# Patient Record
Sex: Female | Born: 1945 | Race: Black or African American | Hispanic: No | Marital: Single | Smoking: Current every day smoker
Health system: Southern US, Community
[De-identification: ages and names within clinical notes are randomized; demographics above are authoritative.]

## PROBLEM LIST (undated history)

## (undated) DIAGNOSIS — N189 Chronic kidney disease, unspecified: Secondary | ICD-10-CM

## (undated) DIAGNOSIS — C801 Malignant (primary) neoplasm, unspecified: Secondary | ICD-10-CM

## (undated) DIAGNOSIS — R1310 Dysphagia, unspecified: Secondary | ICD-10-CM

## (undated) DIAGNOSIS — Z5189 Encounter for other specified aftercare: Secondary | ICD-10-CM

## (undated) DIAGNOSIS — K219 Gastro-esophageal reflux disease without esophagitis: Secondary | ICD-10-CM

## (undated) DIAGNOSIS — J449 Chronic obstructive pulmonary disease, unspecified: Secondary | ICD-10-CM

## (undated) DIAGNOSIS — R0602 Shortness of breath: Secondary | ICD-10-CM

## (undated) DIAGNOSIS — IMO0001 Reserved for inherently not codable concepts without codable children: Secondary | ICD-10-CM

## (undated) DIAGNOSIS — I10 Essential (primary) hypertension: Secondary | ICD-10-CM

## (undated) DIAGNOSIS — E7800 Pure hypercholesterolemia, unspecified: Secondary | ICD-10-CM

## (undated) HISTORY — PX: MASTECTOMY: SHX3

## (undated) HISTORY — PX: BREAST SURGERY: SHX581

## (undated) HISTORY — PX: TONSILLECTOMY: SUR1361

---

## 2005-10-03 ENCOUNTER — Ambulatory Visit: Payer: Self-pay

## 2005-10-27 ENCOUNTER — Ambulatory Visit: Payer: Self-pay

## 2005-11-26 ENCOUNTER — Ambulatory Visit: Payer: Self-pay

## 2009-12-12 ENCOUNTER — Encounter: Payer: Self-pay | Admitting: Internal Medicine

## 2009-12-27 ENCOUNTER — Encounter: Payer: Self-pay | Admitting: Internal Medicine

## 2010-01-26 ENCOUNTER — Encounter: Payer: Self-pay | Admitting: Internal Medicine

## 2011-03-01 ENCOUNTER — Encounter: Payer: Self-pay | Admitting: *Deleted

## 2011-03-01 ENCOUNTER — Emergency Department (HOSPITAL_COMMUNITY): Payer: Medicaid Other

## 2011-03-01 ENCOUNTER — Inpatient Hospital Stay (HOSPITAL_COMMUNITY)
Admission: EM | Admit: 2011-03-01 | Discharge: 2011-03-03 | DRG: 390 | Disposition: A | Discharge status: Home / Self Care | Payer: Medicaid Other | Attending: Internal Medicine | Admitting: Internal Medicine

## 2011-03-01 DIAGNOSIS — Z720 Tobacco use: Secondary | ICD-10-CM | POA: Diagnosis present

## 2011-03-01 DIAGNOSIS — E785 Hyperlipidemia, unspecified: Secondary | ICD-10-CM | POA: Diagnosis present

## 2011-03-01 DIAGNOSIS — K56609 Unspecified intestinal obstruction, unspecified as to partial versus complete obstruction: Secondary | ICD-10-CM | POA: Diagnosis present

## 2011-03-01 DIAGNOSIS — F17200 Nicotine dependence, unspecified, uncomplicated: Secondary | ICD-10-CM | POA: Diagnosis present

## 2011-03-01 DIAGNOSIS — I10 Essential (primary) hypertension: Secondary | ICD-10-CM | POA: Diagnosis present

## 2011-03-01 DIAGNOSIS — E119 Type 2 diabetes mellitus without complications: Secondary | ICD-10-CM | POA: Diagnosis present

## 2011-03-01 DIAGNOSIS — K219 Gastro-esophageal reflux disease without esophagitis: Secondary | ICD-10-CM | POA: Diagnosis present

## 2011-03-01 HISTORY — DX: Shortness of breath: R06.02

## 2011-03-01 HISTORY — DX: Essential (primary) hypertension: I10

## 2011-03-01 HISTORY — DX: Gastro-esophageal reflux disease without esophagitis: K21.9

## 2011-03-01 HISTORY — DX: Pure hypercholesterolemia, unspecified: E78.00

## 2011-03-01 HISTORY — DX: Reserved for inherently not codable concepts without codable children: IMO0001

## 2011-03-01 HISTORY — DX: Encounter for other specified aftercare: Z51.89

## 2011-03-01 HISTORY — DX: Chronic kidney disease, unspecified: N18.9

## 2011-03-01 HISTORY — DX: Malignant (primary) neoplasm, unspecified: C80.1

## 2011-03-01 LAB — URINALYSIS, ROUTINE W REFLEX MICROSCOPIC
Bilirubin Urine: NEGATIVE
Glucose, UA: NEGATIVE mg/dL
Hgb urine dipstick: NEGATIVE
Ketones, ur: NEGATIVE mg/dL
Leukocytes, UA: NEGATIVE
Nitrite: NEGATIVE
Protein, ur: NEGATIVE mg/dL
Specific Gravity, Urine: 1.025 (ref 1.005–1.030)
Urobilinogen, UA: 0.2 mg/dL (ref 0.0–1.0)
pH: 6 (ref 5.0–8.0)

## 2011-03-01 LAB — COMPREHENSIVE METABOLIC PANEL
ALT: 9 U/L (ref 0–35)
AST: 17 U/L (ref 0–37)
Albumin: 4 g/dL (ref 3.5–5.2)
Alkaline Phosphatase: 96 U/L (ref 39–117)
BUN: 8 mg/dL (ref 6–23)
CO2: 20 mEq/L (ref 19–32)
Calcium: 10.1 mg/dL (ref 8.4–10.5)
Chloride: 100 mEq/L (ref 96–112)
Creatinine, Ser: 0.69 mg/dL (ref 0.50–1.10)
GFR calc Af Amer: >60 mL/min (ref >60)
GFR calc non Af Amer: >60 mL/min (ref >60)
Glucose, Bld: 135 mg/dL — ABNORMAL HIGH (ref 70–99)
Potassium: 3.8 mEq/L (ref 3.5–5.1)
Sodium: 137 mEq/L (ref 135–145)
Total Bilirubin: 0.3 mg/dL (ref 0.3–1.2)
Total Protein: 8.6 g/dL — ABNORMAL HIGH (ref 6.0–8.3)

## 2011-03-01 LAB — DIFFERENTIAL
Basophils Absolute: 0 10*3/uL (ref 0.0–0.1)
Basophils Relative: 0 % (ref 0–1)
Eosinophils Absolute: 0.1 10*3/uL (ref 0.0–0.7)
Eosinophils Relative: 1 % (ref 0–5)
Lymphocytes Relative: 34 % (ref 12–46)
Lymphs Abs: 2.8 10*3/uL (ref 0.7–4.0)
Monocytes Absolute: 0.5 10*3/uL (ref 0.1–1.0)
Monocytes Relative: 6 % (ref 3–12)
Neutro Abs: 4.8 10*3/uL (ref 1.7–7.7)
Neutrophils Relative %: 59 % (ref 43–77)

## 2011-03-01 LAB — CBC
HCT: 40.6 % (ref 36.0–46.0)
Hemoglobin: 13.8 g/dL (ref 12.0–15.0)
MCH: 29.1 pg (ref 26.0–34.0)
MCHC: 34 g/dL (ref 30.0–36.0)
MCV: 85.7 fL (ref 78.0–100.0)
Platelets: 299 10*3/uL (ref 150–400)
RBC: 4.74 MIL/uL (ref 3.87–5.11)
RDW: 13.6 % (ref 11.5–15.5)
WBC: 8.2 10*3/uL (ref 4.0–10.5)

## 2011-03-01 LAB — LIPASE, BLOOD: Lipase: 83 U/L — ABNORMAL HIGH (ref 11–59)

## 2011-03-01 LAB — LACTIC ACID, PLASMA: Lactic Acid, Venous: 0.7 mmol/L (ref 0.5–2.2)

## 2011-03-01 IMAGING — CT CT ABD-PELV W/O CM
2 of 3 series · 16 of 46 positions shown, 18 images · non-contrast
Comparison: None

CLINICAL DATA: Mid abdominal pain, history breast cancer

CT ABDOMEN AND PELVIS WITHOUT CONTRAST
TECHNIQUE: Multidetector CT imaging of the abdomen and pelvis was
performed following the standard protocol without intravenous
contrast. Sagittal and coronal MPR images reconstructed from axial
data set.

[Series 2: standard/full over (age)lbs 5.0 · axial · 0.66mm/px · z∈[-364,-24]mm · 13 of 80 slices shown, 15 images]
[im 6/80  soft-tissue]
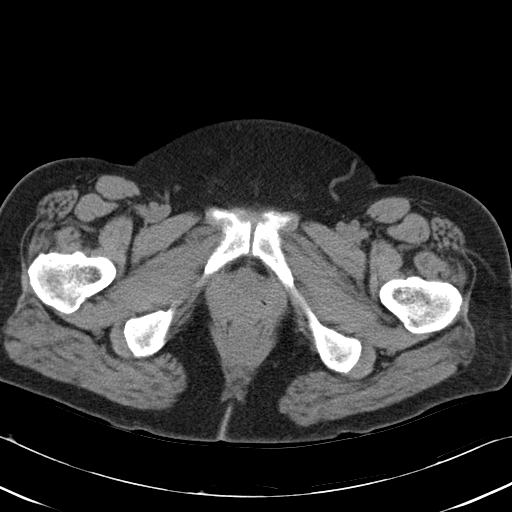
[im 6/80  bone]
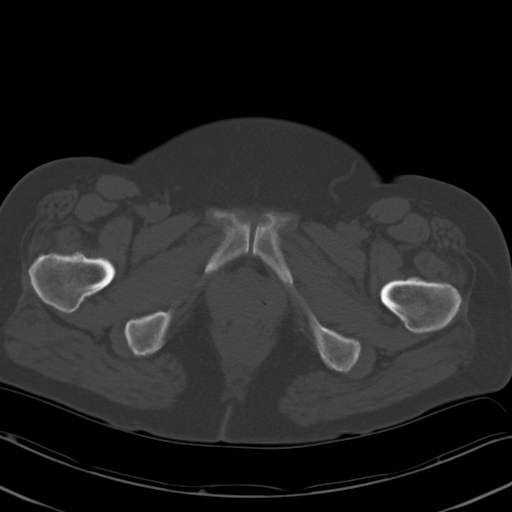
[im 11/80  soft-tissue]
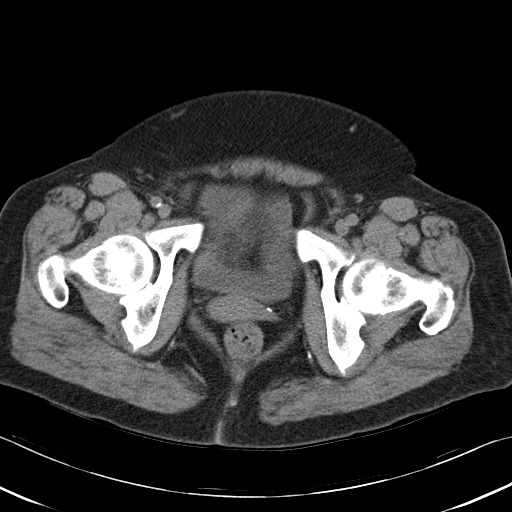
[im 16/80  soft-tissue]
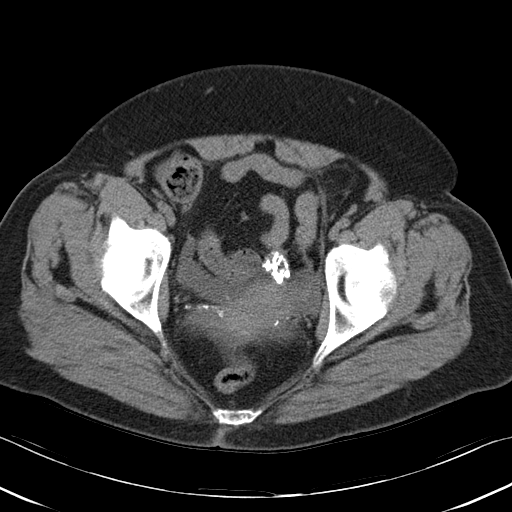
[im 23/80  soft-tissue]
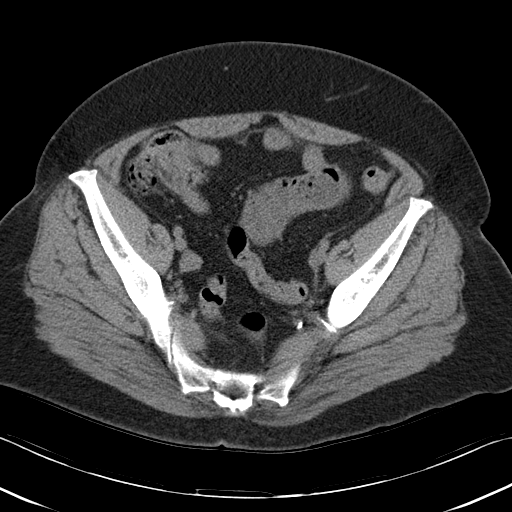
[im 29/80  soft-tissue]
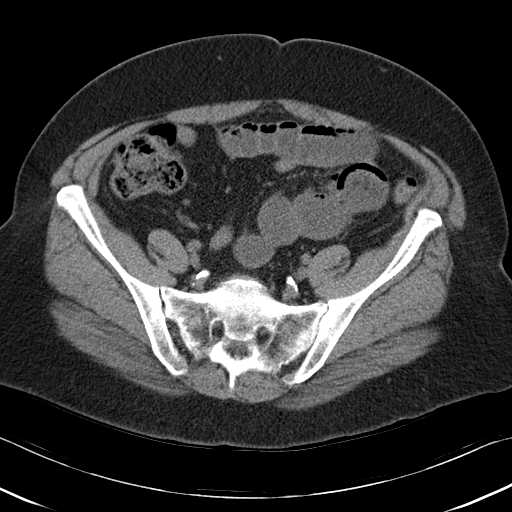
[im 34/80  soft-tissue]
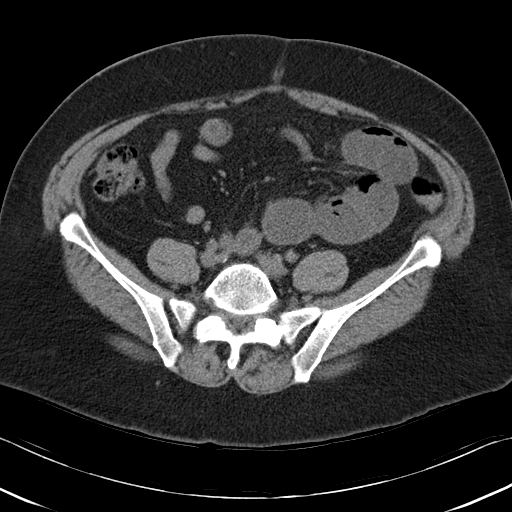
[im 41/80  soft-tissue]
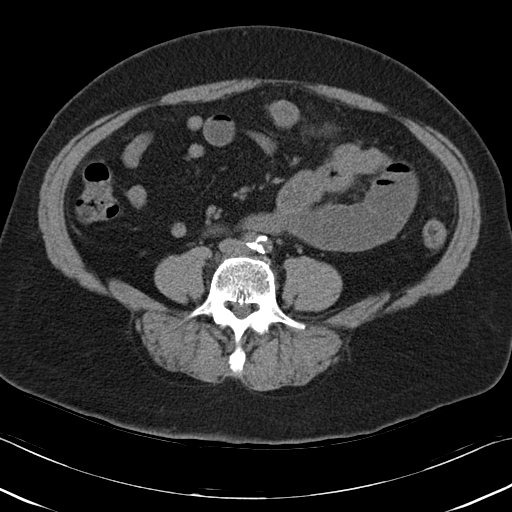
[im 46/80  soft-tissue]
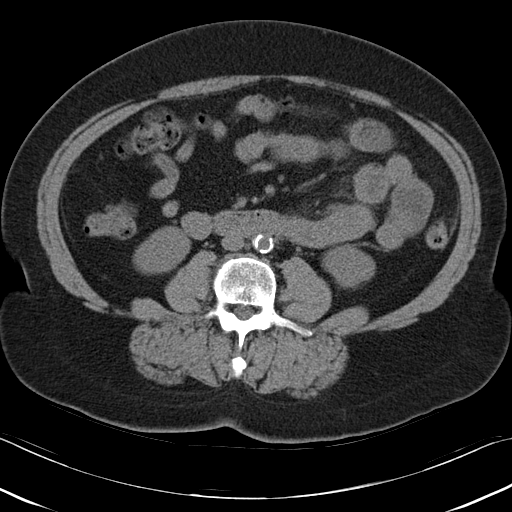
[im 51/80  soft-tissue]
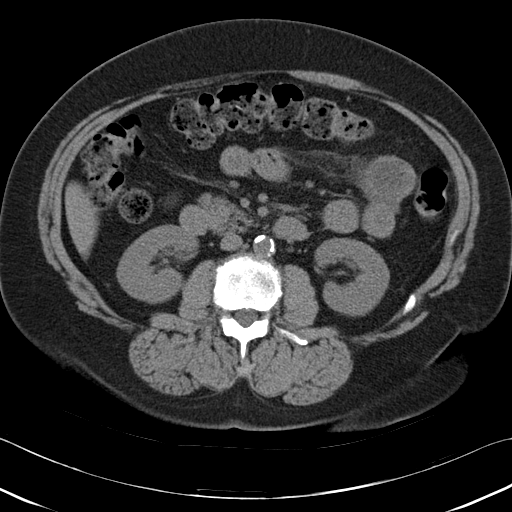
[im 51/80  bone]
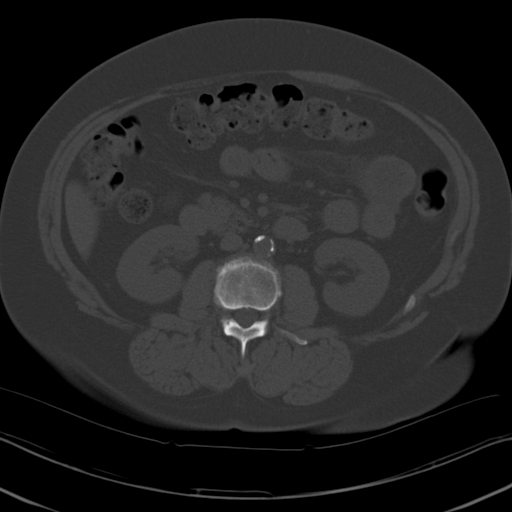
[im 57/80  soft-tissue]
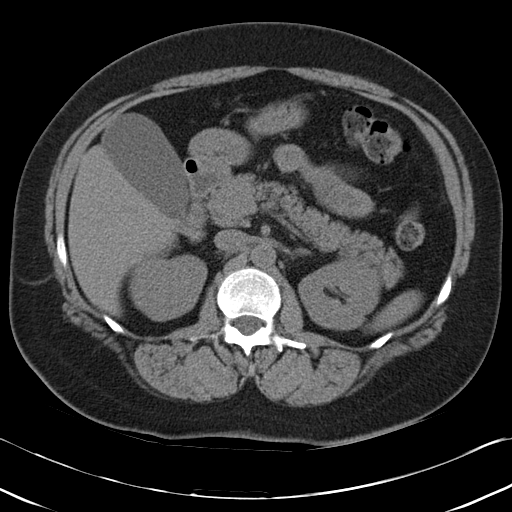
[im 64/80  soft-tissue]
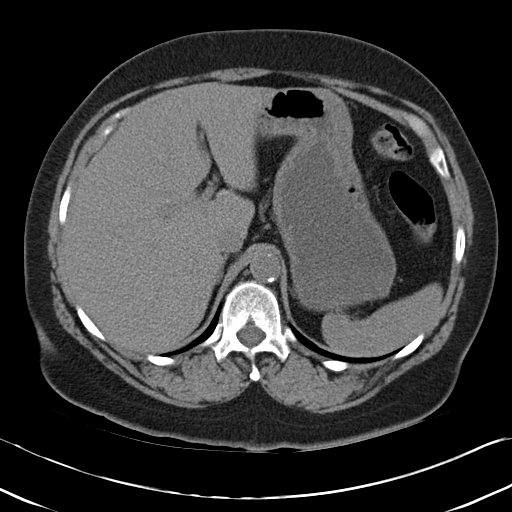
[im 69/80  soft-tissue]
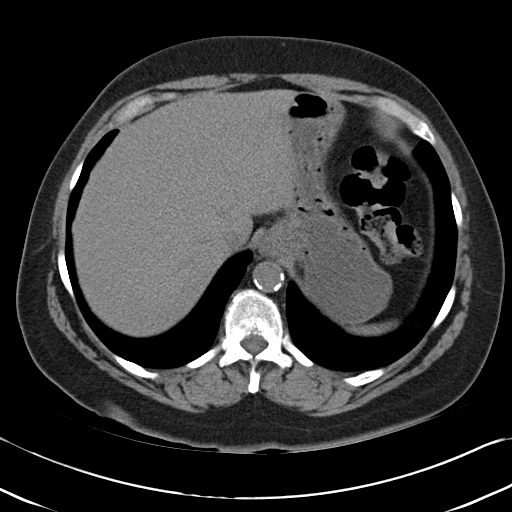
[im 74/80  soft-tissue]
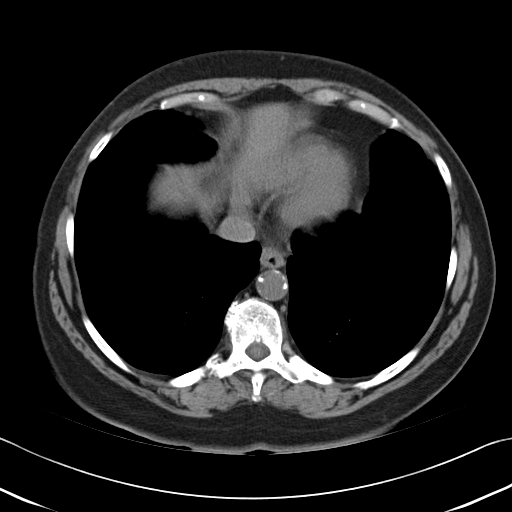

[Series 4: mpr coronal · coronal · 0.78mm/px · 3 of 86 slices shown]
[im 29/86  soft-tissue]
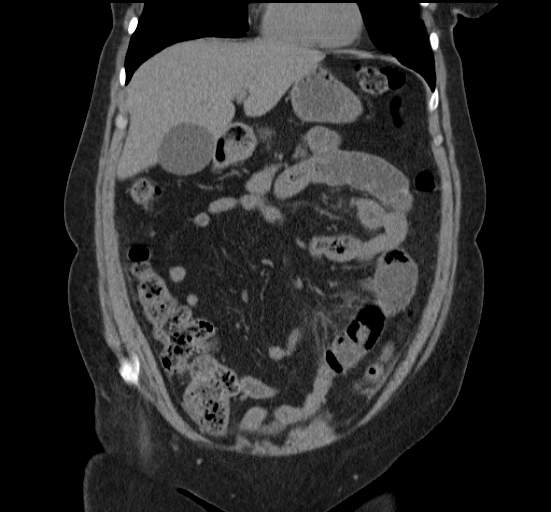
[im 38/86  soft-tissue]
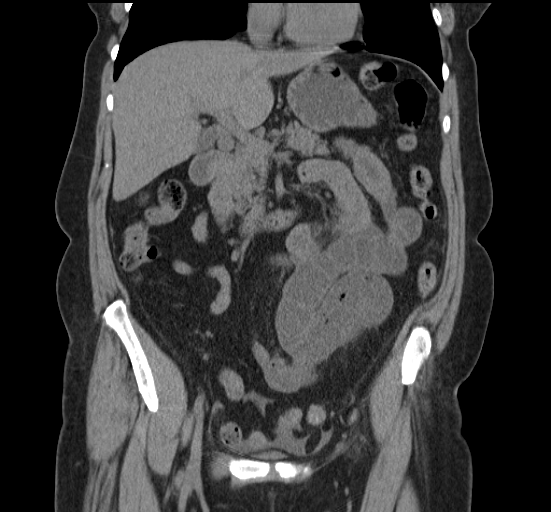
[im 48/86  soft-tissue]
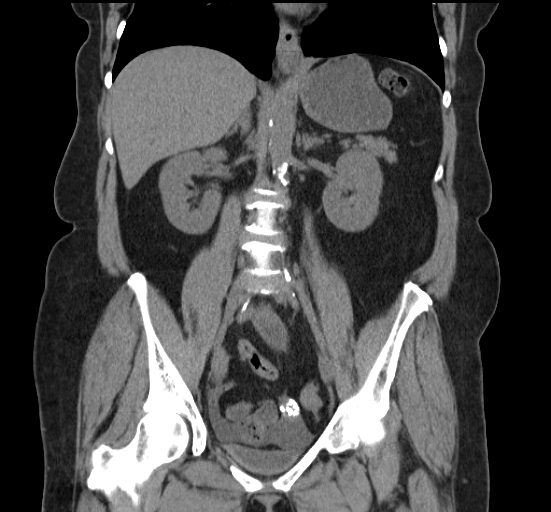

[16 of 46 positions shown; findings below may reference images not displayed]

FINDINGS: Post right mastectomy.
Linear scarring left lung base.
Scattered atherosclerotic calcifications aorta, iliac arteries,
left renal artery.
Small hiatal hernia.
Within limits of a nonenhanced exam, no focal abnormalities of the
liver, spleen, pancreas, kidneys, or adrenal glands.
Appendix, colon, and remainder stomach unremarkable.
Dilated small bowel loops in the left mid abdomen and upper left
pelvis, up to 3.3 cm diameter.
Several of the involved small bowel loops show peri-intestinal
mesenteric edema.
Findings are most compatible with small bowel obstruction, proximal
to mid small bowel, with distal small bowel loops normal
caliber/decompressed.
Exophytic calcified leiomyoma from left superior aspect of uterus,
1.9 cm diameter.
Unremarkable bladder.
Nonspecific free pelvic fluid.
No additional mass, adenopathy, or hernia.
No acute osseous findings.
IMPRESSION: Dilated small bowel loops in left mid abdomen and upper pelvis with
adjacent edema in the small bowel mesentery.
Findings are most consistent with small bowel obstruction, though I
do not see evidence of prior abdominal surgery to account for
adhesions as etiology.
Other etiologies could include internal hernia, closed loop
obstruction, hernia, or small bowel volvulus, though I do not see
specific evidence of these.
Small bowel ischemia could cause a similar pattern and should also
be considered; the patient does demonstrates extensive
atherosclerotic calcification in the abdominal aorta.
Nonspecific free pelvic fluid without evidence of free
air/perforation/abscess.
Small hiatal hernia.

Critical Value/emergent results were discussed in person at the
time of interpretation on [DATE]  at [HB] hours  with  Dr.
BLONDINACKA, who verbally acknowledged these results.

## 2011-03-01 MED ORDER — HYDROMORPHONE HCL 1 MG/ML IJ SOLN
INTRAMUSCULAR | Status: AC
  Filled 2011-03-01: qty 1

## 2011-03-01 MED ORDER — ENOXAPARIN SODIUM 80 MG/0.8ML ~~LOC~~ SOLN
40.0000 mg | SUBCUTANEOUS | Status: DC
  Administered 2011-03-02: 40 mg via SUBCUTANEOUS

## 2011-03-01 MED ORDER — HYDROMORPHONE HCL 1 MG/ML IJ SOLN
1.0000 mg | Freq: Once | INTRAMUSCULAR | Status: AC
  Administered 2011-03-01: 1 mg via INTRAVENOUS
  Filled 2011-03-01: qty 1

## 2011-03-01 MED ORDER — PANTOPRAZOLE SODIUM 40 MG IV SOLR
40.0000 mg | INTRAVENOUS | Status: DC
  Administered 2011-03-02: 40 mg via INTRAVENOUS

## 2011-03-01 MED ORDER — SODIUM CHLORIDE 0.9 % IV SOLN
999.0000 mL | INTRAVENOUS | Status: DC
  Administered 2011-03-01 (×2): via INTRAVENOUS

## 2011-03-01 MED ORDER — ONDANSETRON HCL 4 MG/2ML IJ SOLN
4.0000 mg | Freq: Three times a day (TID) | INTRAMUSCULAR | Status: DC | PRN
  Administered 2011-03-02 (×2): 4 mg via INTRAVENOUS
  Filled 2011-03-01 (×2): qty 2

## 2011-03-01 MED ORDER — ONDANSETRON HCL 4 MG/2ML IJ SOLN
4.0000 mg | Freq: Once | INTRAMUSCULAR | Status: AC
  Administered 2011-03-01: 4 mg via INTRAVENOUS
  Filled 2011-03-01: qty 2

## 2011-03-01 MED ORDER — HYDROMORPHONE HCL 1 MG/ML IJ SOLN
1.0000 mg | INTRAMUSCULAR | Status: DC | PRN
  Administered 2011-03-02 (×4): 1 mg via INTRAVENOUS
  Filled 2011-03-01 (×3): qty 1

## 2011-03-01 MED ORDER — SODIUM CHLORIDE 0.9 % IV BOLUS (SEPSIS): 1000.0000 mL | Freq: Once | INTRAVENOUS | Status: DC

## 2011-03-01 MED ORDER — MORPHINE SULFATE 2 MG/ML IJ SOLN
1.0000 mg | INTRAMUSCULAR | Status: DC | PRN
  Administered 2011-03-02 (×2): 1 mg via INTRAVENOUS
  Filled 2011-03-01 (×2): qty 1

## 2011-03-01 MED ORDER — SODIUM CHLORIDE 0.9 % IV SOLN
INTRAVENOUS | Status: DC
  Administered 2011-03-02: 18:00:00 via INTRAVENOUS

## 2011-03-01 MED ORDER — INSULIN ASPART 100 UNIT/ML ~~LOC~~ SOLN
0.0000 [IU] | SUBCUTANEOUS | Status: DC
  Filled 2011-03-01: qty 10

## 2011-03-01 MED ORDER — NICOTINE 21 MG/24HR TD PT24
21.0000 mg | MEDICATED_PATCH | Freq: Every day | TRANSDERMAL | Status: DC
  Administered 2011-03-02 – 2011-03-03 (×2): 21 mg via TRANSDERMAL
  Filled 2011-03-01 (×2): qty 1

## 2011-03-01 MED ORDER — SODIUM CHLORIDE 0.9 % IV SOLN
INTRAVENOUS | Status: DC
  Administered 2011-03-02: 08:00:00 via INTRAVENOUS

## 2011-03-01 MED ORDER — HYDROMORPHONE HCL 1 MG/ML IJ SOLN
1.0000 mg | Freq: Once | INTRAMUSCULAR | Status: AC
  Administered 2011-03-01: 1 mg via INTRAVENOUS

## 2011-03-01 NOTE — ED Notes (Signed)
Pt. Reports abd pain returning-rates 7/10; EDP notified and orders rec'd; medicated as ordered; Hospitalist at bedside to examine.

## 2011-03-01 NOTE — ED Notes (Signed)
C/o onset of diffuse, intermittent abd pain last night associated with n/v; reports pain, "just hurts", and rates as 9/10; a&ox4; answers questions appropriately; IV access obtained and labs drawn; NS infusing w/o difficulty at 523ml/hr; medicated as ordered for pain and nausea.

## 2011-03-01 NOTE — ED Provider Notes (Signed)
History     CSN: [id number] Arrival date & time: 03/01/2011  3:52 PM  Chief Complaint  Patient presents with  . Abdominal Pain  . Emesis   Patient is a 65 y.o. female presenting with abdominal pain and vomiting. The history is provided by the patient.  Abdominal Pain The primary symptoms of the illness include abdominal pain and vomiting. The current episode started yesterday. The onset of the illness was gradual. Progression since onset: Pain is waxing and waning. Pain is rated at 9/10 currently, but was 10/10 at it's worst.  The emesis contains stomach contents.  Change in bowel habit: She ahs been able to pass a small amount of flatus, but no bowel movement since onset of pain. Additional symptoms associated with the illness include frequency and back pain. Associated symptoms comments: She has no history of abdominal surgery..  Emesis  Associated symptoms include abdominal pain.    Past Medical History  Diagnosis Date  . Diabetes mellitus   . Cancer   . High cholesterol     Past Surgical History  Procedure Date  . Mastectomy right side  . Tonsillectomy     No family history on file.  History  Substance Use Topics  . Smoking status: Current Everyday Smoker  . Smokeless tobacco: Not on file  . Alcohol Use: No    OB History    Grav Para Term Preterm Abortions TAB SAB Ect Mult Living                  Review of Systems  Gastrointestinal: Positive for vomiting and abdominal pain.  Genitourinary: Positive for frequency.  Musculoskeletal: Positive for back pain.  All other systems reviewed and are negative.    Physical Exam  BP 148/73  Pulse 120  Temp(Src) 98.7 F (37.1 C) (Oral)  Resp 20  Ht 5' (1.524 m)  Wt 146 lb (66.225 kg)  BMI 28.51 kg/m2  SpO2 98%  Physical Exam  Constitutional: She is oriented to person, place, and time. She appears well-developed and well-nourished. No distress.  HENT:  Head: Normocephalic and atraumatic.  Right Ear: External  ear normal.  Left Ear: External ear normal.  Nose: Nose normal.  Mouth/Throat: Oropharynx is clear and moist.  Eyes: EOM are normal. Pupils are equal, round, and reactive to light. No scleral icterus.  Neck: Normal range of motion. Neck supple. No JVD present.  Cardiovascular: Normal rate, regular rhythm and normal heart sounds.   Pulmonary/Chest: Effort normal and breath sounds normal. She has no wheezes. She has no rales.       Right mastectomy.  Abdominal: Soft. There is tenderness. There is no rebound and no guarding.       Moderate suprapubic tenderness without rebound or guarding. Peristalsis is decreased.  Musculoskeletal: Normal range of motion. She exhibits no edema.  Lymphadenopathy:    She has no cervical adenopathy.  Neurological: She is alert and oriented to person, place, and time. No cranial nerve deficit. Coordination normal.  Skin: Skin is warm and dry. No rash noted.  Psychiatric: She has a normal mood and affect.    ED Course  Procedures  MDM Lower abdominal pain - possible UTI, possible diverticulitis, possible SBO.  CT shows SBO. Results discussed with radiologist, images viewed by me. Findings discussed with the patient who confirms no history of abdominal surgery including no laparoscopic procedures and no Caesarian Section.  Pain is completely relieved by Dilaudid 1 mg. Dr. Rito Ehrlich consulted for admission, Dr. Leticia Penna consulted  for surgical opinion.  Labs and x-rays reviewed. CT scan images viewed by me, discussed with radiologist. Results for orders placed during the hospital encounter of 03/01/11  CBC      Component Value Range   WBC 8.2  4.0 - 10.5 (K/uL)   RBC 4.74  3.87 - 5.11 (MIL/uL)   Hemoglobin 13.8  12.0 - 15.0 (g/dL)   HCT 16.1  09.6 - 04.5 (%)   MCV 85.7  78.0 - 100.0 (fL)   MCH 29.1  26.0 - 34.0 (pg)   MCHC 34.0  30.0 - 36.0 (g/dL)   RDW 40.9  81.1 - 91.4 (%)   Platelets 299  150 - 400 (K/uL)  DIFFERENTIAL      Component Value Range    Neutrophils Relative 59  43 - 77 (%)   Neutro Abs 4.8  1.7 - 7.7 (K/uL)   Lymphocytes Relative 34  12 - 46 (%)   Lymphs Abs 2.8  0.7 - 4.0 (K/uL)   Monocytes Relative 6  3 - 12 (%)   Monocytes Absolute 0.5  0.1 - 1.0 (K/uL)   Eosinophils Relative 1  0 - 5 (%)   Eosinophils Absolute 0.1  0.0 - 0.7 (K/uL)   Basophils Relative 0  0 - 1 (%)   Basophils Absolute 0.0  0.0 - 0.1 (K/uL)  COMPREHENSIVE METABOLIC PANEL      Component Value Range   Sodium 137  135 - 145 (mEq/L)   Potassium 3.8  3.5 - 5.1 (mEq/L)   Chloride 100  96 - 112 (mEq/L)   CO2 20  19 - 32 (mEq/L)   Glucose, Bld 135 (*) 70 - 99 (mg/dL)   BUN 8  6 - 23 (mg/dL)   Creatinine, Ser 7.82  0.50 - 1.10 (mg/dL)   Calcium 95.6  8.4 - 10.5 (mg/dL)   Total Protein 8.6 (*) 6.0 - 8.3 (g/dL)   Albumin 4.0  3.5 - 5.2 (g/dL)   AST 17  0 - 37 (U/L)   ALT 9  0 - 35 (U/L)   Alkaline Phosphatase 96  39 - 117 (U/L)   Total Bilirubin 0.3  0.3 - 1.2 (mg/dL)   GFR calc non Af Amer >60  >60 (mL/min)   GFR calc Af Amer >60  >60 (mL/min)  LIPASE, BLOOD      Component Value Range   Lipase 83 (*) 11 - 59 (U/L)  URINALYSIS, ROUTINE W REFLEX MICROSCOPIC      Component Value Range   Color, Urine YELLOW  YELLOW    Appearance CLEAR  CLEAR    Specific Gravity, Urine 1.025  1.005 - 1.030    pH 6.0  5.0 - 8.0    Glucose, UA NEGATIVE  NEGATIVE (mg/dL)   Hgb urine dipstick NEGATIVE  NEGATIVE    Bilirubin Urine NEGATIVE  NEGATIVE    Ketones, ur NEGATIVE  NEGATIVE (mg/dL)   Protein, ur NEGATIVE  NEGATIVE (mg/dL)   Urobilinogen, UA 0.2  0.0 - 1.0 (mg/dL)   Nitrite NEGATIVE  NEGATIVE    Leukocytes, UA NEGATIVE  NEGATIVE   LACTIC ACID, PLASMA      Component Value Range   Lactic Acid, Venous 0.7  0.5 - 2.2 (mmol/L)   Ct Abdomen Pelvis Wo Contrast  03/01/2011  *RADIOLOGY REPORT*  Clinical Data: Mid abdominal pain, history breast cancer  CT ABDOMEN AND PELVIS WITHOUT CONTRAST  Technique:  Multidetector CT imaging of the abdomen and pelvis was performed  following the standard protocol without intravenous contrast.  Sagittal and coronal MPR images reconstructed from axial data set.  Comparison: None  Findings: Post right mastectomy. Linear scarring left lung base. Scattered atherosclerotic calcifications aorta, iliac arteries, left renal artery. Small hiatal hernia. Within limits of a nonenhanced exam, no focal abnormalities of the liver, spleen, pancreas, kidneys, or adrenal glands. Appendix, colon, and remainder stomach unremarkable. Dilated small bowel loops in the left mid abdomen and upper left pelvis, up to 3.3 cm diameter. Several of the involved small bowel loops show peri-intestinal mesenteric edema. Findings are most compatible with small bowel obstruction, proximal to mid small bowel, with distal small bowel loops normal caliber/decompressed. 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 Unremarkable bladder. Nonspecific free pelvic fluid. No additional mass, adenopathy, or hernia. No acute osseous findings.  IMPRESSION: Dilated small bowel loops in left mid abdomen and upper pelvis with adjacent edema in the small bowel mesentery. Findings are most consistent with small bowel obstruction, though I do not see evidence of prior abdominal surgery to account for adhesions as etiology. Other etiologies could include internal hernia, closed loop obstruction, hernia, or small bowel volvulus, though I do not see specific evidence of these. Small bowel ischemia could cause a similar pattern and should also be considered; the patient does demonstrates extensive atherosclerotic calcification in the abdominal aorta. Nonspecific free pelvic fluid without evidence of free air/perforation/abscess. Small hiatal hernia.  Critical Value/emergent results were discussed in person at the time of interpretation on 03/01/2011  at 1715 hours  with  Dr. Preston Fleeting, who verbally acknowledged these results.  Original Report Authenticated By: Lollie Marrow, M.D.      Dione Booze, MD 03/01/11 1929

## 2011-03-01 NOTE — ED Notes (Signed)
Lower abd pain with vomiting that started last night with decreased appetite.  Pt states last normal BM x 2 days ago.  Denies fever.

## 2011-03-01 NOTE — ED Notes (Signed)
Alert, oriented x 4; in no distress; continues to deny abd pain

## 2011-03-01 NOTE — ED Notes (Signed)
In and out cath charted in error. Urine specimen was obtained, but pt was able to ambulate to the bathroom and obtain a clean catch urine specimen.

## 2011-03-01 NOTE — H&P (Signed)
PCP:   Patient goes to [facility] and sees a Dr.Clegg (sp?)  Chief Complaint:  Abdominal pain  HPI: Patient is a 65 year old Afro-American female past medical history of diabetes and hypertension who has never had any previous abdominal surgery but presents with a one-day history of worsening abdominal pain with associated nausea vomiting and constipation. It got to the point where the pain and she became so distended she came to the emergency room.  CT scan noted a small bowel obstruction.    Review of Systems:  The patient denies anorexia, fever, weight loss,, vision loss, decreased hearing, hoarseness, chest pain, syncope, dyspnea on exertion, peripheral edema, balance deficits, hemoptysis, melena, hematochezia, severe indigestion/heartburn, hematuria, incontinence, genital sores, muscle weakness, suspicious skin lesions, transient blindness, difficulty walking, depression, unusual weight change, abnormal bleeding, enlarged lymph nodes, angioedema, and breast masses.  Review of systems positive as per history of present illness in that she complains of abdominal pain although much improved after pain medication. She complains of some mild nausea.  Past Medical History: Past Medical History  Diagnosis Date  . Diabetes mellitus   . Cancer   . High cholesterol    GERD Tobacco abuse  Past Surgical History  Procedure Date  . Mastectomy right side  . Tonsillectomy     Medications: Prior to Admission medications   Medication Sig Start Date End Date Taking? Authorizing Provider  metFORMIN (GLUCOPHAGE) 500 MG tablet Take 1,000 mg by mouth 2 (two) times daily with a meal.     Yes Historical Provider, MD    Allergies:  No Known Allergies  Social History:  reports that she has been smoking Cigarettes.  She has been smoking about 1 pack per day. She does not have any smokeless tobacco history on file. She reports that she does not drink alcohol or use illicit  drugs.  Family History: Diabetes and hypertension  Physical Exam: Filed Vitals:   03/01/11 1545 03/01/11 1948  BP: 148/73 118/42  Pulse: 120 82  Temp: 98.7 F (37.1 C)   TempSrc: Oral   Resp: 20 18  Height: 5' (1.524 m)   Weight: 66.225 kg (146 lb)   SpO2: 98% 97%   General appearance: alert, cooperative, appears stated age and no distress Head: Normocephalic, without obvious abnormality, atraumatic Resp: clear to auscultation bilaterally Chest wall: no tenderness Cardio: regular rate and rhythm, S1, S2 normal, no murmur, click, rub or gallop GI: Soft, distended, few high-pitched bowel sounds, minimal generalized tenderness. Extremities: extremities normal, atraumatic, no cyanosis or edema Pulses: 2+ and symmetric Skin: Skin color, texture, turgor normal. No rashes or lesions   Labs on Admission:   [facility] 03/01/11 1614  NA 137  K 3.8  CL 100  CO2 20  GLUCOSE 135*  BUN 8  CREATININE 0.69  CALCIUM 10.1  MG --  PHOS --    Basename 03/01/11 1614  AST 17  ALT 9  ALKPHOS 96  BILITOT 0.3  PROT 8.6*  ALBUMIN 4.0    Basename 03/01/11 1614  LIPASE 83*  AMYLASE --    Basename 03/01/11 1614  WBC 8.2  NEUTROABS 4.8  HGB 13.8  HCT 40.6  MCV 85.7  PLT 299    Radiological Exams on Admission: Ct Abdomen Pelvis Wo Contrast  03/01/2011   IMPRESSION: Dilated small bowel loops in left mid abdomen and upper pelvis with adjacent edema in the small bowel mesentery. Findings are most consistent with small bowel obstruction, though I do not see evidence of prior  abdominal surgery to account for adhesions as etiology. Other etiologies could include internal hernia, closed loop obstruction, hernia, or small bowel volvulus, though I do not see specific evidence of these. Small bowel ischemia could cause a similar pattern and should also be considered; the patient does demonstrates extensive atherosclerotic calcification in the abdominal aorta. Nonspecific free pelvic fluid  without evidence of free air/perforation/abscess. Small hiatal hernia.  Critical Value/emergent results were discussed in person at the time of interpretation on 03/01/2011  at 1715 hours  with  Dr. Preston Fleeting, who verbally acknowledged these results.  Original Report Authenticated By: Lollie Marrow, M.D.    Assessment/Plan Present on Admission:  .SBO (small bowel obstruction): IV Protonix, n.p.o., pain and nausea control. Check followup abdominal x-ray in the morning. Interestingly, patient's lipase is elevated. We'll recheck this as well.   Marland KitchenHTN (hypertension)-stable  .DM2 (diabetes mellitus, type 2)-n.p.o. and sliding scale.  Marland KitchenHyperlipemia-stable.  .Tobacco abuse-nicotine patch  .GERD (gastroesophageal reflux disease)-IV Protonix.  Hollice Espy 03/01/2011, 8:24 PM

## 2011-03-02 ENCOUNTER — Inpatient Hospital Stay (HOSPITAL_COMMUNITY): Payer: Medicaid Other

## 2011-03-02 LAB — GLUCOSE, CAPILLARY
Glucose-Capillary: 100 mg/dL — ABNORMAL HIGH (ref 70–99)
Glucose-Capillary: 96 mg/dL (ref 70–99)
Glucose-Capillary: 87 mg/dL (ref 70–99)
Glucose-Capillary: 91 mg/dL (ref 70–99)

## 2011-03-02 LAB — BASIC METABOLIC PANEL
BUN: 8 mg/dL (ref 6–23)
CO2: 21 mEq/L (ref 19–32)
Calcium: 9 mg/dL (ref 8.4–10.5)
Chloride: 103 mEq/L (ref 96–112)
Creatinine, Ser: 0.57 mg/dL (ref 0.50–1.10)
GFR calc Af Amer: >60 mL/min (ref >60)
GFR calc non Af Amer: >60 mL/min (ref >60)
Glucose, Bld: 124 mg/dL — ABNORMAL HIGH (ref 70–99)
Potassium: 4 mEq/L (ref 3.5–5.1)
Sodium: 137 mEq/L (ref 135–145)

## 2011-03-02 LAB — LIPASE, BLOOD: Lipase: 146 U/L — ABNORMAL HIGH (ref 11–59)

## 2011-03-02 IMAGING — CR DG ABDOMEN ACUTE W/ 1V CHEST
3 series · 3 of 3 positions shown · non-contrast
Comparison: Abdominal CT [DATE].

CLINICAL DATA: Abdominal pain.  Follow-up small bowel obstruction.
History of breast cancer.

ACUTE ABDOMEN SERIES (ABDOMEN 2 VIEW & CHEST 1 VIEW)

[view not recorded (1 of 3)]
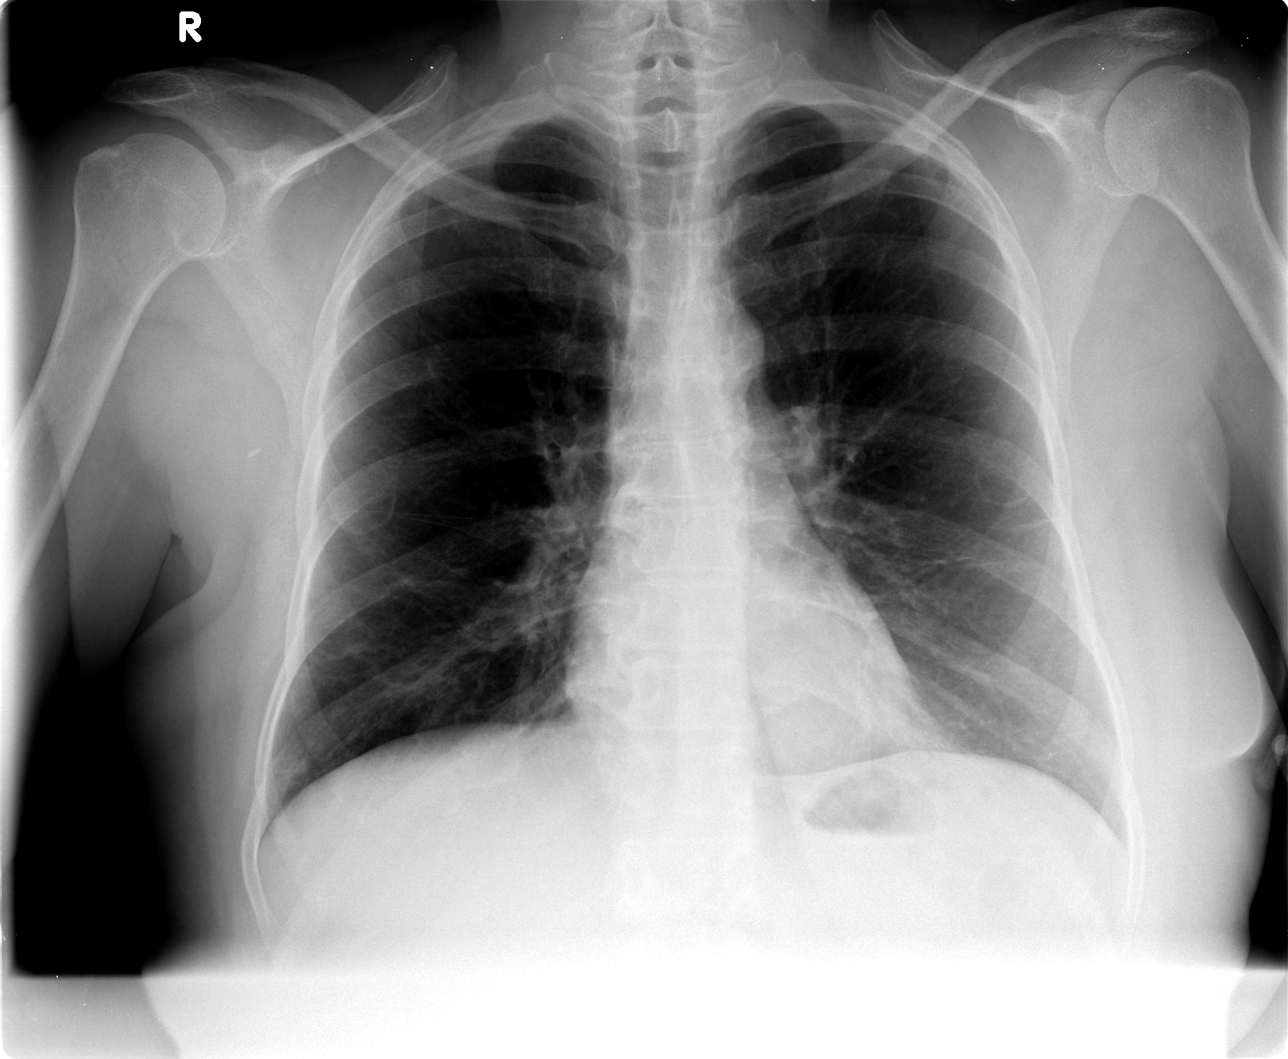

[view not recorded (2 of 3)]
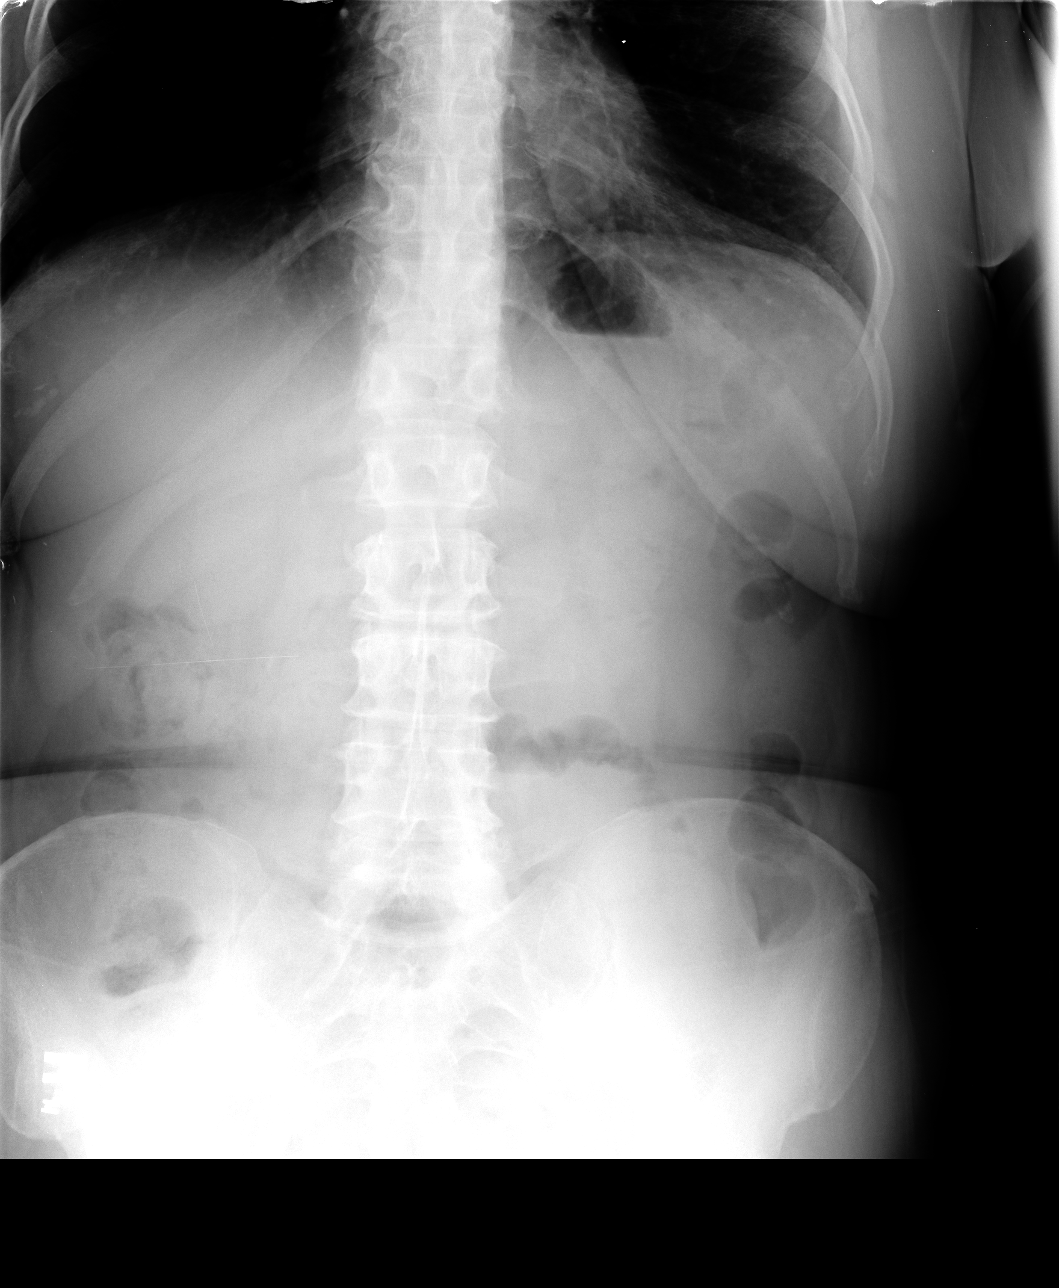

[view not recorded (3 of 3)]
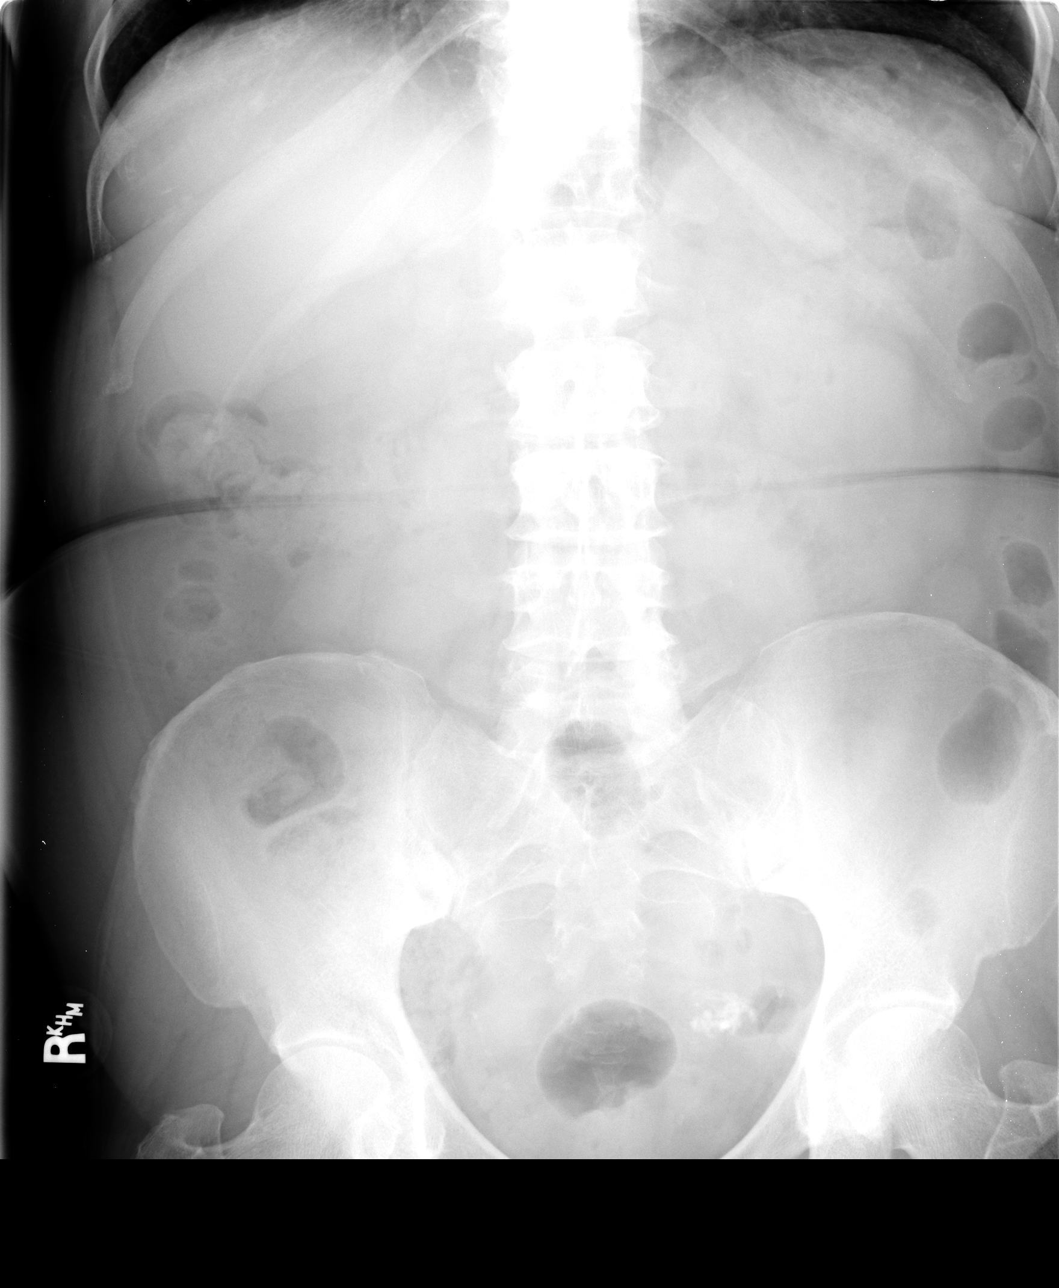

[3 of 3 positions shown; findings below may reference images not displayed]

FINDINGS: The heart size and mediastinal contours are normal.  The
lungs are clear.  There is no pleural effusion or pneumothorax.

Dilated small bowel in the left abdomen appears improved.  No
suspicious air fluid levels are demonstrated on the erect
examination.  There is no free intraperitoneal air.  Degenerated
uterine fibroid is noted.
IMPRESSION: Improved partial small bowel obstruction.  No acute cardiopulmonary
process.

## 2011-03-02 NOTE — Progress Notes (Signed)
Subjective: Patient feeling a little bit better. Still some abdominal distention and pain. She had episode of nausea and vomiting overnight and again in the morning. No other complaints.  Objective: Weight change:   Intake/Output Summary (Last 24 hours) at 03/02/11 1344 Last data filed at 03/02/11 0800  Gross per 24 hour  Intake      0 ml  Output      0 ml  Net      0 ml   BP 108/52  Pulse 67  Temp(Src) 98.3 F (36.8 C) (Oral)  Resp 16  Ht 5' (1.524 m)  Wt 66.225 kg (146 lb)  BMI 28.51 kg/m2  SpO2 96% General appearance: alert, appears stated age, no distress and mildly obese Lungs: clear to auscultation bilaterally Heart: regular rate and rhythm, S1, S2 normal, no murmur, click, rub or gallop Abdomen: Distended, few bowel sounds although improved from previous day. Minimal generalized tenderness. Extremities: extremities normal, atraumatic, no cyanosis or edema  Lab Results: Basic Metabolic Panel:  Basename 03/02/11 0541 03/01/11 1614  NA 137 137  K 4.0 3.8  CL 103 100  CO2 21 20  GLUCOSE 124* 135*  BUN 8 8  CREATININE 0.57 0.69  CALCIUM 9.0 10.1  MG -- --  PHOS -- --   Liver Function Tests:  [facility] 03/01/11 1614  AST 17  ALT 9  ALKPHOS 96  BILITOT 0.3  PROT 8.6*  ALBUMIN 4.0    Basename 03/02/11 0541 03/01/11 1614  LIPASE 146* 83*  AMYLASE -- --   CBC:  Basename 03/01/11 1614  WBC 8.2  NEUTROABS 4.8  HGB 13.8  HCT 40.6  MCV 85.7  PLT 299   Basename 03/02/11 1121 03/02/11 0720  GLUCAP 96 100*   Studies/Results:   Acute Abdominal Series  03/02/2011    IMPRESSION: Improved partial small bowel obstruction.  No acute cardiopulmonary process.  Original Report Authenticated By: Gerrianne Scale, M.D.   Medications: Scheduled Meds:   . enoxaparin  40 mg Subcutaneous Q24H  . HYDROmorphone      . HYDROmorphone  1 mg Intravenous Once  . HYDROmorphone  1 mg Intravenous Once  . insulin aspart  0-9 Units Subcutaneous Q4H  . nicotine  21 mg  Transdermal Daily  . ondansetron  4 mg Intravenous Once  . pantoprazole (PROTONIX) IV  40 mg Intravenous Q24H  . DISCONTD: sodium chloride  1,000 mL Intravenous Once   Continuous Infusions:   . sodium chloride 100 mL/hr at 03/02/11 0745  . sodium chloride 100 mL/hr at 03/01/11 2030  . DISCONTD: sodium chloride 100 mL/hr at 03/01/11 2017   PRN Meds:.HYDROmorphone (DILAUDID) injection, morphine, ondansetron (ZOFRAN) IV  Assessment/Plan: Patient Active Hospital Problem List: SBO (small bowel obstruction) (03/01/2011)  slow improvement, continue n.p.o. and encourage ambulation. Pain and nausea control. Followup film in the morning.  HTN (hypertension) (03/01/2011)  stable.  DM2 (diabetes mellitus, type 2) (03/01/2011)  CBG stable.  Hyperlipemia (03/01/2011)  stable.  Tobacco abuse (03/01/2011)  on nicotine patch.  GERD (gastroesophageal reflux disease) (03/01/2011)  IV PPI.    LOS: 1 day   Jonael Paradiso K 03/02/2011, 1:44 PM

## 2011-03-03 ENCOUNTER — Inpatient Hospital Stay (HOSPITAL_COMMUNITY): Payer: Medicaid Other

## 2011-03-03 LAB — GLUCOSE, CAPILLARY
Glucose-Capillary: 147 mg/dL — ABNORMAL HIGH (ref 70–99)
Glucose-Capillary: 58 mg/dL — ABNORMAL LOW (ref 70–99)
Glucose-Capillary: 72 mg/dL (ref 70–99)
Glucose-Capillary: 78 mg/dL (ref 70–99)
Glucose-Capillary: 85 mg/dL (ref 70–99)
Glucose-Capillary: 90 mg/dL (ref 70–99)

## 2011-03-03 MED ORDER — DEXTROSE 50 % IV SOLN
INTRAVENOUS | Status: AC
  Administered 2011-03-03: 25 mL
  Filled 2011-03-03: qty 50

## 2011-03-03 NOTE — Progress Notes (Signed)
IV removed, site WNL.  Pt given d/c instructions.  Discussed home care with patient and discussed home medications, patient verbalizes undstanding. F/U appointments to be made by pt when md office open, pt states they will keep appts. Pt taken to main entrance in wheelchair by staff member.

## 2011-03-03 NOTE — Discharge Summary (Signed)
Discharge Summary  Michelle Henry  MR#: [id number]  DOB:[birth year]  Date of Admission: 03/01/2011 Date of Discharge: 03/03/2011  Attending Physician:Kellie Murrill K  Patient's PCP: Dr.Elin Claggett of [facility] in [place]  Discharge Diagnoses: SBO (small bowel obstruction) .HTN (hypertension) .DM2 (diabetes mellitus, type 2) .Hyperlipemia .Tobacco abuse .GERD (gastroesophageal reflux disease)    Discharge Medications Current Discharge Medication List    CONTINUE these medications which have NOT CHANGED   Details  metFORMIN (GLUCOPHAGE) 500 MG tablet Take 1,000 mg by mouth 2 (two) times daily with a meal.          Hospital Course: SBO (small bowel obstruction): Patient is a 65 year old Afro-American female with past medical history of diabetes and hypertension who presented to the emergency room with one-day complaint of nausea vomiting and generalized abdominal pain. She has no previous history of abdominal surgeries. CT scan of abdomen noted signs consistent with a small bowel obstruction. Her lipase level was elevated but her pancreas was normal on CT. Lipase levels fell to be elevated secondary to the small bowel obstruction itself. Patient was made n.p.o. by mouth on IV fluids and given medicine for pain and nausea. Hospital day 2 she started some nausea and vomiting but her x-ray noted a decrease in her small bowel traction. By hospital day 3 followup x-ray noted for was [place] small bowel obstruction. Patient's diet was advanced and she was able to tolerate solid foods. She is being discharged home today.  Marland KitchenHTN (hypertension)-stable during this hospitalization.  Marland KitchenDM2 (diabetes mellitus, type 2)-patient was made n.p.o. and put on a sliding scale. She and one episode of hypoglycemia on the morning of hospital day 3. Since her diet was advanced her CBGs are stabilized.   .Hyperlipemia-stable.  .Tobacco abuse-counseled to quit, she was put on nicotine patch while  in hospital.  .GERD (gastroesophageal reflux disease) stable.  Day of Discharge BP 110/61  Pulse 73  Temp(Src) 98 F (36.7 C) (Oral)  Resp 16  Ht 5' (1.524 m)  Wt 72.3 kg (159 lb 6.3 oz)  BMI 31.13 kg/m2  SpO2 90%  Physical Exam: General appearance: alert, appears stated age, no distress and mildly obese  Lungs: clear to auscultation bilaterally  Heart: regular rate and rhythm, S1, S2 normal, no murmur, click, rub or gallop  Abdomen: Soft, minimal distention, nontender, increased bowel sounds.  Follow-up Appts: Discharge Orders    Future Orders Please Complete By Expires   Diet - low sodium heart healthy      Diet Carb Modified      Increase activity slowly        patient will follow up with her PCP at [facility] in 3 weeks.   SignedHollice Espy 03/03/2011, 1:55 PM

## 2011-03-03 NOTE — Progress Notes (Signed)
CBG checked at 0725, found to be 58. Pt has no signs of hypoglycemia besides feeling weak. Gave pt have an amp of D50 through IV at 0733. Will recheck BG.

## 2011-03-08 NOTE — Progress Notes (Signed)
Encounter addended by: Ree Shay, RN on: 03/08/2011  1:33 PM<BR>     Documentation filed: Charges VN

## 2011-03-28 NOTE — Progress Notes (Signed)
Encounter addended by: Clarene Critchley on: 03/28/2011  8:34 AM<BR>     Documentation filed: Flowsheet VN

## 2011-10-29 DIAGNOSIS — R1310 Dysphagia, unspecified: Secondary | ICD-10-CM | POA: Insufficient documentation

## 2012-07-07 IMAGING — CR DG ABDOMEN 2V
2 series · 2 of 2 positions shown · non-contrast
Comparison: Yesterday

CLINICAL DATA: Small bowel obstruction

ABDOMEN - 2 VIEW

[view not recorded (1 of 2)]
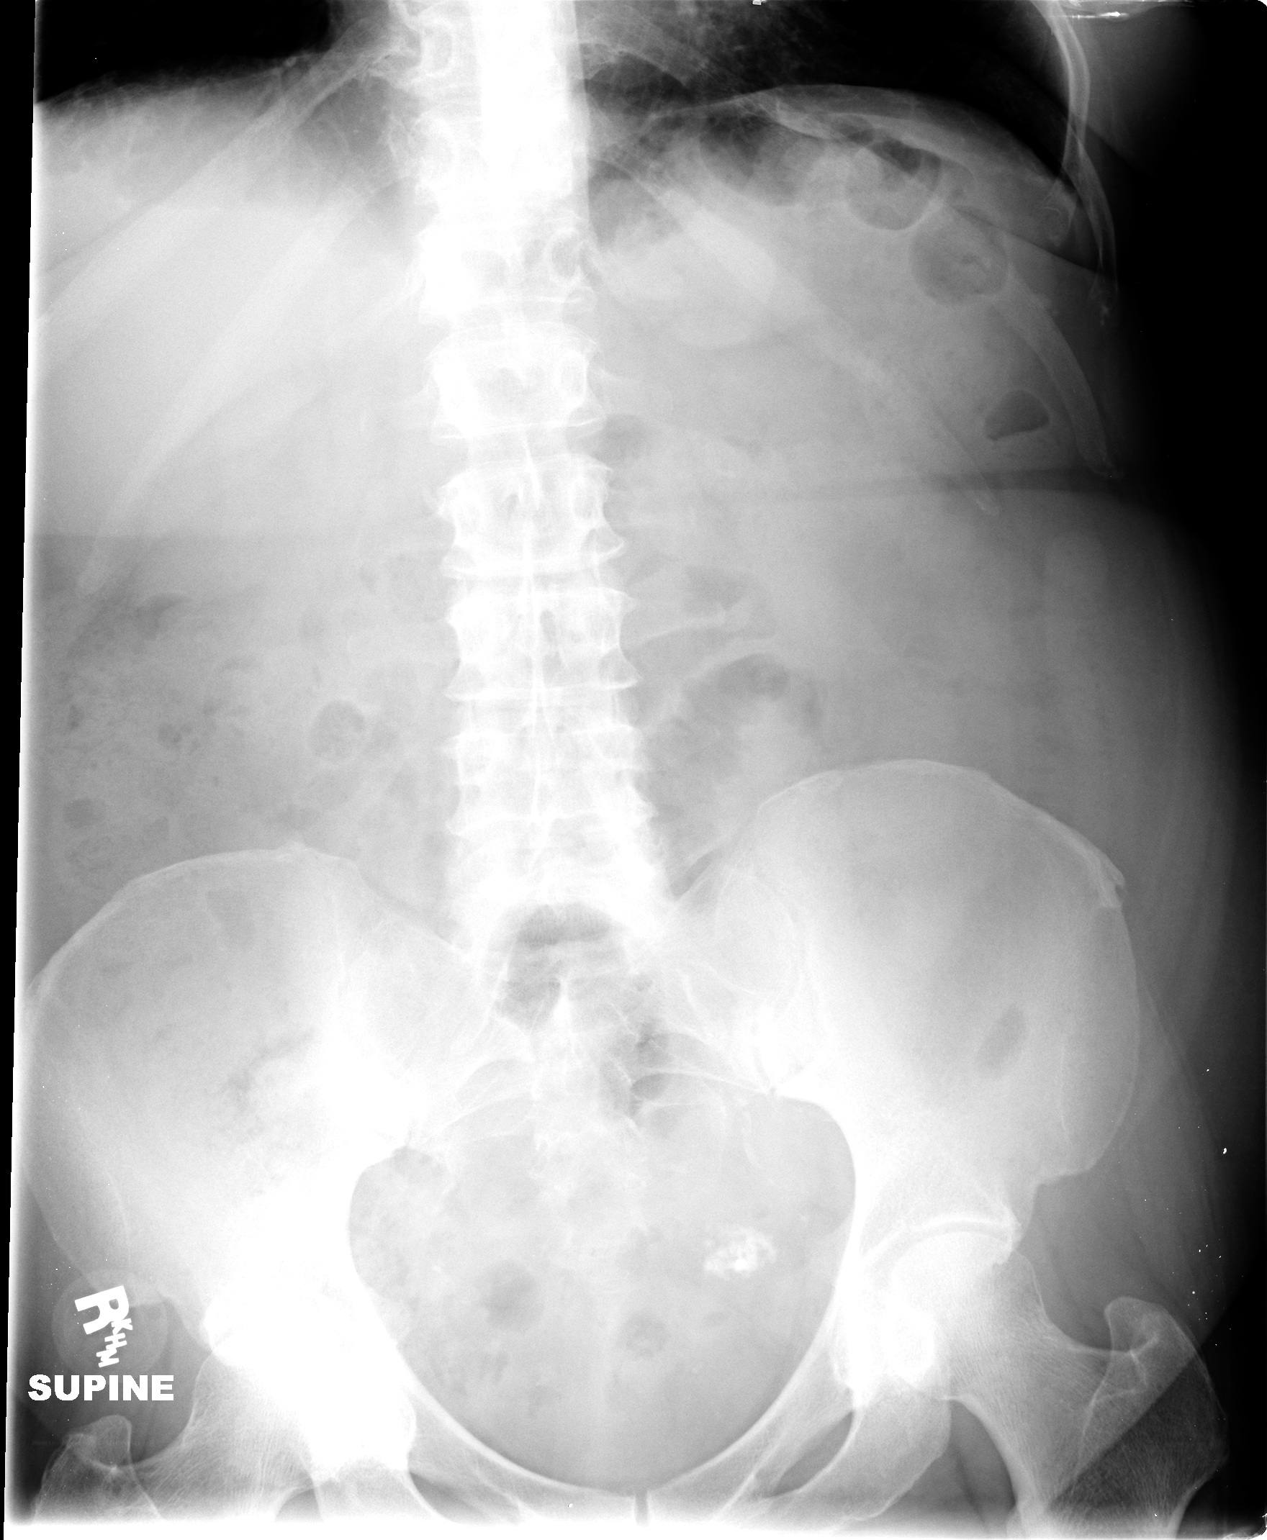

[view not recorded (2 of 2)]
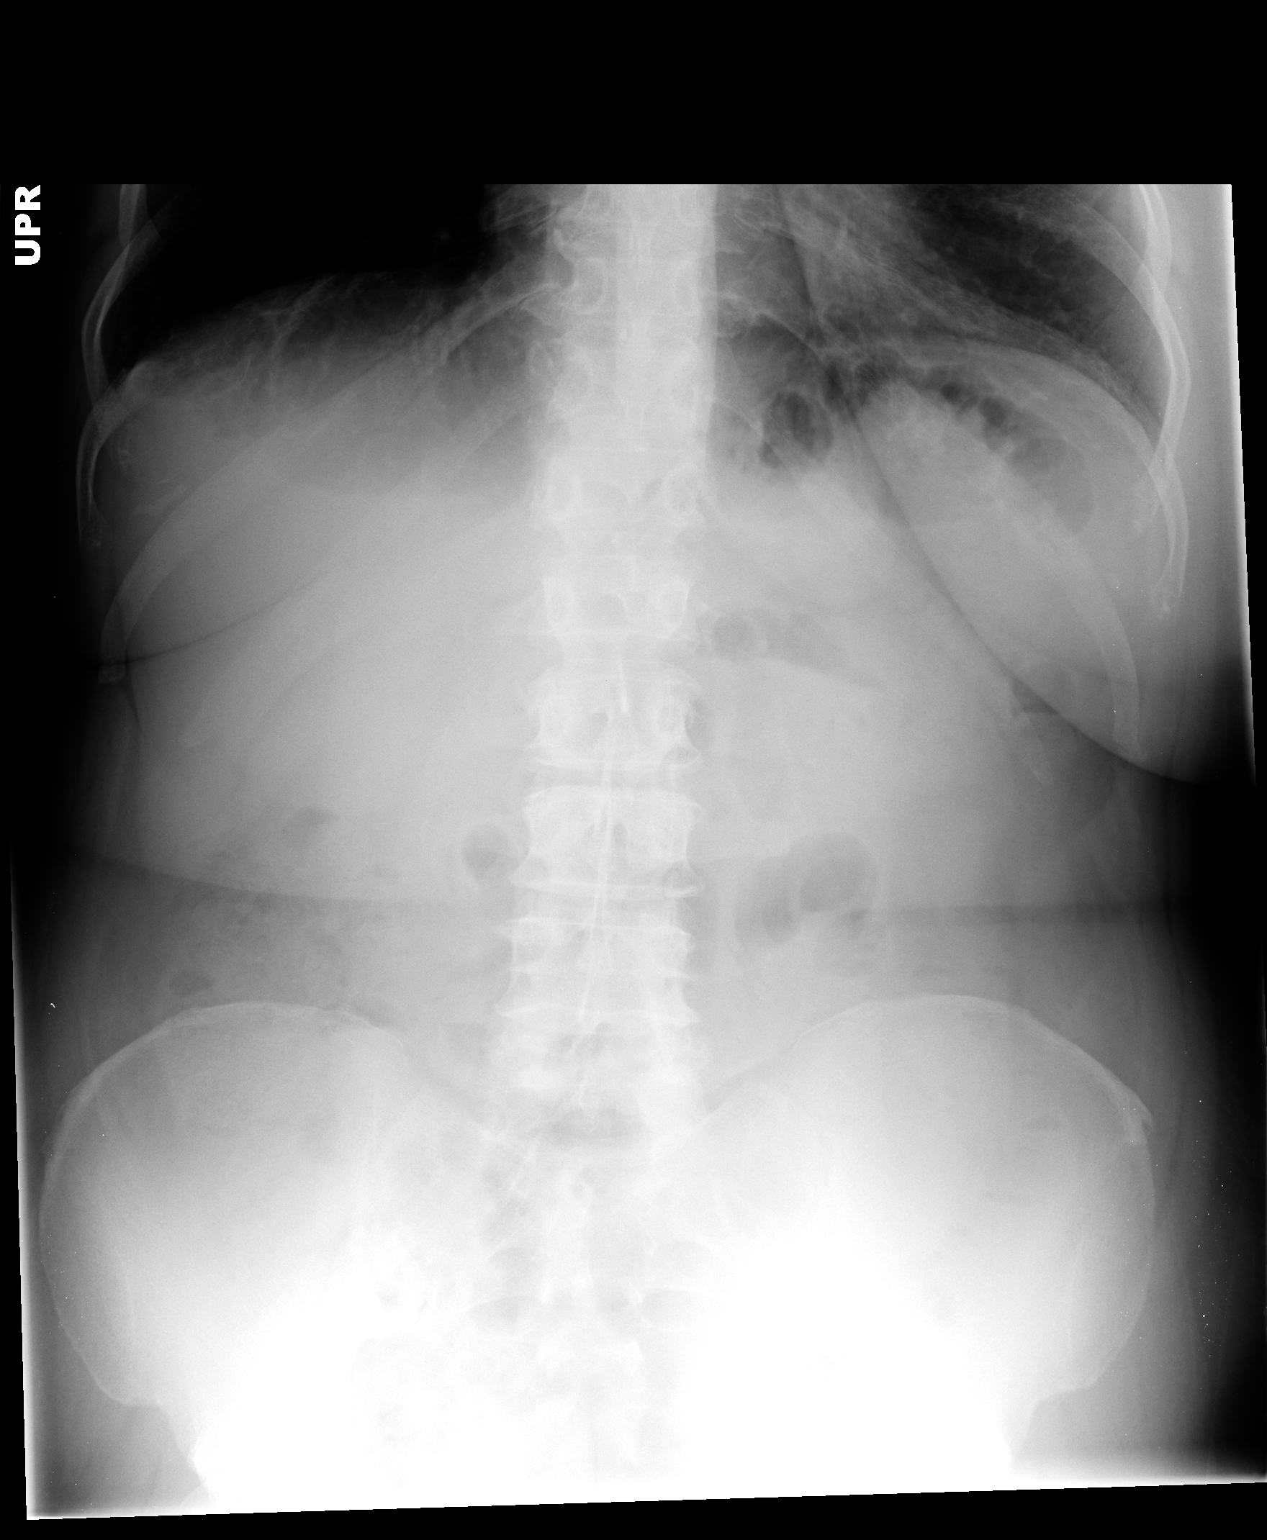

[2 of 2 positions shown; findings below may reference images not displayed]

FINDINGS: No disproportionate dilatation of bowel.  No free
intraperitoneal gas.  Unremarkable soft tissues.
IMPRESSION: Nonobstructive bowel gas pattern.

## 2012-11-26 HISTORY — PX: ESOPHAGOGASTRODUODENOSCOPY (EGD) WITH ESOPHAGEAL DILATION: SHX5812

## 2012-12-25 DIAGNOSIS — K222 Esophageal obstruction: Secondary | ICD-10-CM | POA: Insufficient documentation

## 2012-12-25 DIAGNOSIS — J449 Chronic obstructive pulmonary disease, unspecified: Secondary | ICD-10-CM | POA: Insufficient documentation

## 2014-10-28 ENCOUNTER — Encounter (HOSPITAL_COMMUNITY): Payer: Self-pay

## 2014-10-28 ENCOUNTER — Emergency Department (HOSPITAL_COMMUNITY): Payer: Medicare HMO

## 2014-10-28 ENCOUNTER — Emergency Department (HOSPITAL_COMMUNITY)
Admission: EM | Admit: 2014-10-28 | Discharge: 2014-10-28 | Disposition: A | Discharge status: Home / Self Care | Payer: Medicare HMO | Attending: Emergency Medicine | Admitting: Emergency Medicine

## 2014-10-28 DIAGNOSIS — I129 Hypertensive chronic kidney disease with stage 1 through stage 4 chronic kidney disease, or unspecified chronic kidney disease: Secondary | ICD-10-CM | POA: Insufficient documentation

## 2014-10-28 DIAGNOSIS — Z79899 Other long term (current) drug therapy: Secondary | ICD-10-CM | POA: Insufficient documentation

## 2014-10-28 DIAGNOSIS — J441 Chronic obstructive pulmonary disease with (acute) exacerbation: Secondary | ICD-10-CM | POA: Insufficient documentation

## 2014-10-28 DIAGNOSIS — Z859 Personal history of malignant neoplasm, unspecified: Secondary | ICD-10-CM | POA: Insufficient documentation

## 2014-10-28 DIAGNOSIS — Z720 Tobacco use: Secondary | ICD-10-CM | POA: Diagnosis not present

## 2014-10-28 DIAGNOSIS — E119 Type 2 diabetes mellitus without complications: Secondary | ICD-10-CM | POA: Diagnosis not present

## 2014-10-28 DIAGNOSIS — N189 Chronic kidney disease, unspecified: Secondary | ICD-10-CM | POA: Insufficient documentation

## 2014-10-28 DIAGNOSIS — Z8719 Personal history of other diseases of the digestive system: Secondary | ICD-10-CM | POA: Insufficient documentation

## 2014-10-28 DIAGNOSIS — R0602 Shortness of breath: Secondary | ICD-10-CM | POA: Diagnosis present

## 2014-10-28 HISTORY — DX: Chronic obstructive pulmonary disease, unspecified: J44.9

## 2014-10-28 HISTORY — DX: Dysphagia, unspecified: R13.10

## 2014-10-28 LAB — I-STAT CHEM 8, ED
BUN: 7 mg/dL (ref 6–23)
Calcium, Ion: 1.16 mmol/L (ref 1.13–1.30)
Chloride: 105 mmol/L (ref 96–112)
Creatinine, Ser: 0.9 mg/dL (ref 0.50–1.10)
Glucose, Bld: 118 mg/dL — ABNORMAL HIGH (ref 70–99)
HCT: 43 % (ref 36.0–46.0)
Hemoglobin: 14.6 g/dL (ref 12.0–15.0)
Potassium: 3.6 mmol/L (ref 3.5–5.1)
Sodium: 143 mmol/L (ref 135–145)
TCO2: 23 mmol/L (ref 0–100)

## 2014-10-28 LAB — I-STAT TROPONIN, ED: Troponin i, poc: 0 ng/mL (ref 0.00–0.08)

## 2014-10-28 IMAGING — CT CT ANGIO CHEST
2 of 6 series · 6 of 36 positions shown · IV contrast (Omnipaque 300)
Comparison: None.

CLINICAL DATA: Shortness of breath for 3 weeks with chest pain on
the left for several days

EXAM:
CT ANGIOGRAPHY CHEST WITH CONTRAST
TECHNIQUE: Multidetector CT imaging of the chest was performed using the
standard protocol during bolus administration of intravenous
contrast. Multiplanar CT image reconstructions and MIPs were
obtained to evaluate the vascular anatomy.
CONTRAST:  100mL OMNIPAQUE IOHEXOL 350 MG/ML SOLN

[Series 5: pe 3.0 b40f · axial · 0.64mm/px · z∈[+992,+1172]mm · 5 of 90 slices shown]
[im 15/90  lung]
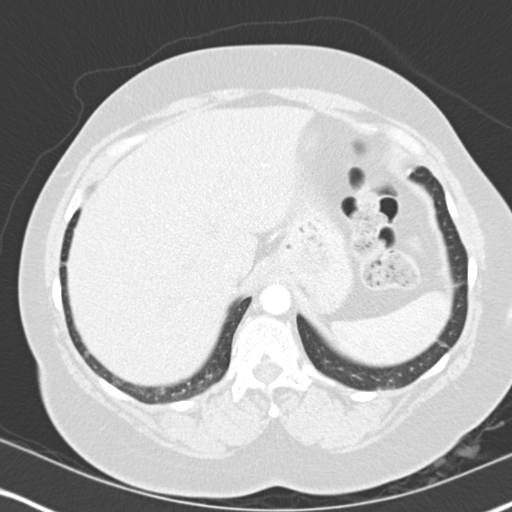
[im 30/90  mediastinal]
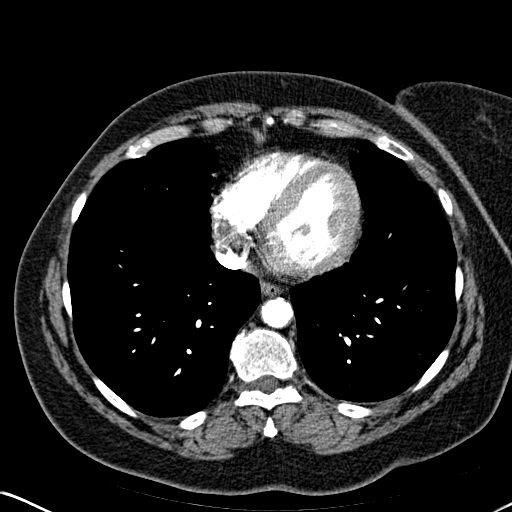
[im 45/90  lung]
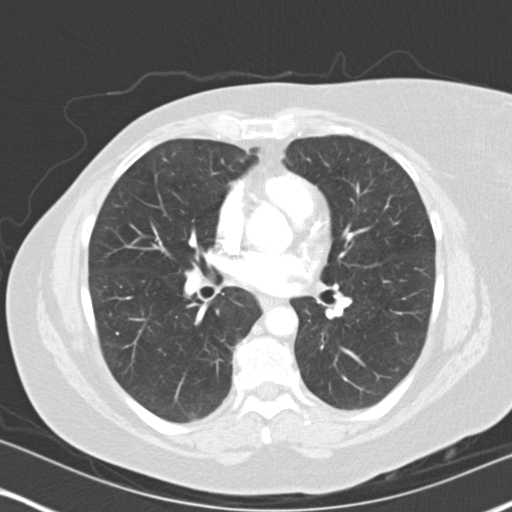
[im 60/90  mediastinal]
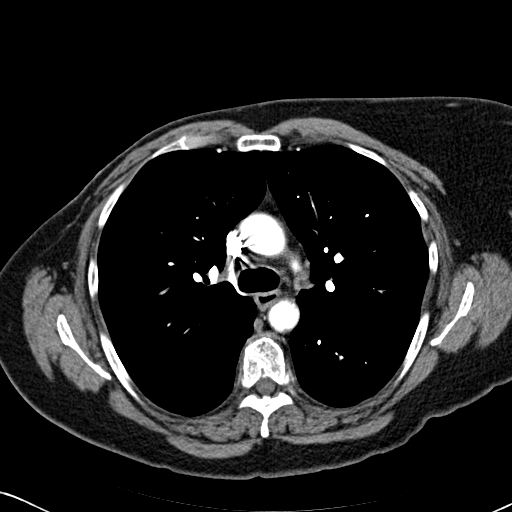
[im 75/90  lung]
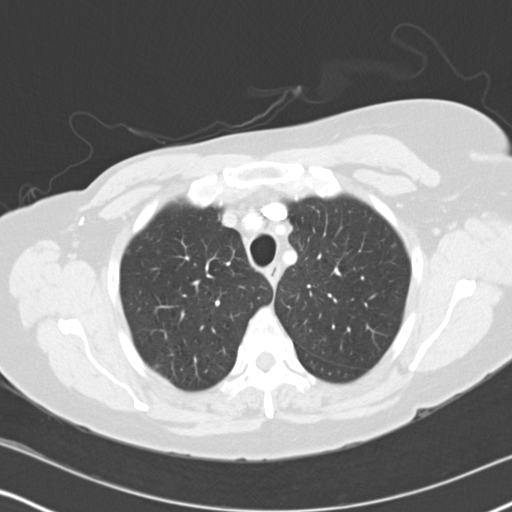

[Series 7: mpr coronal pe 3mm · coronal · 0.54mm/px · 1 of 86 slices shown]
[im 43/86  mediastinal]
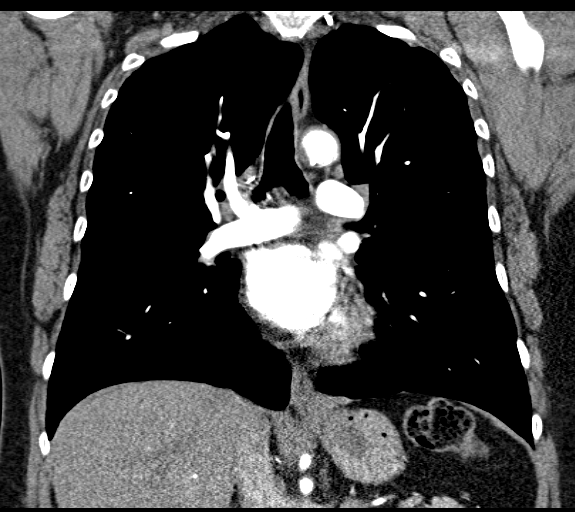

[6 of 36 positions shown; findings below may reference images not displayed]

FINDINGS: Lungs are well aerated bilaterally without focal infiltrate or
sizable effusion. No parenchymal nodules are seen.

The thoracic inlet is within normal limits. The thoracic aorta
demonstrates some mild calcific change although no aneurysmal
dilatation or dissection is seen. The left anterior descending
coronary artery demonstrates calcification. The pulmonary artery is
well visualized and demonstrates a normal branching pattern. No
filling defect to suggest pulmonary embolism is identified. No
significant hilar or mediastinal adenopathy is seen.

Scanning into the upper abdomen reveals no acute abnormality.
Visualized bony structures show degenerative changes of the thoracic
spine.

Review of the MIP images confirms the above findings.
IMPRESSION: No evidence of pulmonary embolism.

No acute abnormality is identified.

## 2014-10-28 IMAGING — DX DG CHEST 2V
2 series · 2 of 2 positions shown · non-contrast
Comparison: [DATE] .  [DATE].

CLINICAL DATA: Shortness of breath.

EXAM:
CHEST  2 VIEW

[chest pa]
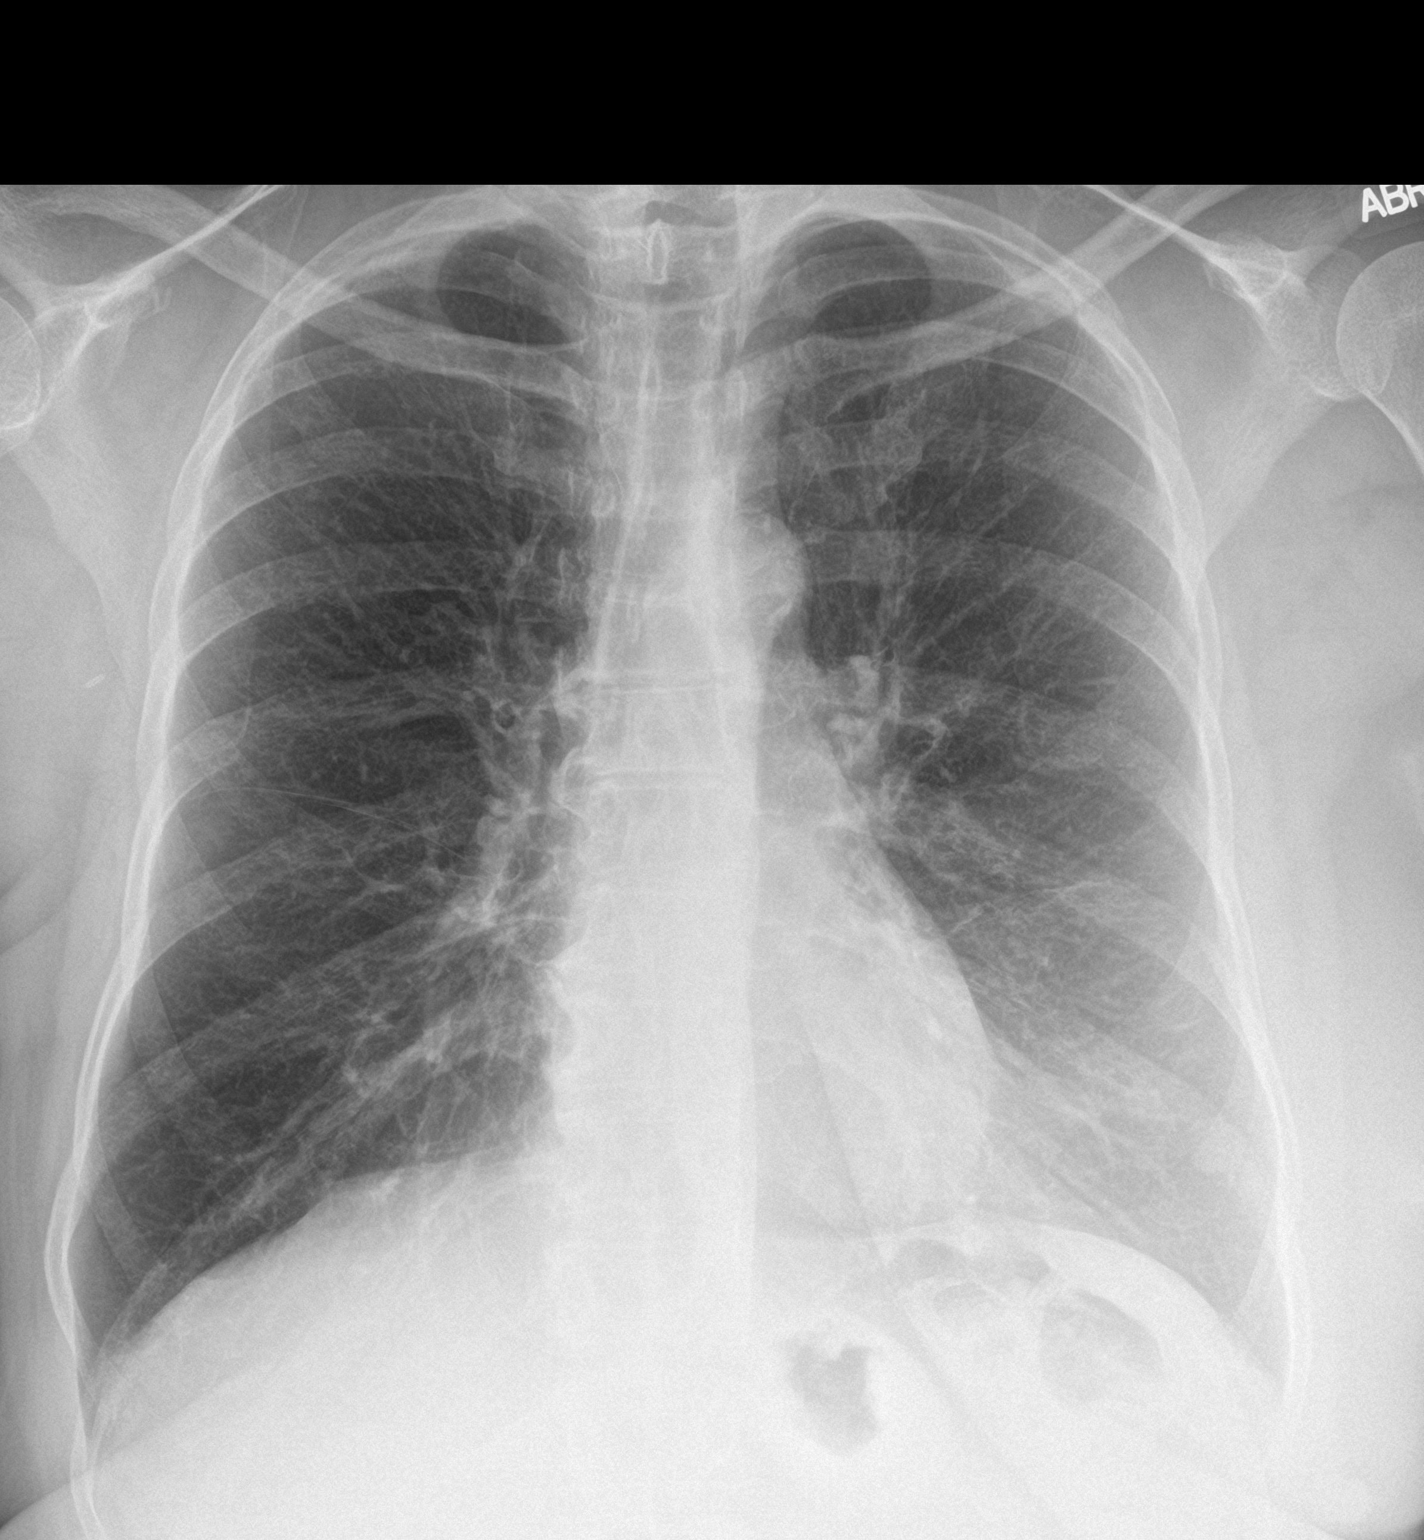

[chest lat]
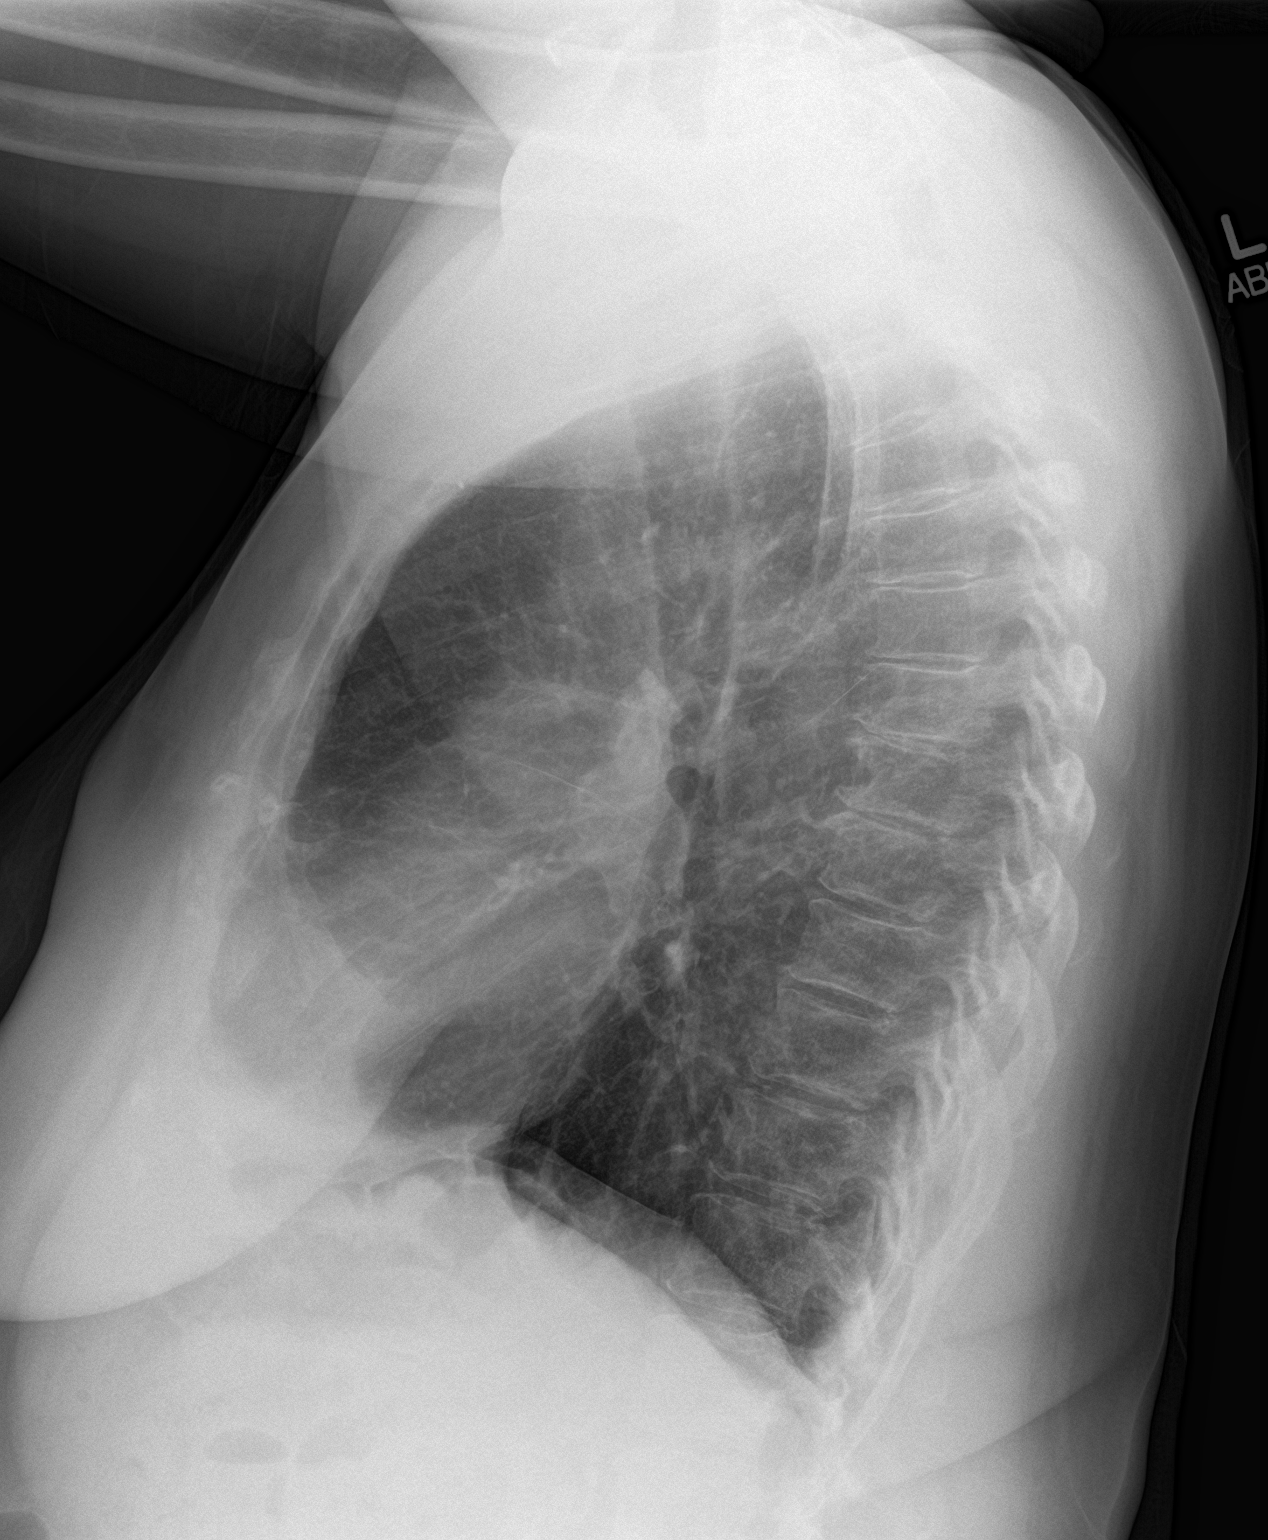

[2 of 2 positions shown; findings below may reference images not displayed]

FINDINGS: Mediastinum and hilar structures are normal. Heart size normal. No
acute infiltrate. No pleural effusion or pneumothorax. Tiny nodular
opacity noted over the left lung base, this may represent
overlapping shadows and or costochondral calcification. Repeat PA
and lateral chest x-ray suggested. If this nodular density persists
nonenhanced CT can be obtained. Prior right mastectomy. Degenerative
changes thoracic spine.
IMPRESSION: 1. Prior right mastectomy.
2. No acute cardiopulmonary disease. Faint nodular opacity noted
projected over the left lung base. This is most likely secondary to
overlapping shadows and or costochondral calcification . Repeat PA
and lateral chest x-ray suggested. If this nodular density persists
nonenhanced chest CT can be obtained to further evaluate.

## 2014-10-28 MED ORDER — PREDNISONE 20 MG PO TABS: 40.0000 mg | ORAL_TABLET | Freq: Every day | ORAL | Status: DC

## 2014-10-28 MED ORDER — IPRATROPIUM-ALBUTEROL 0.5-2.5 (3) MG/3ML IN SOLN
3.0000 mL | Freq: Once | RESPIRATORY_TRACT | Status: AC
  Administered 2014-10-28: 3 mL via RESPIRATORY_TRACT
  Filled 2014-10-28: qty 3

## 2014-10-28 MED ORDER — IOHEXOL 350 MG/ML SOLN
100.0000 mL | Freq: Once | INTRAVENOUS | Status: AC | PRN
  Administered 2014-10-28: 100 mL via INTRAVENOUS

## 2014-10-28 MED ORDER — ALBUTEROL SULFATE (2.5 MG/3ML) 0.083% IN NEBU
2.5000 mg | INHALATION_SOLUTION | Freq: Once | RESPIRATORY_TRACT | Status: AC
  Administered 2014-10-28: 2.5 mg via RESPIRATORY_TRACT
  Filled 2014-10-28: qty 3

## 2014-10-28 MED ORDER — DOXYCYCLINE HYCLATE 100 MG PO TABS: 100.0000 mg | ORAL_TABLET | Freq: Two times a day (BID) | ORAL | Status: DC

## 2014-10-28 MED ORDER — PREDNISONE 10 MG PO TABS
60.0000 mg | ORAL_TABLET | Freq: Once | ORAL | Status: AC
  Administered 2014-10-28: 60 mg via ORAL
  Filled 2014-10-28 (×2): qty 1

## 2014-10-28 NOTE — Discharge Instructions (Signed)
°Emergency Department Resource Guide °1) Find a Doctor and Pay Out of Pocket °Although you won't have to find out who is covered by your insurance plan, it is a good idea to ask around and get recommendations. You will then need to call the office and see if the doctor you have chosen will accept you as a new patient and what types of options they offer for patients who are self-pay. Some doctors offer discounts or will set up payment plans for their patients who do not have insurance, but you will need to ask so you aren't surprised when you get to your appointment. ° °2) Contact Your Local Health Department °Not all health departments have doctors that can see patients for sick visits, but many do, so it is worth a call to see if yours does. If you don't know where your local health department is, you can check in your phone book. The CDC also has a tool to help you locate your state's health department, and many state websites also have listings of all of their local health departments. ° °3) Find a Walk-in Clinic °If your illness is not likely to be very severe or complicated, you may want to try a walk in clinic. These are popping up all over the country in pharmacies, drugstores, and shopping centers. They're usually staffed by nurse practitioners or physician assistants that have been trained to treat common illnesses and complaints. They're usually fairly quick and inexpensive. However, if you have serious medical issues or chronic medical problems, these are probably not your best option. ° °No Primary Care Doctor: °- Call [facility] at  832-8000 - they can help you locate a primary care doctor that  accepts your insurance, provides certain services, etc. °- Physician Referral Service- 1-800-533-3463 ° °Chronic Pain Problems: °Organization         Address  Phone   Notes  °[facility]  ([phone]) [phone] Patients need to be referred by their primary care doctor.  ° °Medication  Assistance: °Organization         Address  Phone   Notes  °[place] [facility] [address] °[place], [place] [address] (336) 641-8030 --Must be a resident of [place] °-- Must have NO insurance coverage whatsoever (no Medicaid/ Medicare, etc.) °-- The pt. MUST have a primary care doctor that directs their care regularly and follows them in the community °  °[facility]  (866) 331-1348   °[employer]  (888) 892-1162   ° °Agencies that provide inexpensive medical care: °Organization         Address  Phone   Notes  °[facility]  (336) 832-8035   °[facility]    (336) 832-7272   °[facility] [address] °[place], [place] [address] (336) 832-4777   °[facility] [address], °[place] (336) 271-4999   °[facility]    (336) 373-0678   °[facility]    (336) 272-1050   °[facility] ° [address], [place] Phone:  (336) 832-4444, Fax:  (336) 832-4440 Hours of Operation:  9 am - 6 pm, M-F.  Also accepts Medicaid/Medicare and self-pay.  °[facility] ° [address], [place] Phone: (336) [phone], Fax: (336) [phone]. Hours of Operation:  8:30 am - 5:30 pm, M-F.  Also accepts Medicaid and self-pay.  °[facility] [place] [address], [place] Phone: (336) 878-6027   °[facility] [address], [place], [place] (336)723-1848, Ext. 123 Mondays & Thursdays: 7-9 AM.  First 15 patients are seen on a first come, first serve basis. °  ° °Medicaid-accepting [place] Providers: ° °Organization         Address  Phone   Notes  °[facility] [address], [place] (336) 641-2100 Also accepts self-pay patients.  °[facility] [address], [place] ° (336) 856-9996   °[facility] [address], [place]  (336) 288-8857   °[facility] [address] [place] [address], [place] (336) 299-7000   °Veita Bland [address], [place]  ° (336) 373-1557 Only accepts [place] Access Medicaid patients after they have their name applied to their card.  ° °Self-Pay (no insurance) in [place]: ° °Organization         Address  Phone   Notes  °Sickle Cell Patients, [facility] [address], [place] (336) 832-1970   °[facility] [address], [place] (336) 832-4400   °[facility] [place] ° [address], [place] (336) 992-4800   °[facility]/Dr. Osei-Bonsu ° [address] [place] [address], [place] or [address], [place] (336) [phone] Phone number for both [place] and [place] locations is the same.  °[facility] [address], [place] (336) 299-0000   °[facility] [address] [place] [address], [place] or [address] (336) 852-7530 °(336) 878-2260   °[facility] [address], [place] (336) 350-1642, phone; (336) 294-5005, fax Sees patients 1st and 3rd Saturday of every month.  Must not qualify for public or private insurance (i.e. Medicaid, Medicare, [place] Health Choice, Veterans' Benefits) • Household income should be no more than 200% of the poverty level •The clinic cannot treat you if you are pregnant or think you are pregnant • Sexually transmitted diseases are not treated at the clinic.  ° ° °Dental Care: °Organization         Address  Phone  Notes  °[place] [facility] [address], [place] (336) 641-6152 Accepts children up to age 21 who are enrolled in Medicaid or [place] Health Choice; pregnant women with a Medicaid card; and children who have applied for Medicaid or [place] Health Choice, but were declined, whose parents can pay a reduced fee at time of service.  °[place]  [facility] [place]  [address], [place] (336) 641-7733 Accepts children up to age 21 who are enrolled in Medicaid or [place] Health Choice; pregnant women with a Medicaid card; and children who have applied for Medicaid or [place] Health Choice, but were declined, whose parents can pay a reduced fee at time of service.  °[facility] ° [address],  (336) 641-4533 Patients are seen by appointment only. Walk-ins are not accepted. [facility] will see patients 18 years of age and older. °Monday - Tuesday (8am-5pm) °Most Wednesdays (8:30-5pm) °$30 per visit, cash only  °[facility] ° [address], [place] (336) [phone] Patients are seen by appointment only. Walk-ins are not accepted. [facility] will see patients 18 years of age and older. °One   Wednesday Evening (Monthly: Volunteer Based).  $30 per visit, cash only  °[facility]  (919) [phone] for adults; Children under age 4, call Graduate Pediatric Dentistry at ([phone]) [phone]. Children aged 4-14, please call ([phone]) [phone] to request a pediatric application. ° Dental services are provided in all areas of dental care including fillings, crowns and bridges, complete and partial dentures, implants, gum treatment, root canals, and extractions. Preventive care is also provided. Treatment is provided to both adults and children. °Patients are selected via a lottery and there is often a waiting list. °  °[facility] [address], °[place] ° (336) 763-8833 [email or web address] °  °[facility] [address], [place], [place] (336)723-1848, Ext. 123 Second and Fourth Thursday of each month, opens at 6:30 AM; Clinic ends at 9 AM.  Patients are seen on a first-come first-served basis, and a limited number are seen during each clinic.  ° °[facility] ° [address], [place], [place] (336) [phone]    Eligibility Requirements °You must have lived in [place], [place], or [place] counties for at least the last three months. °  You cannot be eligible for state or federal sponsored healthcare insurance, including Veterans Administration, Medicaid, or Medicare. °  You generally cannot be eligible for healthcare insurance through your employer.  °  How to apply: °Eligibility screenings are held every Tuesday and Wednesday afternoon from 1:00 pm until 4:00 pm. You do not need an appointment for the interview!  °[facility] [address], [place], [place] 336-631-2330   °[facility]  336-342-8273   °[facility]  336-703-3100   °[facility]  336-570-6415   ° °Behavioral Health Resources in the Community: °Intensive Outpatient Programs °Organization         Address  Phone  Notes  °[facility] [address], [place], [place] 336-878-6098   °[facility] [address], [place], [place] 336-832-9800   °[facility] [address], [place], [place] ° 336-882-2125   °[facility] [address],  °[place], [place] 1-800-853-5163 or 336-641-4981   °Substance Abuse Resources °Organization         Address  Phone  Notes  °[facility]  336-882-2125   °[facility]  336-784-9470   °[facility]  336-285-9073   °[facility]  336-845-3988   °Residential & Outpatient Substance Abuse Program  1-800-659-3381   °Psychological Services °Organization         Address  Phone  Notes  °[facility]  336- 832-9600   °[facility]  336- 378-7881   °[facility] [address], [place] 1-800-853-5163 or 336-641-4981   ° °Mobile Crisis Teams °Organization         Address  Phone  Notes  °[facility], Mobile Crisis Care Unit  1-877-626-1772   °Assertive °[facility] ° [address]  [place], [place] 336-834-9664   °Sharon DeEsch [address] °[place] 336-554-5454   ° °Self-Help/Support Groups °Organization         Address  Phone             Notes  °[facility] [place] - variety of support groups  336- 373-1402 Call for more information  °Narcotics Anonymous (NA), [facility] [address], °[place]  2 meetings at this location  ° °  Residential Treatment Programs Organization         Address  Phone  Notes  [facility] [address],    [place]  1-(302)103-0104   [facility]  [address] [place] [address], [place], [place]   [place], [place] 609-568-3853 Admissions: 8am-3pm M-F  [facility] [address],    [place], [place] 277-824-2353   [facility] [address] [place], [place], [place]   [facility] [address],  [place], [place]   [facility] Intensive Outpatient [address], [place], [place]   [facility] ([place]  [place], [place] 1-(919) 712-8548 or 603-213-3611   [facility] [address] [place], [place] Accepts Medicaid  [facility] [address]  [place] 1-(504)486-0361 Substance Abuse/Addiction Treatment   [facility]         Address  Phone  Notes  [facility]  828-137-9311   Domenic Schwab, PhD [address] [place], [place]   (239)266-9540 or (403) 658-0422   [facility] [place], [place] 347-416-9965   [facility] [address] [place], [place] (513)714-1008 Insurance/Medicaid/sponsorship through [facility] and Families [address] [place]                                    [place], [place] [phone] [place] [address], [place] [phone]    Dr. Adele Schilder  910-504-0581   [facility] Dept. 1) [address] [place] 2) [place] 3)  [address], [place] ([phone] [phone]  ([phone]   [place] (367)[phone] or [phone] (After Hours)      Take the prescriptions as directed.  Use your albuterol inhaler (2 to 4 puffs) every 4 hours for the next 7 days, then as needed for cough, wheezing, or shortness of breath.  Call your regular medical doctor tomorrow morning to schedule a follow up appointment within the next 2 to 3 days.  Return to the Emergency Department immediately sooner if worsening.

## 2014-10-28 NOTE — ED Provider Notes (Signed)
CSN: [id number]     Arrival date & time 10/28/14  1431 History   First MD Initiated Contact with Patient 10/28/14 [phone]     Chief Complaint  Patient presents with  . Shortness of Breath      HPI Pt was seen at 1605.  Per pt, c/o gradual onset and worsening of persistent cough, wheezing and SOB for the past 3 weeks.  Describes her symptoms as "my doctor says it might have to do with my smoking." Pt describes her cough as productive of "white and green" sputum. Pt has been evaluated by her PMD x2 over the past 2 weeks, including 3 days ago. Pt was rx levaquin, short course of prednisone, tessalon, and albuterol MDI. Pt states she feels "a little better" and "my mucus isn't just green anymore," but she "still hear myself wheezing." Pt continues to smoke cigarettes.  Denies palpitations, no back pain, no abd pain, no N/V/D, no fevers, no rash. Pt states she "had a gas pain" in her mid-chest last week, which improved after she "look a laxative." Denies any further episodes of chest discomfort.     Past Medical History  Diagnosis Date  . Diabetes mellitus   . Cancer   . High cholesterol   . Chronic kidney disease   . Shortness of breath   . GERD (gastroesophageal reflux disease)   . Blood transfusion   . Hypertension   . COPD (chronic obstructive pulmonary disease)   . Dysphagia    Past Surgical History  Procedure Laterality Date  . Mastectomy  right side  . Tonsillectomy    . Breast surgery    . Esophagogastroduodenoscopy (egd) with esophageal dilation  11/2012    [facility]    History  Substance Use Topics  . Smoking status: Current Every Day Smoker -- 1.00 packs/day    Types: Cigarettes  . Smokeless tobacco: Not on file  . Alcohol Use: No    Review of Systems ROS: Statement: All systems negative except as marked or noted in the HPI; Constitutional: Negative for fever and chills. ; ; Eyes: Negative for eye pain, redness and discharge. ; ; ENMT: Negative for ear pain, hoarseness, nasal  congestion, sinus pressure and sore throat. ; ; Cardiovascular: Negative for chest pain, palpitations, diaphoresis, and peripheral edema. ; ; Respiratory: +cough, wheezing, SOB. Negative for stridor. ; ; Gastrointestinal: Negative for nausea, vomiting, diarrhea, abdominal pain, blood in stool, hematemesis, jaundice and rectal bleeding. . ; ; Genitourinary: Negative for dysuria, flank pain and hematuria. ; ; Musculoskeletal: Negative for back pain and neck pain. Negative for swelling and trauma.; ; Skin: Negative for pruritus, rash, abrasions, blisters, bruising and skin lesion.; ; Neuro: Negative for headache, lightheadedness and neck stiffness. Negative for weakness, altered level of consciousness , altered mental status, extremity weakness, paresthesias, involuntary movement, seizure and syncope.      Allergies  Review of patient's allergies indicates no known allergies.  Home Medications   Prior to Admission medications   Medication Sig Start Date End Date Taking? Authorizing Provider  metFORMIN (GLUCOPHAGE) 500 MG tablet Take 1,000 mg by mouth 2 (two) times daily with a meal.      Historical Provider, MD   BP 168/76 mmHg  Pulse 92  Temp(Src) 98.4 F (36.9 C) (Oral)  Resp 20  Ht 5' (1.524 m)  Wt 151 lb (68.493 kg)  BMI 29.49 kg/m2  SpO2 98% Physical Exam 1610: Physical examination:  Nursing notes reviewed; Vital signs and O2 SAT reviewed;  Constitutional:  Well developed, Well nourished, Well hydrated, In no acute distress; Head:  Normocephalic, atraumatic; Eyes: EOMI, PERRL, No scleral icterus; ENMT: Mouth and pharynx normal, Mucous membranes moist; Neck: Supple, Full range of motion, No lymphadenopathy; Cardiovascular: Regular rate and rhythm, No murmur, rub, or gallop; Respiratory: Breath sounds diminished & equal bilaterally, faint wheezes. No audible wheezing. Speaking full sentences with ease, Normal respiratory effort/excursion; Chest: Nontender, Movement normal; Abdomen: Soft,  Nontender, Nondistended, Normal bowel sounds; Genitourinary: No CVA tenderness; Extremities: Pulses normal, No tenderness, No edema, No calf edema or asymmetry.; Neuro: AA&Ox3, Major CN grossly intact.  Speech clear. No gross focal motor or sensory deficits in extremities. Climbs on and off stretcher easily by herself. Gait steady.; Skin: Color normal, Warm, Dry.   ED Course  Procedures     EKG Interpretation   Date/Time:  Friday October 28 2014 14:43:29 EDT Ventricular Rate:  87 PR Interval:  118 QRS Duration: 68 QT Interval:  376 QTC Calculation: 452 R Axis:   59 Text Interpretation:  Normal sinus rhythm Normal ECG Confirmed by COOK   MD, BRIAN ([id number]) on 10/28/2014 3:59:50 PM      MDM  MDM Reviewed: previous chart, nursing note and vitals Reviewed previous: ECG and x-ray Interpretation: ECG and x-ray   Dg Chest 2 View 10/28/2014   CLINICAL DATA:  Shortness of breath.  EXAM: CHEST  2 VIEW  COMPARISON:  10/04/2014 .  03/02/2011.  FINDINGS: Mediastinum and hilar structures are normal. Heart size normal. No acute infiltrate. No pleural effusion or pneumothorax. Tiny nodular opacity noted over the left lung base, this may represent overlapping shadows and or costochondral calcification. Repeat PA and lateral chest x-ray suggested. If this nodular density persists nonenhanced CT can be obtained. Prior right mastectomy. Degenerative changes thoracic spine.  IMPRESSION: 1. Prior right mastectomy. 2. No acute cardiopulmonary disease. Faint nodular opacity noted projected over the left lung base. This is most likely secondary to overlapping shadows and or costochondral calcification . Repeat PA and lateral chest x-ray suggested. If this nodular density persists nonenhanced chest CT can be obtained to further evaluate.   Electronically Signed   By: Marcello Moores  Register   On: 10/28/2014 15:02     Results for orders placed or performed during the hospital encounter of 10/28/14  I-stat chem 8, ed   Result Value Ref Range   Sodium 143 135 - 145 mmol/L   Potassium 3.6 3.5 - 5.1 mmol/L   Chloride 105 96 - 112 mmol/L   BUN 7 6 - 23 mg/dL   Creatinine, Ser 0.90 0.50 - 1.10 mg/dL   Glucose, Bld 118 (H) 70 - 99 mg/dL   Calcium, Ion 1.16 1.13 - 1.30 mmol/L   TCO2 23 0 - 100 mmol/L   Hemoglobin 14.6 12.0 - 15.0 g/dL   HCT 43.0 36.0 - 46.0 %  I-stat troponin, ED  Result Value Ref Range   Troponin i, poc 0.00 0.00 - 0.08 ng/mL   Comment 3           Dg Chest 2 View 10/28/2014   CLINICAL DATA:  Shortness of breath.  EXAM: CHEST  2 VIEW  COMPARISON:  10/04/2014 .  03/02/2011.  FINDINGS: Mediastinum and hilar structures are normal. Heart size normal. No acute infiltrate. No pleural effusion or pneumothorax. Tiny nodular opacity noted over the left lung base, this may represent overlapping shadows and or costochondral calcification. Repeat PA and lateral chest x-ray suggested. If this nodular density persists nonenhanced CT can be obtained. Prior  right mastectomy. Degenerative changes thoracic spine.  IMPRESSION: 1. Prior right mastectomy. 2. No acute cardiopulmonary disease. Faint nodular opacity noted projected over the left lung base. This is most likely secondary to overlapping shadows and or costochondral calcification . Repeat PA and lateral chest x-ray suggested. If this nodular density persists nonenhanced chest CT can be obtained to further evaluate.   Electronically Signed   By: Marcello Moores  Register   On: 10/28/2014 15:02   Ct Angio Chest Pe W/cm &/or Wo Cm 10/28/2014   CLINICAL DATA:  Shortness of breath for 3 weeks with chest pain on the left for several days  EXAM: CT ANGIOGRAPHY CHEST WITH CONTRAST  TECHNIQUE: Multidetector CT imaging of the chest was performed using the standard protocol during bolus administration of intravenous contrast. Multiplanar CT image reconstructions and MIPs were obtained to evaluate the vascular anatomy.  CONTRAST:  169mL OMNIPAQUE IOHEXOL 350 MG/ML SOLN  COMPARISON:   None.  FINDINGS: Lungs are well aerated bilaterally without focal infiltrate or sizable effusion. No parenchymal nodules are seen.  The thoracic inlet is within normal limits. The thoracic aorta demonstrates some mild calcific change although no aneurysmal dilatation or dissection is seen. The left anterior descending coronary artery demonstrates calcification. The pulmonary artery is well visualized and demonstrates a normal branching pattern. No filling defect to suggest pulmonary embolism is identified. No significant hilar or mediastinal adenopathy is seen.  Scanning into the upper abdomen reveals no acute abnormality. Visualized bony structures show degenerative changes of the thoracic spine.  Review of the MIP images confirms the above findings.  IMPRESSION: No evidence of pulmonary embolism.  No acute abnormality is identified.   Electronically Signed   By: Inez Catalina M.D.   On: 10/28/2014 18:56    1900:  Pt states she "feels better" after neb and steroid.  NAD, lungs CTA bilat, no wheezing, resps easy, speaking full sentences, Sats 99% R/A.  Pt ambulated around the ED while holding a conversation with easy resps, NAD. Denies any further "CP" episode since last week. Pt states she wants to go home now. Doubt PE as cause for symptoms with normal CT-A chest.  Doubt ACS as cause for symptoms with normal troponin and EKG after 1 brief episode of "CP" 1 week ago. Will continue to tx symptomatically at this time. Dx and testing d/w pt.  Questions answered.  Verb understanding, agreeable to d/c home with outpt f/u.     Francine Graven, DO 10/31/14 2023

## 2014-10-28 NOTE — ED Notes (Signed)
Reports sob over last 3 weeks. Pt was dx with pneumonia and given antibiotic. Reports wheezes and sob has persisted along with left sided chest pain intermittently.

## 2015-08-10 ENCOUNTER — Encounter (INDEPENDENT_AMBULATORY_CARE_PROVIDER_SITE_OTHER): Payer: Self-pay | Admitting: *Deleted

## 2015-08-17 ENCOUNTER — Encounter (INDEPENDENT_AMBULATORY_CARE_PROVIDER_SITE_OTHER): Payer: Self-pay | Admitting: *Deleted

## 2015-08-17 ENCOUNTER — Other Ambulatory Visit (INDEPENDENT_AMBULATORY_CARE_PROVIDER_SITE_OTHER): Payer: Self-pay | Admitting: *Deleted

## 2015-08-17 DIAGNOSIS — Z86010 Personal history of colonic polyps: Secondary | ICD-10-CM

## 2015-09-07 ENCOUNTER — Other Ambulatory Visit (INDEPENDENT_AMBULATORY_CARE_PROVIDER_SITE_OTHER): Payer: Self-pay | Admitting: *Deleted

## 2015-09-07 ENCOUNTER — Encounter (INDEPENDENT_AMBULATORY_CARE_PROVIDER_SITE_OTHER): Payer: Self-pay | Admitting: *Deleted

## 2015-09-07 MED ORDER — PEG 3350-KCL-NA BICARB-NACL 420 G PO SOLR: 4000.0000 mL | Freq: Once | ORAL | Status: DC

## 2015-09-07 NOTE — Telephone Encounter (Signed)
Patient needs trilyte 

## 2015-09-26 ENCOUNTER — Telehealth (INDEPENDENT_AMBULATORY_CARE_PROVIDER_SITE_OTHER): Payer: Self-pay | Admitting: *Deleted

## 2015-09-26 NOTE — Telephone Encounter (Signed)
Referring MD/PCP: [facility]   Procedure: tcs  Reason/Indication:  Hx polyps  Has patient had this procedure before?  Yes, 5-6 yrs ago  If so, when, by whom and where?    Is there a family history of colon cancer?  no  Who?  What age when diagnosed?    Is patient diabetic?   yes      Does patient have prosthetic heart valve or mechanical valve?  no  Do you have a pacemaker?  no  Has patient ever had endocarditis? no  Has patient had joint replacement within last 12 months?  no  Does patient tend to be constipated or take laxatives? sometimes  Does patient have a history of alcohol/drug use?  no  Is patient on Coumadin, Plavix and/or Aspirin? yes  Medications: asa 81 mg daily, naprosyn 500 mg daily, metformin 500 mg daily, omeprazole 20 mg daily, lovastatin 20 mg daily, benazepril 20 mg daily, vit c 500 mg daily, nasal spray, ferrous sulfate  Allergies: nkda  Medication Adjustment: asa 2 days, iron 10 days, hold metformin morning of procedure  Procedure date & time: 10/18/15 at 830

## 2015-09-26 NOTE — Telephone Encounter (Signed)
agree

## 2015-10-18 ENCOUNTER — Encounter (INDEPENDENT_AMBULATORY_CARE_PROVIDER_SITE_OTHER): Payer: Self-pay | Admitting: *Deleted

## 2015-12-05 ENCOUNTER — Telehealth (INDEPENDENT_AMBULATORY_CARE_PROVIDER_SITE_OTHER): Payer: Self-pay | Admitting: *Deleted

## 2015-12-05 NOTE — Telephone Encounter (Signed)
Referring MD/PCP: [facility]   Procedure: tcs  Reason/Indication:  Hx polyps  Has patient had this procedure before?  Yes, 5-6 yrs ago  If so, when, by whom and where?    Is there a family history of colon cancer?  no  Who?  What age when diagnosed?    Is patient diabetic?   yes      Does patient have prosthetic heart valve or mechanical valve?  no  Do you have a pacemaker?  no  Has patient ever had endocarditis? no  Has patient had joint replacement within last 12 months?  no  Does patient tend to be constipated or take laxatives? no  Does patient have a history of alcohol/drug use?  no  Is patient on Coumadin, Plavix and/or Aspirin? yes  Medications: asa 81 mg daily, naprosyn 500 mg daily, metformin 500 mg daily, omeprazole 20 mg daily, lovastatin 20 mg daily, benazepril 20 mg daily, vit c 500 mg daily, nasal spray, ferrous sulfate  Allergies: nkda  Medication Adjustment: asa 2 days, iron 10 days, hold metformin morning of procedure  Procedure date & time: 01/04/16 at 930

## 2015-12-07 NOTE — Telephone Encounter (Signed)
agree

## 2016-01-04 ENCOUNTER — Ambulatory Visit (HOSPITAL_COMMUNITY): Admission: RE | Admit: 2016-01-04 | Payer: Medicare Other | Source: Ambulatory Visit | Admitting: Internal Medicine

## 2016-01-04 ENCOUNTER — Encounter (HOSPITAL_COMMUNITY): Admission: RE | Payer: Self-pay | Source: Ambulatory Visit

## 2016-01-04 SURGERY — COLONOSCOPY
Anesthesia: Moderate Sedation

## 2016-03-15 ENCOUNTER — Encounter (INDEPENDENT_AMBULATORY_CARE_PROVIDER_SITE_OTHER): Payer: Self-pay | Admitting: *Deleted

## 2016-03-20 ENCOUNTER — Other Ambulatory Visit (INDEPENDENT_AMBULATORY_CARE_PROVIDER_SITE_OTHER): Payer: Self-pay | Admitting: *Deleted

## 2016-03-20 DIAGNOSIS — Z86010 Personal history of colonic polyps: Secondary | ICD-10-CM

## 2016-04-29 ENCOUNTER — Telehealth (INDEPENDENT_AMBULATORY_CARE_PROVIDER_SITE_OTHER): Payer: Self-pay | Admitting: *Deleted

## 2016-04-29 ENCOUNTER — Encounter (INDEPENDENT_AMBULATORY_CARE_PROVIDER_SITE_OTHER): Payer: Self-pay | Admitting: *Deleted

## 2016-04-29 NOTE — Telephone Encounter (Signed)
Patient needs trilyte 

## 2016-04-29 NOTE — Telephone Encounter (Signed)
Referring MD/PCP: [facility]    Procedure: tcs  Reason/Indication:  Hx polyps  Has patient had this procedure before?  Yes, 5-6 yrs ago  If so, when, by whom and where?    Is there a family history of colon cancer?  no  Who?  What age when diagnosed?    Is patient diabetic?   yes      Does patient have prosthetic heart valve or mechanical valve?  no  Do you have a pacemaker?  no  Has patient ever had endocarditis? no  Has patient had joint replacement within last 12 months?  no  Does patient tend to be constipated or take laxatives? no  Does patient have a history of alcohol/drug use?  no  Is patient on Coumadin, Plavix and/or Aspirin? yes  Medications: asa 81 mg daily, naprosyn 500 mg daily, metformin 500 mg daily, omeprazole 20 mg daily, lovastatin 20 mg daily, benazepril 20 mg daily, vit c, nasal spray, ferrou sulfate  Allergies: nkda  Medication Adjustment: asa 2 days, iron 10 days, hold metformin morning of procedure  Procedure date & time: 05/30/16 at 1230

## 2016-04-30 MED ORDER — PEG 3350-KCL-NA BICARB-NACL 420 G PO SOLR: 4000.0000 mL | Freq: Once | ORAL | 0 refills | Status: AC

## 2016-04-30 NOTE — Telephone Encounter (Signed)
agree

## 2016-07-02 ENCOUNTER — Other Ambulatory Visit (HOSPITAL_COMMUNITY): Payer: Self-pay | Admitting: Physician Assistant

## 2016-07-02 ENCOUNTER — Other Ambulatory Visit (HOSPITAL_COMMUNITY): Payer: Self-pay | Admitting: Pulmonary Disease

## 2016-07-02 DIAGNOSIS — Z09 Encounter for follow-up examination after completed treatment for conditions other than malignant neoplasm: Secondary | ICD-10-CM

## 2016-07-02 DIAGNOSIS — N6489 Other specified disorders of breast: Secondary | ICD-10-CM

## 2016-07-02 DIAGNOSIS — R928 Other abnormal and inconclusive findings on diagnostic imaging of breast: Secondary | ICD-10-CM

## 2016-07-04 ENCOUNTER — Other Ambulatory Visit (HOSPITAL_COMMUNITY): Payer: Self-pay | Admitting: Physician Assistant

## 2016-07-04 DIAGNOSIS — N6489 Other specified disorders of breast: Secondary | ICD-10-CM

## 2016-07-30 ENCOUNTER — Other Ambulatory Visit (HOSPITAL_COMMUNITY): Payer: Self-pay | Admitting: Physician Assistant

## 2016-07-30 ENCOUNTER — Ambulatory Visit (HOSPITAL_COMMUNITY)
Admission: RE | Admit: 2016-07-30 | Discharge: 2016-07-30 | Disposition: A | Discharge status: Home / Self Care | Payer: Medicare Other | Source: Ambulatory Visit | Attending: Physician Assistant | Admitting: Physician Assistant

## 2016-07-30 DIAGNOSIS — Z9011 Acquired absence of right breast and nipple: Secondary | ICD-10-CM | POA: Insufficient documentation

## 2016-07-30 DIAGNOSIS — N6320 Unspecified lump in the left breast, unspecified quadrant: Secondary | ICD-10-CM | POA: Diagnosis not present

## 2016-07-30 DIAGNOSIS — N6489 Other specified disorders of breast: Secondary | ICD-10-CM

## 2016-07-31 ENCOUNTER — Telehealth (INDEPENDENT_AMBULATORY_CARE_PROVIDER_SITE_OTHER): Payer: Self-pay | Admitting: *Deleted

## 2016-07-31 ENCOUNTER — Encounter (INDEPENDENT_AMBULATORY_CARE_PROVIDER_SITE_OTHER): Payer: Self-pay | Admitting: *Deleted

## 2016-07-31 NOTE — Telephone Encounter (Signed)
Patient needs trilyte 

## 2016-08-01 MED ORDER — PEG 3350-KCL-NA BICARB-NACL 420 G PO SOLR: 4000.0000 mL | Freq: Once | ORAL | 0 refills | Status: AC

## 2016-08-06 ENCOUNTER — Telehealth (INDEPENDENT_AMBULATORY_CARE_PROVIDER_SITE_OTHER): Payer: Self-pay | Admitting: *Deleted

## 2016-08-06 NOTE — Telephone Encounter (Signed)
Referring MD/PCP: [facility]   Procedure: tcs  Reason/Indication:  Hx polyps  Has patient had this procedure before?  Yes 5-6 yrs ago  If so, when, by whom and where?    Is there a family history of colon cancer?  no  Who?  What age when diagnosed?    Is patient diabetic?   yes      Does patient have prosthetic heart valve or mechanical valve?  no  Do you have a pacemaker?  no  Has patient ever had endocarditis? no  Has patient had joint replacement within last 12 months?  no  Does patient tend to be constipated or take laxatives? no  Does patient have a history of alcohol/drug use?  no  Is patient on Coumadin, Plavix and/or Aspirin? yes  Medications: asa 81 mg daily, naprosyn 500 mg daily, metformin 500 mg daily, omeprazole 20 mg daily, lovastatin 20 mg daily, benazepril 20 mg daily, vit c daily, nasal spray, ferrous sulfate  Allergies: nkda  Medication Adjustment: asa 2 days, iron 10 days, hold metformin morning of procedure  Procedure date & time: 09/04/16 at 930

## 2016-08-08 NOTE — Telephone Encounter (Signed)
it's noted in triage sheet and patient is aware to stop the iron 10 days prior to procedure

## 2016-08-08 NOTE — Telephone Encounter (Signed)
agree

## 2016-08-08 NOTE — Telephone Encounter (Signed)
Michelle Henry, patient is on iron.

## 2016-08-13 ENCOUNTER — Ambulatory Visit (HOSPITAL_COMMUNITY)
Admission: RE | Admit: 2016-08-13 | Discharge: 2016-08-13 | Disposition: A | Discharge status: Home / Self Care | Payer: Medicare Other | Source: Ambulatory Visit | Attending: Physician Assistant | Admitting: Physician Assistant

## 2016-08-13 ENCOUNTER — Other Ambulatory Visit (HOSPITAL_COMMUNITY): Payer: Self-pay | Admitting: Physician Assistant

## 2016-08-13 ENCOUNTER — Other Ambulatory Visit (HOSPITAL_COMMUNITY): Payer: Self-pay | Admitting: Radiology

## 2016-08-13 DIAGNOSIS — N6320 Unspecified lump in the left breast, unspecified quadrant: Secondary | ICD-10-CM

## 2016-08-13 DIAGNOSIS — Z853 Personal history of malignant neoplasm of breast: Secondary | ICD-10-CM | POA: Diagnosis not present

## 2016-08-13 DIAGNOSIS — N6489 Other specified disorders of breast: Secondary | ICD-10-CM

## 2016-08-13 DIAGNOSIS — N6323 Unspecified lump in the left breast, lower outer quadrant: Secondary | ICD-10-CM | POA: Diagnosis present

## 2016-08-13 DIAGNOSIS — C50512 Malignant neoplasm of lower-outer quadrant of left female breast: Secondary | ICD-10-CM | POA: Insufficient documentation

## 2016-08-13 MED ORDER — LIDOCAINE-EPINEPHRINE (PF) 1 %-1:200000 IJ SOLN
INTRAMUSCULAR | Status: AC
  Filled 2016-08-13: qty 30

## 2016-08-28 ENCOUNTER — Other Ambulatory Visit (HOSPITAL_COMMUNITY): Payer: Self-pay | Admitting: Physician Assistant

## 2016-08-28 DIAGNOSIS — R928 Other abnormal and inconclusive findings on diagnostic imaging of breast: Secondary | ICD-10-CM

## 2016-08-28 DIAGNOSIS — N6489 Other specified disorders of breast: Secondary | ICD-10-CM

## 2016-09-03 ENCOUNTER — Ambulatory Visit (HOSPITAL_COMMUNITY)
Admission: RE | Admit: 2016-09-03 | Discharge: 2016-09-03 | Disposition: A | Discharge status: Home / Self Care | Payer: Medicare Other | Source: Ambulatory Visit | Attending: Physician Assistant | Admitting: Physician Assistant

## 2016-09-03 DIAGNOSIS — R928 Other abnormal and inconclusive findings on diagnostic imaging of breast: Secondary | ICD-10-CM | POA: Insufficient documentation

## 2016-09-03 DIAGNOSIS — N6323 Unspecified lump in the left breast, lower outer quadrant: Secondary | ICD-10-CM | POA: Diagnosis not present

## 2016-09-03 DIAGNOSIS — N6489 Other specified disorders of breast: Secondary | ICD-10-CM | POA: Diagnosis present

## 2016-09-03 LAB — POCT I-STAT CREATININE: Creatinine, Ser: 0.7 mg/dL (ref 0.44–1.00)

## 2016-09-03 MED ORDER — GADOBENATE DIMEGLUMINE 529 MG/ML IV SOLN
15.0000 mL | Freq: Once | INTRAVENOUS | Status: AC | PRN
  Administered 2016-09-03: 14 mL via INTRAVENOUS

## 2016-09-12 MED ORDER — GADOBENATE DIMEGLUMINE 529 MG/ML IV SOLN: 14.0000 mL | Freq: Once | INTRAVENOUS | Status: DC | PRN

## 2016-10-10 ENCOUNTER — Other Ambulatory Visit: Payer: Self-pay | Admitting: Physician Assistant

## 2016-10-10 DIAGNOSIS — R928 Other abnormal and inconclusive findings on diagnostic imaging of breast: Secondary | ICD-10-CM

## 2016-10-24 ENCOUNTER — Ambulatory Visit
Admission: RE | Admit: 2016-10-24 | Discharge: 2016-10-24 | Disposition: A | Discharge status: Home / Self Care | Payer: Medicare Other | Source: Ambulatory Visit | Attending: Physician Assistant | Admitting: Physician Assistant

## 2016-10-24 DIAGNOSIS — R928 Other abnormal and inconclusive findings on diagnostic imaging of breast: Secondary | ICD-10-CM

## 2016-10-24 IMAGING — MG MM CLIP PLACEMENT
2 series · 2 of 2 positions shown · non-contrast
Comparison: Previous exam(s).

CLINICAL DATA: Evaluate biopsy marker placement

EXAM:
DIAGNOSTIC LEFT MAMMOGRAM POST MRI BIOPSY

[L ML]
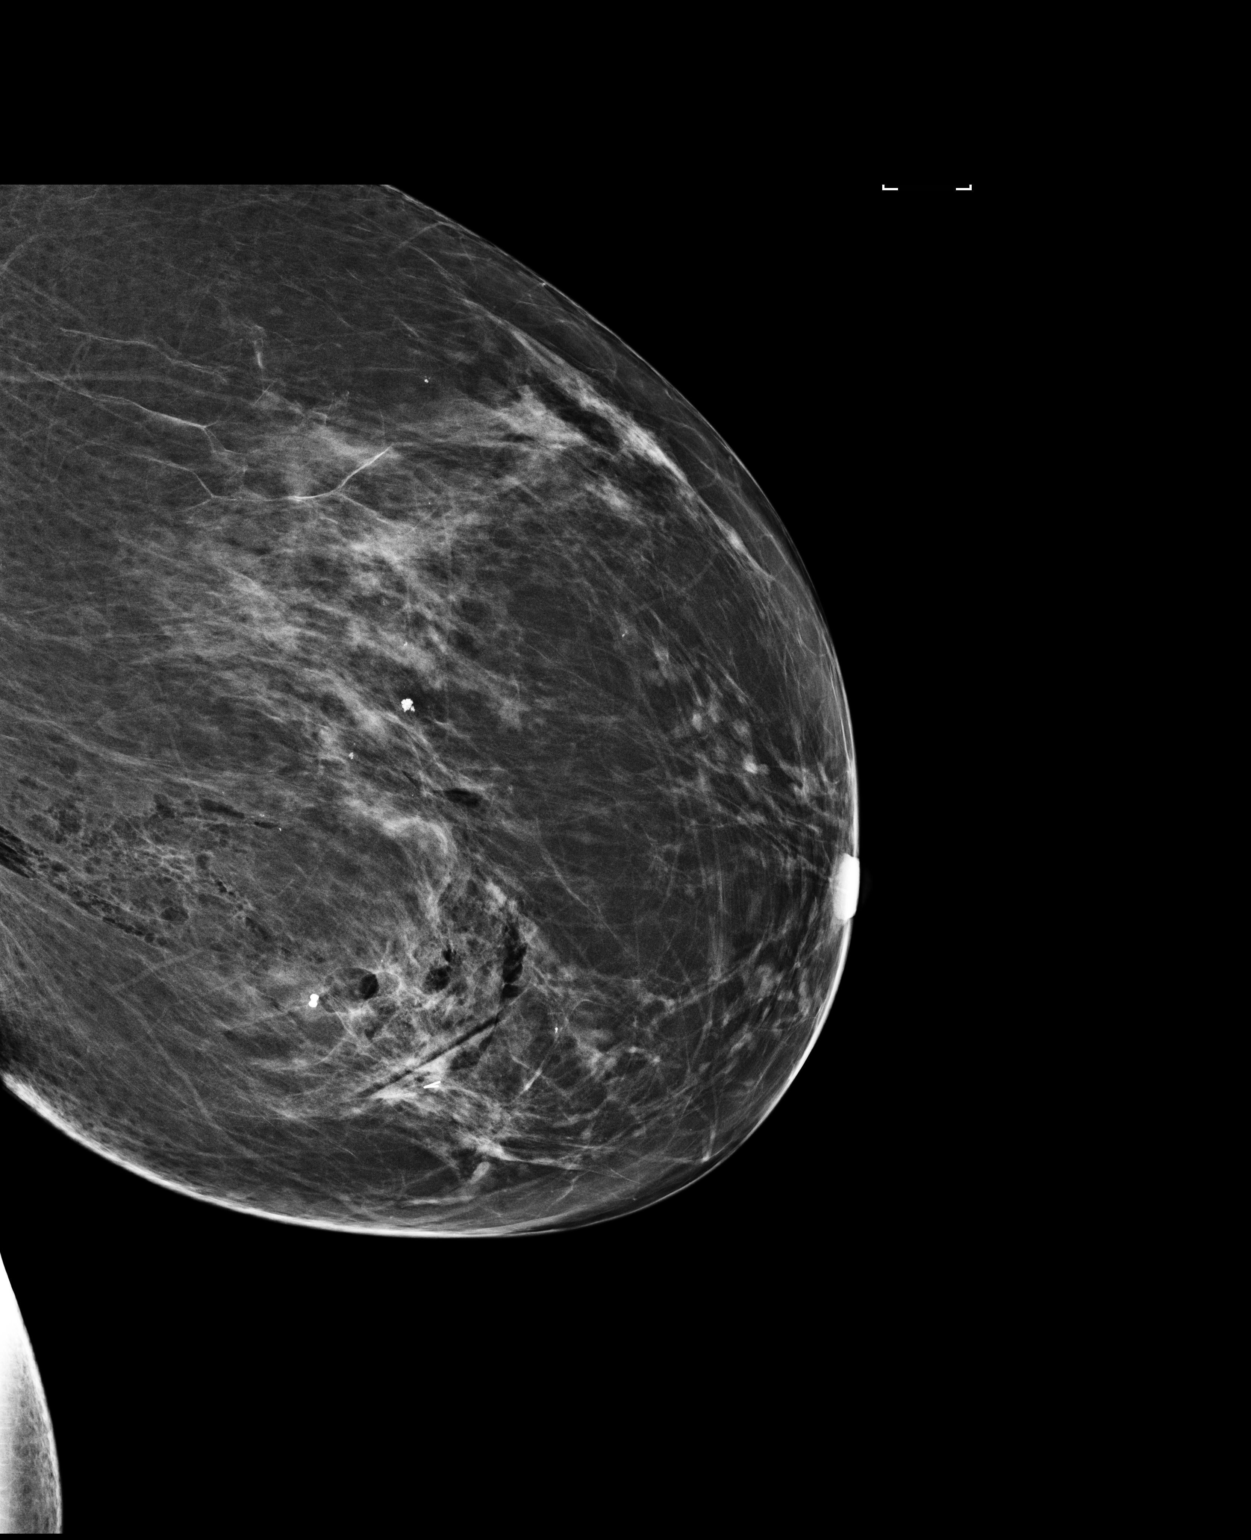

[L CC]
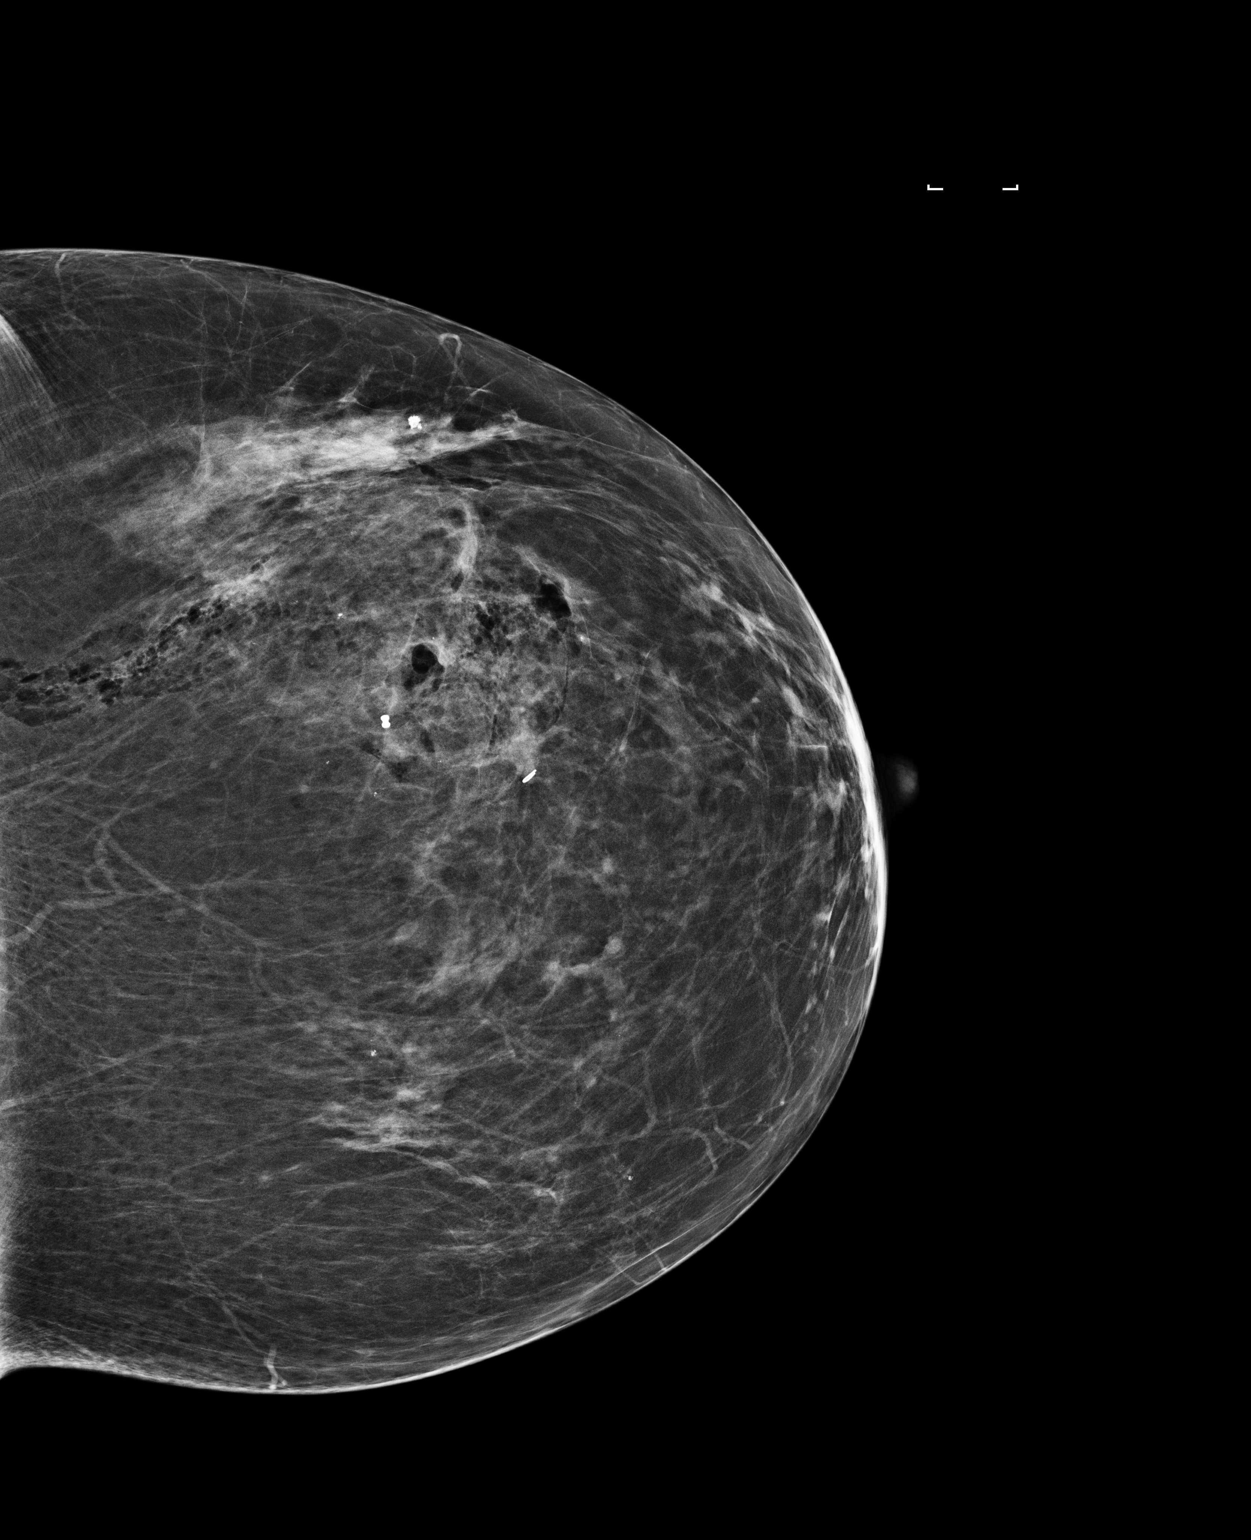

[2 of 2 positions shown; findings below may reference images not displayed]

FINDINGS: Mammographic images were obtained following MRI guided biopsy of non
mass enhancement. The dumbbell shaped biopsy clip is 2.8 cm
posterior to the previously biopsied known malignancy.
IMPRESSION: Appropriate clip placement.

Final Assessment: Post Procedure Mammograms for Marker Placement

## 2016-10-30 ENCOUNTER — Encounter (INDEPENDENT_AMBULATORY_CARE_PROVIDER_SITE_OTHER): Payer: Self-pay | Admitting: *Deleted

## 2016-10-30 ENCOUNTER — Telehealth (INDEPENDENT_AMBULATORY_CARE_PROVIDER_SITE_OTHER): Payer: Self-pay | Admitting: *Deleted

## 2016-10-30 NOTE — Telephone Encounter (Signed)
Referring MD/PCP: [facility]   Procedure: tcs  Reason/Indication:  Hx polyps  Has patient had this procedure before?  Yes 5-6 yrs ago             If so, when, by whom and where?    Is there a family history of colon cancer?  no             Who?  What age when diagnosed?    Is patient diabetic?   yes                                                  Does patient have prosthetic heart valve or mechanical valve?  no  Do you have a pacemaker?  no  Has patient ever had endocarditis? no  Has patient had joint replacement within last 12 months?  no  Does patient tend to be constipated or take laxatives? no  Does patient have a history of alcohol/drug use?  no  Is patient on Coumadin, Plavix and/or Aspirin? yes  Medications: asa 81 mg daily, naprosyn 500 mg daily, metformin 500 mg daily, omeprazole 20 mg daily, lovastatin 20 mg daily, benazepril 20 mg daily, vit c daily, nasal spray, ferrous sulfate  Allergies: nkda  Medication Adjustment: asa 2 days, iron 10 days, hold metformin morning of procedure  Procedure date & time: 11/27/16 at 930

## 2016-10-30 NOTE — Telephone Encounter (Signed)
agree

## 2016-10-31 ENCOUNTER — Encounter (HOSPITAL_COMMUNITY): Payer: Self-pay

## 2016-10-31 ENCOUNTER — Encounter (HOSPITAL_COMMUNITY): Payer: Medicare Other | Attending: Oncology | Admitting: Oncology

## 2016-10-31 DIAGNOSIS — C50912 Malignant neoplasm of unspecified site of left female breast: Secondary | ICD-10-CM

## 2016-10-31 DIAGNOSIS — Z170 Estrogen receptor positive status [ER+]: Secondary | ICD-10-CM | POA: Diagnosis not present

## 2016-10-31 NOTE — Patient Instructions (Addendum)
[place] at [facility] [place] Discharge Instructions  RECOMMENDATIONS MADE BY THE CONSULTANT AND ANY TEST RESULTS WILL BE SENT TO YOUR REFERRING PHYSICIAN.  You were seen today by Dr. Twana First We will refer you to Dr. Arnoldo Morale Follow up in 4 weeks See Amy up front for appointments   Thank you for choosing [place] at [facility] to provide your oncology and hematology care.  To afford each patient quality time with our provider, please arrive at least 15 minutes before your scheduled appointment time.    If you have a lab appointment with the [facility] please come in thru the  Main Entrance and check in at the main information desk  You need to re-schedule your appointment should you arrive 10 or more minutes late.  We strive to give you quality time with our providers, and arriving late affects you and other patients whose appointments are after yours.  Also, if you no show three or more times for appointments you may be dismissed from the clinic at the providers discretion.     Again, thank you for choosing [facility].  Our hope is that these requests will decrease the amount of time that you wait before being seen by our physicians.       _____________________________________________________________  Should you have questions after your visit to [facility], please contact our office at [phone] between the hours of 8:30 a.m. and 4:30 p.m.  Voicemails left after 4:30 p.m. will not be returned until the following business day.  For prescription refill requests, have your pharmacy contact our office.       Resources For Cancer Patients and their Caregivers ? American Cancer Society: Can assist with transportation, wigs, general needs, runs Look Good Feel Better.        970-137-3911 ? Cancer Care: Provides financial assistance, online support groups, medication/co-pay assistance.  1-800-813-HOPE  (548)377-9553) ? [facility] Assists [place] cancer patients and their families through emotional , educational and financial support.  865 457 6859 ? [place] DSS Where to apply for food stamps, Medicaid and utility assistance. 260-281-0176 ? [facility]: Transportation to medical appointments. (585)332-5032 ? Social Security Administration: May apply for disability if have a Stage IV cancer. 701-424-4526 620-361-4623 ? [employer], Disability and Transit Services: Assists with nutrition, care and transit needs. [place] Support Programs: @10RELATIVEDAYS @ > Cancer Support Group  2nd Tuesday of the month 1pm-2pm, Journey Room  > Creative Journey  3rd Tuesday of the month 1130am-1pm, Journey Room  > Look Good Feel Better  1st Wednesday of the month 10am-12 noon, Journey Room (Call [place] to register [phone])

## 2016-10-31 NOTE — Progress Notes (Signed)
Michelle Henry NOTE  Patient Care Team: [facility] as PCP - General  CHIEF COMPLAINTS/PURPOSE OF CONSULTATION:  Left breast IDC  HISTORY OF PRESENTING ILLNESS:  Michelle Henry 71 y.o.postmenopausal female is here because of referral by [facility] for left breast IDC. 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 She is ER 100% positive, PR 80% positive.   Patient has a past medical history of early stage right breast cancer s/p mastectomy at [place], chronic kidney disease, COPD, Diabetes mellitus, dysphagia, GERD, hypertension, and hypercholesterolemia.  In 2006 she had a mastectomy for the cancer on her right breast. She did not receive any radiation or chemotherapy at that time. She wants to have mastectomy done for her current left breast carcinoma.    Breast cancer, left ([place])   07/30/2016 Mammogram    Left diagnostic mammogram: Mammographic distortion in the lateral inferior left breast thought to correlate with a region of distortion and a small associated mass seen on ultrasound.      07/30/2016 Imaging    Left breast US: Targeted ultrasound is performed, showing a mass and distortion in the left breast at 4 o'clock, 4 cm from the nipple, thought to correlate with the mammographic finding measuring 7 x 4 x 6 mm. The mass is irregular and taller than wide. No left axillary adenopathy.      08/13/2016 Pathology Results    Left breast core needle biopsy: Breast, left, needle core biopsy, 4:00 - INVASIVE DUCTAL CARCINOMA. - DUCTAL CARCINOMA IN SITU. - SEE COMMENT. Microscopic Comment The invasive carcinoma is grade 1 and is highlighted by a lack of myoepithelial cells as demonstrated by p63, calponin, and smooth muscle myosin stains.  Hormone Profile: ER positive, PR positive, HER2 negative      08/13/2016 Initial Diagnosis    Breast cancer, left  ([place])      09/03/2016 Imaging    MRI breast: IMPRESSION: 1. Architectural distortion involving the lower outer quadrant of the left breast. The tissue marker clip placed at the time of ultrasound biopsy is within the area of distortion. 2. 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 There is no mammographic correlate. 3. No pathologic lymphadenopathy.      Today she states overall she feels well. Denies chest pain, sob, abdominal pain. She notes that she has lost some weight unintentionally. She used to eat only one meal per day previously. She is currently trying to eat better and eat 3 meals per day.       MEDICAL HISTORY:  Past Medical History:  Diagnosis Date  . Blood transfusion   . Cancer ([place])   . Chronic kidney disease   . COPD (chronic obstructive pulmonary disease) ([place])   . Diabetes mellitus   . Dysphagia   . GERD (gastroesophageal reflux disease)   . High cholesterol   . Hypertension   . Shortness of breath     SURGICAL HISTORY: Past Surgical History:  Procedure Laterality Date  . BREAST SURGERY    . ESOPHAGOGASTRODUODENOSCOPY (EGD) WITH ESOPHAGEAL DILATION  11/2012   [facility]  . MASTECTOMY  right side  . TONSILLECTOMY      SOCIAL HISTORY: Social History   Social History  . Marital status: Single    Spouse name: N/A  . Number of children:  N/A  . Years of education: N/A   Occupational History  . Not on file.   Social History Main Topics  . Smoking status: Current Every Day Smoker    Packs/day: 0.75    Years: 53.00    Types: Cigarettes  . Smokeless tobacco: Never Used  . Alcohol use No  . Drug use: No  . Sexual activity: Yes   Other Topics Concern  . Not on file   Social History Narrative  . No narrative on file    FAMILY HISTORY: No family history on file. No family history of breast or ovarian cancer.   ALLERGIES:  has No  Known Allergies.  MEDICATIONS:  Current Outpatient Prescriptions  Medication Sig Dispense Refill  . albuterol (PROVENTIL HFA;VENTOLIN HFA) 108 (90 BASE) MCG/ACT inhaler Inhale 1-2 puffs into the lungs every 6 (six) hours as needed for wheezing or shortness of breath.    Marland Kitchen aspirin EC 81 MG tablet Take 81 mg by mouth daily.    . beclomethasone (QVAR) 40 MCG/ACT inhaler Inhale 1 puff into the lungs every morning. *May take additional puff as needed    . benazepril (LOTENSIN) 20 MG tablet Take 20 mg by mouth daily.    . Iron-Vitamin C 65-125 MG TABS Take 1 tablet by mouth daily.    Marland Kitchen lovastatin (MEVACOR) 20 MG tablet Take 20 mg by mouth at bedtime.    . metFORMIN (GLUCOPHAGE) 500 MG tablet     . naproxen (NAPROSYN) 500 MG tablet Take 500 mg by mouth 2 (two) times daily with a meal.    . omeprazole (PRILOSEC) 20 MG capsule Take 20 mg by mouth daily.     No current facility-administered medications for this visit.    Facility-Administered Medications Ordered in Other Visits  Medication Dose Route Frequency Provider Last Rate Last Dose  . gadobenate dimeglumine (MULTIHANCE) injection 14 mL  14 mL Intravenous Once PRN Antionette Fairy, PA-C        Review of Systems  Constitutional: Positive for weight loss.  HENT: Negative.   Eyes: Negative.   Respiratory: Negative.  Negative for shortness of breath.   Cardiovascular: Negative.  Negative for chest pain.  Gastrointestinal: Negative.  Negative for abdominal pain.  Genitourinary: Negative.   Musculoskeletal: Negative.   Skin: Negative.   Neurological: Negative.   Endo/Heme/Allergies: Negative.   Psychiatric/Behavioral: Negative.   All other systems reviewed and are negative.  14 point ROS was done and is otherwise as detailed above or in HPI   PHYSICAL EXAMINATION: ECOG PERFORMANCE STATUS: 0 - Asymptomatic  Vitals:   10/31/16 1300  BP: (!) 133/51  Pulse: 90  Resp: 18  Temp: 97.7 F (36.5 C)   Filed Weights   10/31/16 1300    Weight: 141 lb 6.4 oz (64.1 kg)     Physical Exam  Constitutional: She is oriented to person, place, and time and well-developed, well-nourished, and in no distress.  HENT:  Head: Normocephalic and atraumatic.  Eyes: Conjunctivae and EOM are normal. Pupils are equal, round, and reactive to light.  Neck: Normal range of motion. Neck supple.  Cardiovascular: Normal rate, regular rhythm and normal heart sounds.   Pulmonary/Chest: Effort normal and breath sounds normal.    Abdominal: Soft. Bowel sounds are normal.  Musculoskeletal: Normal range of motion.  Neurological: She is alert and oriented to person, place, and time. Gait normal.  Skin: Skin is warm and dry.  Nursing note and vitals reviewed.     LABORATORY DATA:  I have reviewed the data as listed Lab Results  Component Value Date   WBC 8.2 03/01/2011   HGB 14.6 10/28/2014   HCT 43.0 10/28/2014   MCV 85.7 03/01/2011   PLT 299 03/01/2011   CMP     Component Value Date/Time   NA 143 10/28/2014 1638   K 3.6 10/28/2014 1638   CL 105 10/28/2014 1638   CO2 21 03/02/2011 0541   GLUCOSE 118 (H) 10/28/2014 1638   BUN 7 10/28/2014 1638   CREATININE 0.70 09/03/2016 0905   CALCIUM 9.0 03/02/2011 0541   PROT 8.6 (H) 03/01/2011 1614   ALBUMIN 4.0 03/01/2011 1614   AST 17 03/01/2011 1614   ALT 9 03/01/2011 1614   ALKPHOS 96 03/01/2011 1614   BILITOT 0.3 03/01/2011 1614   GFRNONAA >60 03/02/2011 0541   GFRAA >60 03/02/2011 0541     RADIOGRAPHIC STUDIES: I have personally reviewed the radiological images as listed and agreed with the findings in the report. No results found.   BILATERAL BREAST MRI WITH AND WITHOUT CONTRAST 09/03/2016  IMPRESSION: 1. Architectural distortion involving the lower outer quadrant of the left breast. The tissue marker clip placed at the time of ultrasound biopsy is within the area of distortion. 2. Approximate 2.8 cm linear non mass enhancement extending posteriorly from the biopsied  mass in the lower outer quadrant of the left breast, likely DCIS. There is no mammographic correlate. 3. No pathologic lymphadenopathy.   MRI GUIDED CORE NEEDLE BIOPSY OF THE LEFT BREAST 10/28/2016  IMPRESSION: MRI guided biopsy of non mass enhancement in the left breast. No apparent complications.  ADDENDUM: Pathology revealed GRADE I INVASIVE DUCTAL CARCINOMA, DUCTAL CARCINOMA IN SITU of the Left breast, lower outer. This was found to be concordant by Dr. Dorise Bullion.   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 She is ER 100% positive, PR 80% positive.   Discussed treatment options including lumpectomy followed by adjuvant RT and endocrine therapy with the patient vs treatment with complete right mastectomy. She stated she is interested in left mastectomy for treatment. I will refer her to Dr. Arnoldo Morale for surgical evaluation.  RTC in 4 weeks for follow up.   All questions were answered. The patient knows to call the clinic with any problems, questions or concerns.  This document serves as a record of services personally performed by Twana First, MD. It was created on her behalf by Shirlean Mylar, a trained medical scribe. The creation of this record is based on the scribe's personal observations and the provider's statements to them. This document has been checked and approved by the attending provider.  I have reviewed the above documentation for accuracy and completeness and I agree with the above.  This note was electronically signed.    [employer]  10/31/2016 1:13 PM

## 2016-11-14 ENCOUNTER — Encounter (INDEPENDENT_AMBULATORY_CARE_PROVIDER_SITE_OTHER): Payer: Self-pay | Admitting: *Deleted

## 2016-11-19 ENCOUNTER — Encounter: Payer: Self-pay | Admitting: General Surgery

## 2016-11-19 ENCOUNTER — Ambulatory Visit (INDEPENDENT_AMBULATORY_CARE_PROVIDER_SITE_OTHER): Payer: Medicare Other | Admitting: General Surgery

## 2016-11-19 VITALS — BP 163/64 | HR 83 | Temp 98.0°F | Resp 18 | Ht 60.0 in | Wt 142.0 lb

## 2016-11-19 DIAGNOSIS — Z170 Estrogen receptor positive status [ER+]: Secondary | ICD-10-CM

## 2016-11-19 DIAGNOSIS — C50412 Malignant neoplasm of upper-outer quadrant of left female breast: Secondary | ICD-10-CM | POA: Diagnosis not present

## 2016-11-19 NOTE — Patient Instructions (Signed)
Total or Modified Radical Mastectomy A total mastectomy and a modified radical mastectomy are types of surgery for breast cancer. If you are having a total mastectomy (simple mastectomy), your entire breast will be removed. If you are having a modified radical mastectomy, your breast and nipple will be removed along with the lymph nodes under your arm. You may also have some of the lining over the muscle tissues under your breast removed. Let your health care provider know about:  Any allergies you have.  All medicines you are taking, including vitamins, herbs, eye drops, creams, and over-the-counter medicines.  Previous problems you or members of your family have had with the use of anesthetics.  Any blood disorders you have.  Any surgeries you have had.  Any medical conditions you have. What are the risks? Generally, this is a safe procedure. However, problems may occur, including:  Pain.  Infection.  Bleeding.  Scar tissue.  Chest numbness on the side of the surgery.  Fluid buildup under the skin flaps where your breast was removed (seroma).  Sensation of throbbing or tingling.  Stress or sadness from losing your breast. If you have the lymph nodes under your arm removed, you may have arm swelling, weakness, or numbness on the same side of your body as your surgery. What happens before the procedure?  Ask your health care provider about:  Changing or stopping your regular medicines. This is especially important if you are taking diabetes medicines or blood thinners.  Taking medicines such as aspirin and ibuprofen. These medicines can thin your blood. Do not take these medicines before your procedure if your health care provider instructs you not to.  Follow your health care provider's instructions about eating or drinking restrictions.  You may be checked for extra fluid around your lymph nodes (lymphedema).  Plan to have someone take you home after the  procedure. What happens during the procedure?  An IV tube will be inserted into one of your veins.  You will be given a medicine that makes you fall asleep (general anesthetic).  Your breast will be cleaned with a germ-killing solution (antiseptic).  A wide incision will be made around your nipple. The skin and nipple inside the incision will be removed along with all breast tissue.  If you are having a modified radical mastectomy:  The lining over your chest muscles will be removed.  The incision may be extended to reach the lymph nodes under your arm, or a second incision may be made.  The lymph nodes will be removed.  You may have a drainage tube inserted into your incision to collect fluid that builds up after surgery. This tube is connected to a suction bulb.  Your incision or incisions will be closed with stitches (sutures).  A bandage (dressing) will be placed over your breast and under your arm. The procedure may vary among health care providers and hospitals. What happens after the procedure?  You will be moved to a recovery area.  Your blood pressure, heart rate, breathing rate, and blood oxygen level will be monitored often until the medicines you were given have worn off.  You will be given pain medicine as needed.  After a while, you will be taken to a hospital room.  You will be encouraged to get up and walk as soon as you can.  Your IV tube can be removed when you are able to eat and drink.  Your drain may be removed before you go home from  the hospital, or you may be sent home with your drain and suction bulb. This information is not intended to replace advice given to you by your health care provider. Make sure you discuss any questions you have with your health care provider. Document Released: 04/09/2001 Document Revised: 03/21/2016 Document Reviewed: 03/30/2014 Elsevier Interactive Patient Education  2017 [employer].

## 2016-11-19 NOTE — H&P (Signed)
Michelle Henry; [id number]; [birth year]   HPI Patient is a 71 year old black female status post right modified radical mastectomy in the past who now presents with a new invasive ductal carcinoma the left breast. She is ER/PR positive. She was referred by Dr. Talbert Cage of oncology for evaluation treatment. The patient denies palpating a mass. No nipple discharge has been noted. She denies any pain in the left breast.     Past Medical History:  Diagnosis Date  . Blood transfusion   . Cancer ([place])   . Chronic kidney disease   . COPD (chronic obstructive pulmonary disease) ([place])   . Diabetes mellitus   . Dysphagia   . GERD (gastroesophageal reflux disease)   . High cholesterol   . Hypertension   . Shortness of breath          Past Surgical History:  Procedure Laterality Date  . BREAST SURGERY    . ESOPHAGOGASTRODUODENOSCOPY (EGD) WITH ESOPHAGEAL DILATION  11/2012   [facility]  . MASTECTOMY  right side  . TONSILLECTOMY      No family history on file.        Current Outpatient Prescriptions on File Prior to Visit  Medication Sig Dispense Refill  . albuterol (PROVENTIL HFA;VENTOLIN HFA) 108 (90 BASE) MCG/ACT inhaler Inhale 1-2 puffs into the lungs every 6 (six) hours as needed for wheezing or shortness of breath.    Marland Kitchen aspirin EC 81 MG tablet Take 81 mg by mouth daily.    . beclomethasone (QVAR) 40 MCG/ACT inhaler Inhale 1 puff into the lungs every morning. *May take additional puff as needed    . benazepril (LOTENSIN) 20 MG tablet Take 20 mg by mouth daily.    . Iron-Vitamin C 65-125 MG TABS Take 1 tablet by mouth daily.    Marland Kitchen lovastatin (MEVACOR) 20 MG tablet Take 20 mg by mouth at bedtime.    . metFORMIN (GLUCOPHAGE) 500 MG tablet     . naproxen (NAPROSYN) 500 MG tablet Take 500 mg by mouth 2 (two) times daily with a meal.    . omeprazole (PRILOSEC) 20 MG capsule Take 20 mg by mouth daily.              Current Facility-Administered Medications on  File Prior to Visit  Medication Dose Route Frequency Provider Last Rate Last Dose  . gadobenate dimeglumine (MULTIHANCE) injection 14 mL  14 mL Intravenous Once PRN Antionette Fairy, PA-C        No Known Allergies     History  Alcohol Use No        History  Smoking Status  . Current Every Day Smoker  . Packs/day: 0.75  . Years: 53.00  . Types: Cigarettes  Smokeless Tobacco  . Never Used    Review of Systems  Constitutional: Negative.   HENT: Positive for sinus pain.   Eyes: Negative.   Respiratory: Positive for shortness of breath.   Cardiovascular: Negative.   Gastrointestinal: Positive for heartburn.  Genitourinary: Positive for frequency.  Musculoskeletal: Negative.   Skin: Negative.   Neurological: Negative.   Endo/Heme/Allergies: Negative.   Psychiatric/Behavioral: Negative.     Objective      Vitals:   11/19/16 0935  BP: (!) 163/64  Pulse: 83  Resp: 18  Temp: 98 F (36.7 C)    Physical Exam  Constitutional: She is oriented to person, place, and time and well-developed, well-nourished, and in no distress.  HENT:  Head: Normocephalic and atraumatic.  Neck: Normal range of motion.  Neck supple.  Cardiovascular: Normal rate, regular rhythm and normal heart sounds.   No murmur heard. Pulmonary/Chest: Effort normal and breath sounds normal. She has no wheezes. She has no rales.  Neurological: She is alert and oriented to person, place, and time.  Skin: Skin is warm and dry.  Vitals reviewed.  Breast:  Status post right modified radical mastectomy. Surgical scar well healed. No masses noted. Left breast examination reveals no dominant mass, nipple discharge, dimpling. Axilla is negative for palpable nodes.  Oncology notes, mammography, pathology reports all reviewed.  Assessment  Left breast invasive ductal carcinoma Plan    As patient already has had a right modified radical mastectomy in the past, she would like to proceed with a left  modified radical mastectomy. This has been scheduled for 12/06/2016. The risks and benefits of the procedure including bleeding, infection, cardiopulmonary difficulties, and the possibility of left arm swelling were fully explained to the patient, who gave informed consent.

## 2016-11-19 NOTE — Progress Notes (Signed)
Michelle Henry; [id number]; 08-Mar-1946   HPI Patient is a 71 year old black female status post right modified radical mastectomy in the past who now presents with a new invasive ductal carcinoma the left breast. She is ER/PR positive. She was referred by Dr. Talbert Cage of oncology for evaluation treatment. The patient denies palpating a mass. No nipple discharge has been noted. She denies any pain in the left breast. Past Medical History:  Diagnosis Date  . Blood transfusion   . Cancer ([place])   . Chronic kidney disease   . COPD (chronic obstructive pulmonary disease) ([place])   . Diabetes mellitus   . Dysphagia   . GERD (gastroesophageal reflux disease)   . High cholesterol   . Hypertension   . Shortness of breath     Past Surgical History:  Procedure Laterality Date  . BREAST SURGERY    . ESOPHAGOGASTRODUODENOSCOPY (EGD) WITH ESOPHAGEAL DILATION  11/2012   [facility]  . MASTECTOMY  right side  . TONSILLECTOMY      No family history on file.  Current Outpatient Prescriptions on File Prior to Visit  Medication Sig Dispense Refill  . albuterol (PROVENTIL HFA;VENTOLIN HFA) 108 (90 BASE) MCG/ACT inhaler Inhale 1-2 puffs into the lungs every 6 (six) hours as needed for wheezing or shortness of breath.    Marland Kitchen aspirin EC 81 MG tablet Take 81 mg by mouth daily.    . beclomethasone (QVAR) 40 MCG/ACT inhaler Inhale 1 puff into the lungs every morning. *May take additional puff as needed    . benazepril (LOTENSIN) 20 MG tablet Take 20 mg by mouth daily.    . Iron-Vitamin C 65-125 MG TABS Take 1 tablet by mouth daily.    Marland Kitchen lovastatin (MEVACOR) 20 MG tablet Take 20 mg by mouth at bedtime.    . metFORMIN (GLUCOPHAGE) 500 MG tablet     . naproxen (NAPROSYN) 500 MG tablet Take 500 mg by mouth 2 (two) times daily with a meal.    . omeprazole (PRILOSEC) 20 MG capsule Take 20 mg by mouth daily.     Current Facility-Administered Medications on File Prior to Visit  Medication Dose Route Frequency Provider Last Rate  Last Dose  . gadobenate dimeglumine (MULTIHANCE) injection 14 mL  14 mL Intravenous Once PRN Antionette Fairy, PA-C        No Known Allergies  History  Alcohol Use No    History  Smoking Status  . Current Every Day Smoker  . Packs/day: 0.75  . Years: 53.00  . Types: Cigarettes  Smokeless Tobacco  . Never Used    Review of Systems  Constitutional: Negative.   HENT: Positive for sinus pain.   Eyes: Negative.   Respiratory: Positive for shortness of breath.   Cardiovascular: Negative.   Gastrointestinal: Positive for heartburn.  Genitourinary: Positive for frequency.  Musculoskeletal: Negative.   Skin: Negative.   Neurological: Negative.   Endo/Heme/Allergies: Negative.   Psychiatric/Behavioral: Negative.     Objective   Vitals:   11/19/16 0935  BP: (!) 163/64  Pulse: 83  Resp: 18  Temp: 98 F (36.7 C)    Physical Exam  Constitutional: She is oriented to person, place, and time and well-developed, well-nourished, and in no distress.  HENT:  Head: Normocephalic and atraumatic.  Neck: Normal range of motion. Neck supple.  Cardiovascular: Normal rate, regular rhythm and normal heart sounds.   No murmur heard. Pulmonary/Chest: Effort normal and breath sounds normal. She has no wheezes. She has no rales.  Neurological:  She is alert and oriented to person, place, and time.  Skin: Skin is warm and dry.  Vitals reviewed.  Breast:  Status post right modified radical mastectomy. Surgical scar well healed. No masses noted. Left breast examination reveals no dominant mass, nipple discharge, dimpling. Axilla is negative for palpable nodes.  Oncology notes, mammography, pathology reports all reviewed.  Assessment  Left breast invasive ductal carcinoma Plan    As patient already has had a right modified radical mastectomy in the past, she would like to proceed with a left modified radical mastectomy. This has been scheduled for 12/06/2016. The risks and benefits of the  procedure including bleeding, infection, cardiopulmonary difficulties, and the possibility of left arm swelling were fully explained to the patient, who gave informed consent.

## 2016-11-26 ENCOUNTER — Telehealth (INDEPENDENT_AMBULATORY_CARE_PROVIDER_SITE_OTHER): Payer: Self-pay | Admitting: *Deleted

## 2016-11-26 ENCOUNTER — Ambulatory Visit (HOSPITAL_COMMUNITY): Payer: Medicare Other

## 2016-11-26 MED ORDER — PEG 3350-KCL-NA BICARB-NACL 420 G PO SOLR: 4000.0000 mL | Freq: Once | ORAL | 0 refills | Status: DC

## 2016-11-26 NOTE — Telephone Encounter (Signed)
Patient needs trilyte 

## 2016-11-27 NOTE — Patient Instructions (Signed)
Michelle Henry  11/27/2016     @PREFPERIOPPHARMACY @   Your procedure is scheduled on 12/06/2016.  Report to [facility] at 6:15 A.M.  Call this number if you have problems the morning of surgery:  [phone]   Remember:  Do not eat food or drink liquids after midnight.  Take these medicines the morning of surgery with A SIP OF WATER albuterol inhaler and bring with you, Qvar inhaler, Atarax if needed, Prilosec  DO NOT TAKE DIABETIC MEDICATIONS MORNING OF PROCEDURE   Do not wear jewelry, make-up or nail polish.  Do not wear lotions, powders, or perfumes, or deoderant.  Do not shave 48 hours prior to surgery.  Men may shave face and neck.  Do not bring valuables to the hospital.  [facility] is not responsible for any belongings or valuables.  Contacts, dentures or bridgework may not be worn into surgery.  Leave your suitcase in the car.  After surgery it may be brought to your room.  For patients admitted to the hospital, discharge time will be determined by your treatment team.  Patients discharged the day of surgery will not be allowed to drive home.    Please read over the following fact sheets that you were given. Surgical Site Infection Prevention and Anesthesia Post-op Instructions     PATIENT INSTRUCTIONS POST-ANESTHESIA  IMMEDIATELY FOLLOWING SURGERY:  Do not drive or operate machinery for the first twenty four hours after surgery.  Do not make any important decisions for twenty four hours after surgery or while taking narcotic pain medications or sedatives.  If you develop intractable nausea and vomiting or a severe headache please notify your doctor immediately.  FOLLOW-UP:  Please make an appointment with your surgeon as instructed. You do not need to follow up with anesthesia unless specifically instructed to do so.  WOUND CARE INSTRUCTIONS (if applicable):  Keep a dry clean dressing on the anesthesia/puncture wound site if there is drainage.  Once the wound has  quit draining you may leave it open to air.  Generally you should leave the bandage intact for twenty four hours unless there is drainage.  If the epidural site drains for more than 36-48 hours please call the anesthesia department.  QUESTIONS?:  Please feel free to call your physician or the hospital operator if you have any questions, and they will be happy to assist you.      Total or Modified Radical Mastectomy A total mastectomy and a modified radical mastectomy are types of surgery for breast cancer. If you are having a total mastectomy (simple mastectomy), your entire breast will be removed. If you are having a modified radical mastectomy, your breast and nipple will be removed along with the lymph nodes under your arm. You may also have some of the lining over the muscle tissues under your breast removed. Let your health care provider know about:  Any allergies you have.  All medicines you are taking, including vitamins, herbs, eye drops, creams, and over-the-counter medicines.  Previous problems you or members of your family have had with the use of anesthetics.  Any blood disorders you have.  Any surgeries you have had.  Any medical conditions you have. What are the risks? Generally, this is a safe procedure. However, problems may occur, including:  Pain.  Infection.  Bleeding.  Scar tissue.  Chest numbness on the side of the surgery.  Fluid buildup under the skin flaps where your breast was removed (seroma).  Sensation of throbbing  or tingling.  Stress or sadness from losing your breast. If you have the lymph nodes under your arm removed, you may have arm swelling, weakness, or numbness on the same side of your body as your surgery. What happens before the procedure?  Ask your health care provider about:  Changing or stopping your regular medicines. This is especially important if you are taking diabetes medicines or blood thinners.  Taking medicines such as  aspirin and ibuprofen. These medicines can thin your blood. Do not take these medicines before your procedure if your health care provider instructs you not to.  Follow your health care provider's instructions about eating or drinking restrictions.  You may be checked for extra fluid around your lymph nodes (lymphedema).  Plan to have someone take you home after the procedure. What happens during the procedure?  An IV tube will be inserted into one of your veins.  You will be given a medicine that makes you fall asleep (general anesthetic).  Your breast will be cleaned with a germ-killing solution (antiseptic).  A wide incision will be made around your nipple. The skin and nipple inside the incision will be removed along with all breast tissue.  If you are having a modified radical mastectomy:  The lining over your chest muscles will be removed.  The incision may be extended to reach the lymph nodes under your arm, or a second incision may be made.  The lymph nodes will be removed.  You may have a drainage tube inserted into your incision to collect fluid that builds up after surgery. This tube is connected to a suction bulb.  Your incision or incisions will be closed with stitches (sutures).  A bandage (dressing) will be placed over your breast and under your arm. The procedure may vary among health care providers and hospitals. What happens after the procedure?  You will be moved to a recovery area.  Your blood pressure, heart rate, breathing rate, and blood oxygen level will be monitored often until the medicines you were given have worn off.  You will be given pain medicine as needed.  After a while, you will be taken to a hospital room.  You will be encouraged to get up and walk as soon as you can.  Your IV tube can be removed when you are able to eat and drink.  Your drain may be removed before you go home from the hospital, or you may be sent home with your drain  and suction bulb. This information is not intended to replace advice given to you by your health care provider. Make sure you discuss any questions you have with your health care provider. Document Released: 04/09/2001 Document Revised: 03/21/2016 Document Reviewed: 03/30/2014 Elsevier Interactive Patient Education  2017 [employer].

## 2016-12-02 ENCOUNTER — Encounter (HOSPITAL_COMMUNITY)
Admission: RE | Admit: 2016-12-02 | Discharge: 2016-12-02 | Disposition: A | Discharge status: Home / Self Care | Payer: Medicare Other | Source: Ambulatory Visit | Attending: General Surgery | Admitting: General Surgery

## 2016-12-02 ENCOUNTER — Encounter (HOSPITAL_COMMUNITY): Payer: Self-pay

## 2016-12-02 ENCOUNTER — Ambulatory Visit (HOSPITAL_COMMUNITY): Payer: Medicare Other

## 2016-12-02 ENCOUNTER — Ambulatory Visit (HOSPITAL_COMMUNITY)
Admission: RE | Admit: 2016-12-02 | Discharge: 2016-12-02 | Disposition: A | Discharge status: Home / Self Care | Payer: Medicare Other | Source: Ambulatory Visit | Attending: General Surgery | Admitting: General Surgery

## 2016-12-02 ENCOUNTER — Other Ambulatory Visit: Payer: Self-pay

## 2016-12-02 DIAGNOSIS — C50912 Malignant neoplasm of unspecified site of left female breast: Secondary | ICD-10-CM | POA: Insufficient documentation

## 2016-12-02 DIAGNOSIS — Z01810 Encounter for preprocedural cardiovascular examination: Secondary | ICD-10-CM | POA: Insufficient documentation

## 2016-12-02 DIAGNOSIS — Z01818 Encounter for other preprocedural examination: Secondary | ICD-10-CM | POA: Diagnosis present

## 2016-12-02 DIAGNOSIS — Z01812 Encounter for preprocedural laboratory examination: Secondary | ICD-10-CM | POA: Diagnosis not present

## 2016-12-02 LAB — SURGICAL PCR SCREEN
MRSA, PCR: NEGATIVE
Staphylococcus aureus: POSITIVE — AB

## 2016-12-02 LAB — COMPREHENSIVE METABOLIC PANEL
ALT: 13 U/L — ABNORMAL LOW (ref 14–54)
AST: 21 U/L (ref 15–41)
Albumin: 3.8 g/dL (ref 3.5–5.0)
Alkaline Phosphatase: 80 U/L (ref 38–126)
Anion gap: 10 (ref 5–15)
BUN: 10 mg/dL (ref 6–20)
CO2: 24 mmol/L (ref 22–32)
Calcium: 9.1 mg/dL (ref 8.9–10.3)
Chloride: 107 mmol/L (ref 101–111)
Creatinine, Ser: 0.74 mg/dL (ref 0.44–1.00)
GFR calc Af Amer: >60 mL/min (ref >60)
GFR calc non Af Amer: >60 mL/min (ref >60)
Glucose, Bld: 100 mg/dL — ABNORMAL HIGH (ref 65–99)
Potassium: 3.7 mmol/L (ref 3.5–5.1)
Sodium: 141 mmol/L (ref 135–145)
Total Bilirubin: 0.4 mg/dL (ref 0.3–1.2)
Total Protein: 6.8 g/dL (ref 6.5–8.1)

## 2016-12-02 LAB — CBC WITH DIFFERENTIAL/PLATELET
Basophils Absolute: 0 10*3/uL (ref 0.0–0.1)
Basophils Relative: 0 %
Eosinophils Absolute: 0.2 10*3/uL (ref 0.0–0.7)
Eosinophils Relative: 3 %
HCT: 34.5 % — ABNORMAL LOW (ref 36.0–46.0)
Hemoglobin: 11.6 g/dL — ABNORMAL LOW (ref 12.0–15.0)
Lymphocytes Relative: 48 %
Lymphs Abs: 3.2 10*3/uL (ref 0.7–4.0)
MCH: 29.6 pg (ref 26.0–34.0)
MCHC: 33.6 g/dL (ref 30.0–36.0)
MCV: 88 fL (ref 78.0–100.0)
Monocytes Absolute: 0.4 10*3/uL (ref 0.1–1.0)
Monocytes Relative: 6 %
Neutro Abs: 2.9 10*3/uL (ref 1.7–7.7)
Neutrophils Relative %: 43 %
Platelets: 255 10*3/uL (ref 150–400)
RBC: 3.92 MIL/uL (ref 3.87–5.11)
RDW: 13.5 % (ref 11.5–15.5)
WBC: 6.8 10*3/uL (ref 4.0–10.5)

## 2016-12-02 LAB — TYPE AND SCREEN
ABO/RH(D): B POS
Antibody Screen: NEGATIVE

## 2016-12-02 IMAGING — DX DG CHEST 2V
2 series · 2 of 2 positions shown · non-contrast
Comparison: Chest two-view [DATE], chest CT [DATE]

CLINICAL DATA: Preop breast cancer

EXAM:
CHEST  2 VIEW

[chest pa]
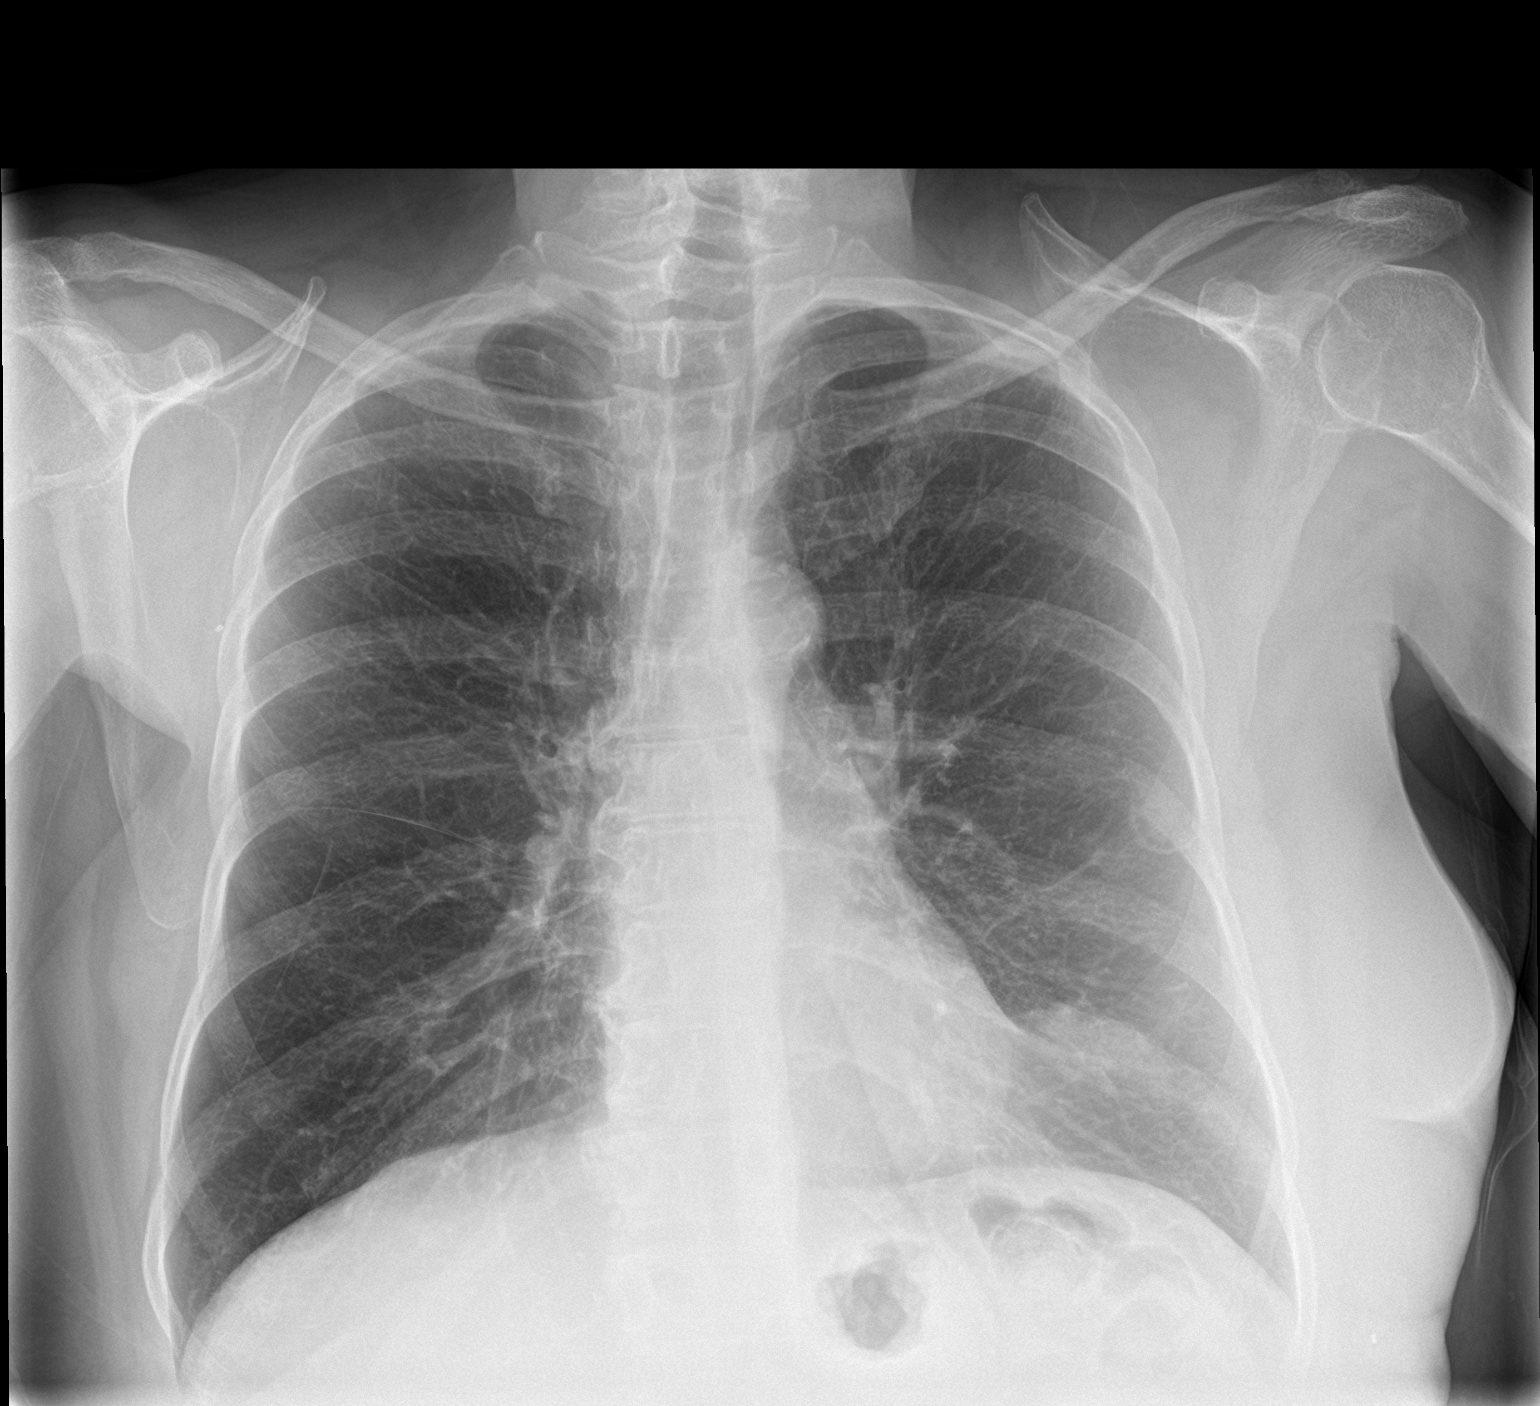

[chest lat]
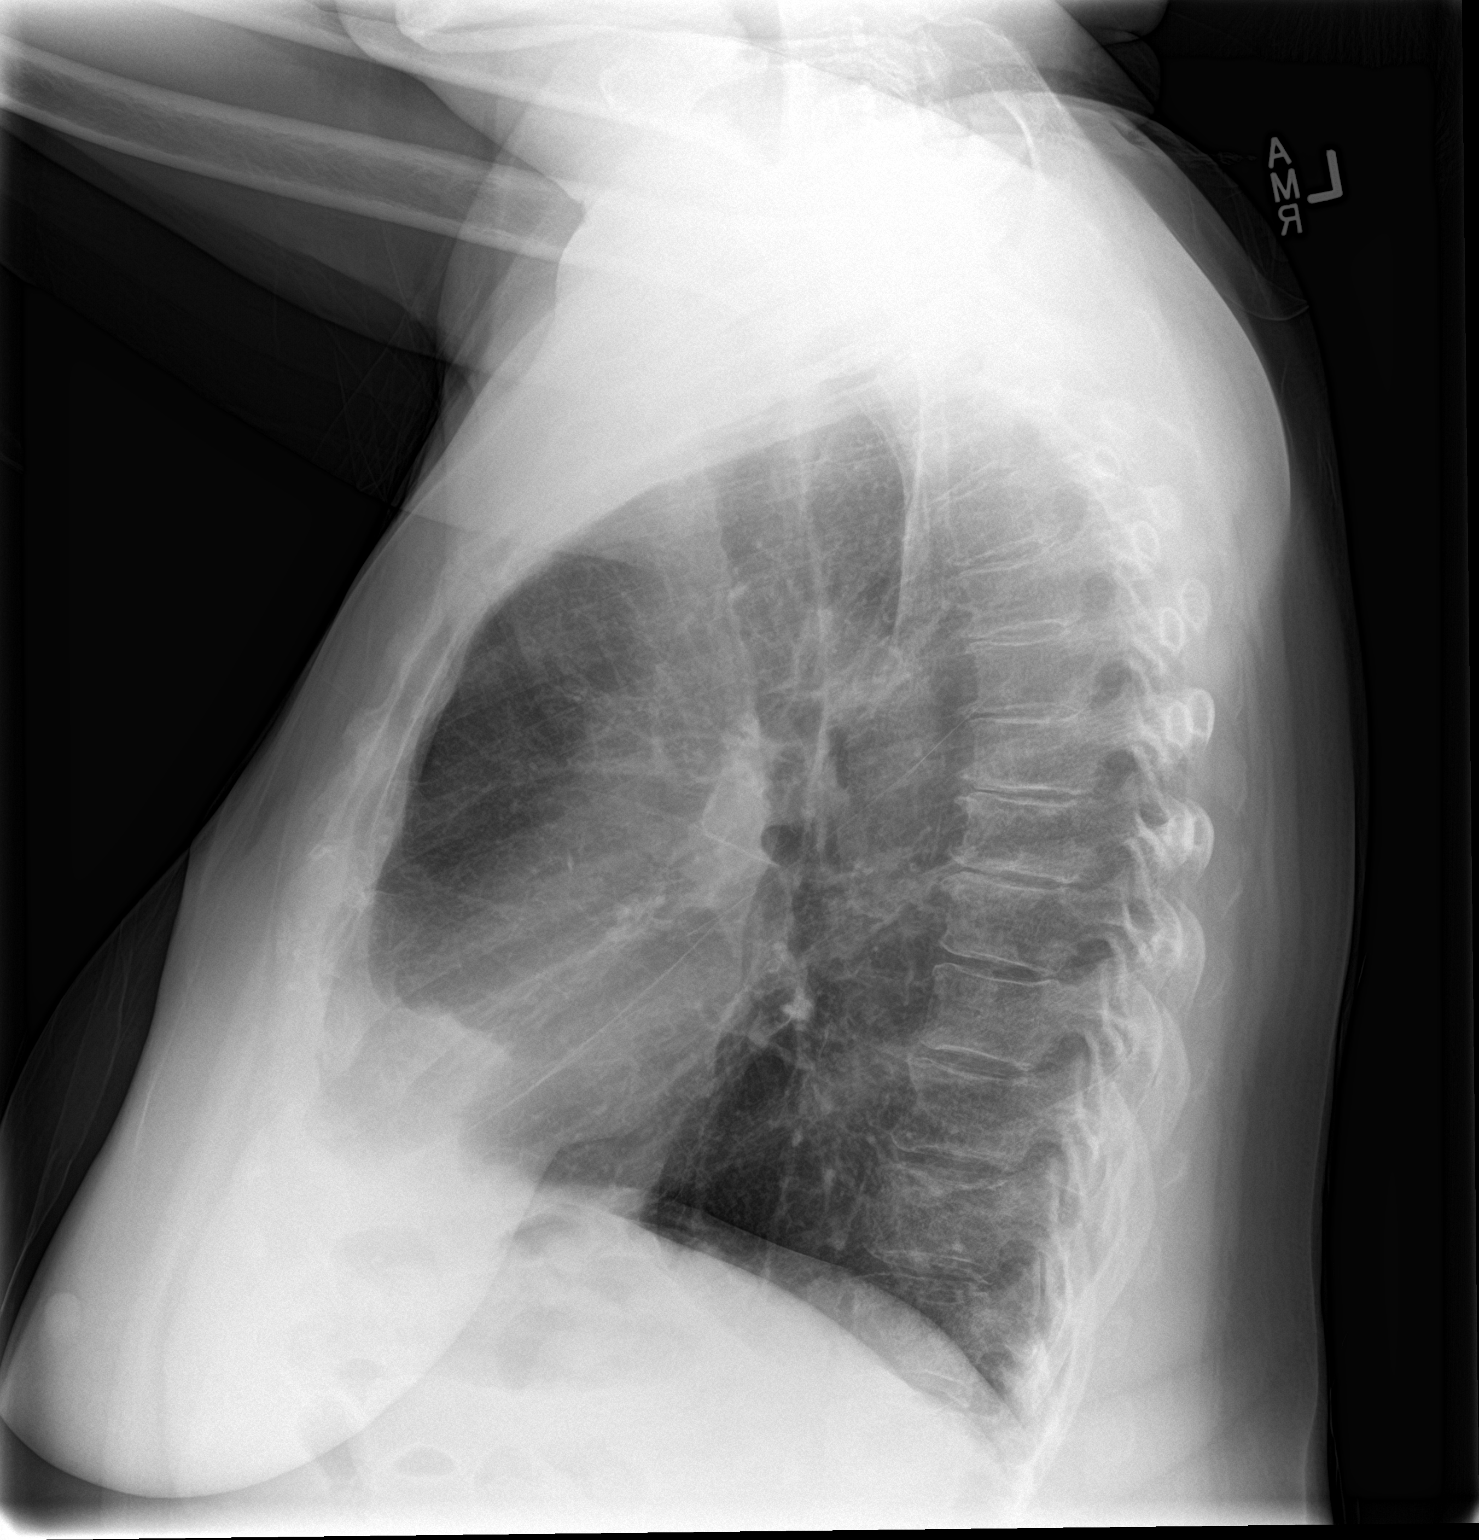

[2 of 2 positions shown; findings below may reference images not displayed]

FINDINGS: Heart size and vascularity normal. Lingular density similar to the
prior study compatible with epicardial fat and mild atelectasis or
scarring in the lingula.

Negative for mass or lung nodule.  No pleural effusion.
IMPRESSION: No active cardiopulmonary disease.

## 2016-12-03 MED ORDER — MUPIROCIN 2 % EX OINT
TOPICAL_OINTMENT | CUTANEOUS | Status: AC
  Filled 2016-12-03: qty 22

## 2016-12-05 ENCOUNTER — Ambulatory Visit (HOSPITAL_COMMUNITY): Payer: Medicare Other

## 2016-12-06 ENCOUNTER — Ambulatory Visit (HOSPITAL_COMMUNITY)
Admission: RE | Admit: 2016-12-06 | Discharge: 2016-12-07 | Disposition: A | Discharge status: Home / Self Care | Payer: Medicare Other | Source: Ambulatory Visit | Attending: General Surgery | Admitting: General Surgery

## 2016-12-06 ENCOUNTER — Ambulatory Visit (HOSPITAL_COMMUNITY): Payer: Medicare Other | Admitting: Anesthesiology

## 2016-12-06 ENCOUNTER — Encounter (HOSPITAL_COMMUNITY)
Admission: RE | Disposition: A | Discharge status: Home / Self Care | Payer: Self-pay | Source: Ambulatory Visit | Attending: General Surgery

## 2016-12-06 ENCOUNTER — Encounter (HOSPITAL_COMMUNITY): Payer: Self-pay | Admitting: *Deleted

## 2016-12-06 DIAGNOSIS — E1122 Type 2 diabetes mellitus with diabetic chronic kidney disease: Secondary | ICD-10-CM | POA: Diagnosis not present

## 2016-12-06 DIAGNOSIS — F17210 Nicotine dependence, cigarettes, uncomplicated: Secondary | ICD-10-CM | POA: Diagnosis not present

## 2016-12-06 DIAGNOSIS — K219 Gastro-esophageal reflux disease without esophagitis: Secondary | ICD-10-CM | POA: Diagnosis not present

## 2016-12-06 DIAGNOSIS — N189 Chronic kidney disease, unspecified: Secondary | ICD-10-CM | POA: Diagnosis not present

## 2016-12-06 DIAGNOSIS — J449 Chronic obstructive pulmonary disease, unspecified: Secondary | ICD-10-CM | POA: Diagnosis not present

## 2016-12-06 DIAGNOSIS — Z853 Personal history of malignant neoplasm of breast: Secondary | ICD-10-CM | POA: Insufficient documentation

## 2016-12-06 DIAGNOSIS — Z170 Estrogen receptor positive status [ER+]: Secondary | ICD-10-CM | POA: Diagnosis not present

## 2016-12-06 DIAGNOSIS — Z791 Long term (current) use of non-steroidal anti-inflammatories (NSAID): Secondary | ICD-10-CM | POA: Insufficient documentation

## 2016-12-06 DIAGNOSIS — Z7982 Long term (current) use of aspirin: Secondary | ICD-10-CM | POA: Diagnosis not present

## 2016-12-06 DIAGNOSIS — E7800 Pure hypercholesterolemia, unspecified: Secondary | ICD-10-CM | POA: Insufficient documentation

## 2016-12-06 DIAGNOSIS — C50912 Malignant neoplasm of unspecified site of left female breast: Secondary | ICD-10-CM | POA: Diagnosis not present

## 2016-12-06 DIAGNOSIS — Z7951 Long term (current) use of inhaled steroids: Secondary | ICD-10-CM | POA: Insufficient documentation

## 2016-12-06 DIAGNOSIS — Z79899 Other long term (current) drug therapy: Secondary | ICD-10-CM | POA: Diagnosis not present

## 2016-12-06 DIAGNOSIS — Z7984 Long term (current) use of oral hypoglycemic drugs: Secondary | ICD-10-CM | POA: Diagnosis not present

## 2016-12-06 DIAGNOSIS — I129 Hypertensive chronic kidney disease with stage 1 through stage 4 chronic kidney disease, or unspecified chronic kidney disease: Secondary | ICD-10-CM | POA: Insufficient documentation

## 2016-12-06 HISTORY — PX: MASTECTOMY MODIFIED RADICAL: SHX5962

## 2016-12-06 LAB — GLUCOSE, CAPILLARY
Glucose-Capillary: 93 mg/dL (ref 65–99)
Glucose-Capillary: 119 mg/dL — ABNORMAL HIGH (ref 65–99)
Glucose-Capillary: 181 mg/dL — ABNORMAL HIGH (ref 65–99)
Glucose-Capillary: 143 mg/dL — ABNORMAL HIGH (ref 65–99)
Glucose-Capillary: 177 mg/dL — ABNORMAL HIGH (ref 65–99)

## 2016-12-06 SURGERY — MASTECTOMY, MODIFIED RADICAL
Anesthesia: General | Site: Breast | Laterality: Left

## 2016-12-06 MED ORDER — GLYCOPYRROLATE 0.2 MG/ML IJ SOLN
INTRAMUSCULAR | Status: DC | PRN
  Administered 2016-12-06: 0.6 mg via INTRAVENOUS

## 2016-12-06 MED ORDER — DEXAMETHASONE SODIUM PHOSPHATE 4 MG/ML IJ SOLN
INTRAMUSCULAR | Status: AC
  Filled 2016-12-06: qty 2

## 2016-12-06 MED ORDER — BUDESONIDE 0.25 MG/2ML IN SUSP
0.2500 mg | Freq: Two times a day (BID) | RESPIRATORY_TRACT | Status: DC
  Administered 2016-12-06 – 2016-12-07 (×2): 0.25 mg via RESPIRATORY_TRACT
  Filled 2016-12-06 (×2): qty 2

## 2016-12-06 MED ORDER — 0.9 % SODIUM CHLORIDE (POUR BTL) OPTIME
TOPICAL | Status: DC | PRN
  Administered 2016-12-06: 1000 mL

## 2016-12-06 MED ORDER — PHENYLEPHRINE 40 MCG/ML (10ML) SYRINGE FOR IV PUSH (FOR BLOOD PRESSURE SUPPORT)
PREFILLED_SYRINGE | INTRAVENOUS | Status: AC
  Filled 2016-12-06: qty 10

## 2016-12-06 MED ORDER — HYDROMORPHONE HCL 1 MG/ML IJ SOLN
INTRAMUSCULAR | Status: AC
  Filled 2016-12-06: qty 1

## 2016-12-06 MED ORDER — LIDOCAINE HCL (CARDIAC) 20 MG/ML IV SOLN
INTRAVENOUS | Status: DC | PRN
  Administered 2016-12-06: 30 mg via INTRAVENOUS

## 2016-12-06 MED ORDER — FENTANYL CITRATE (PF) 250 MCG/5ML IJ SOLN
INTRAMUSCULAR | Status: AC
  Filled 2016-12-06: qty 5

## 2016-12-06 MED ORDER — ACETAMINOPHEN 325 MG PO TABS: 650.0000 mg | ORAL_TABLET | Freq: Four times a day (QID) | ORAL | Status: DC | PRN

## 2016-12-06 MED ORDER — POVIDONE-IODINE 10 % EX OINT
TOPICAL_OINTMENT | CUTANEOUS | Status: AC
  Filled 2016-12-06: qty 1

## 2016-12-06 MED ORDER — LORAZEPAM 2 MG/ML IJ SOLN
1.0000 mg | INTRAMUSCULAR | Status: DC | PRN
  Administered 2016-12-06: 1 mg via INTRAVENOUS
  Filled 2016-12-06: qty 1

## 2016-12-06 MED ORDER — ENOXAPARIN SODIUM 40 MG/0.4ML ~~LOC~~ SOLN
40.0000 mg | Freq: Once | SUBCUTANEOUS | Status: AC
  Administered 2016-12-06: 40 mg via SUBCUTANEOUS
  Filled 2016-12-06: qty 0.4

## 2016-12-06 MED ORDER — FENTANYL CITRATE (PF) 100 MCG/2ML IJ SOLN
INTRAMUSCULAR | Status: DC | PRN
  Administered 2016-12-06 (×3): 50 ug via INTRAVENOUS

## 2016-12-06 MED ORDER — HYDROCODONE-ACETAMINOPHEN 5-325 MG PO TABS
1.0000 | ORAL_TABLET | ORAL | Status: DC | PRN
  Administered 2016-12-07: 2 via ORAL
  Filled 2016-12-06 (×2): qty 2

## 2016-12-06 MED ORDER — ONDANSETRON HCL 4 MG/2ML IJ SOLN
4.0000 mg | Freq: Once | INTRAMUSCULAR | Status: AC
Start: 2016-12-06 — End: 2016-12-06
  Administered 2016-12-06: 4 mg via INTRAVENOUS
  Filled 2016-12-06: qty 2

## 2016-12-06 MED ORDER — PROPOFOL 10 MG/ML IV BOLUS
INTRAVENOUS | Status: DC | PRN
  Administered 2016-12-06: 100 mg via INTRAVENOUS

## 2016-12-06 MED ORDER — MUPIROCIN 2 % EX OINT
1.0000 "application " | TOPICAL_OINTMENT | Freq: Two times a day (BID) | CUTANEOUS | Status: DC
  Administered 2016-12-06: 1 via TOPICAL

## 2016-12-06 MED ORDER — DIPHENHYDRAMINE HCL 25 MG PO CAPS: 25.0000 mg | ORAL_CAPSULE | Freq: Four times a day (QID) | ORAL | Status: DC | PRN

## 2016-12-06 MED ORDER — NEOSTIGMINE METHYLSULFATE 10 MG/10ML IV SOLN
INTRAVENOUS | Status: DC | PRN
  Administered 2016-12-06: 4 mg via INTRAVENOUS

## 2016-12-06 MED ORDER — ROCURONIUM BROMIDE 100 MG/10ML IV SOLN
INTRAVENOUS | Status: DC | PRN
  Administered 2016-12-06: 35 mg via INTRAVENOUS

## 2016-12-06 MED ORDER — CEFAZOLIN SODIUM-DEXTROSE 2-4 GM/100ML-% IV SOLN
2.0000 g | INTRAVENOUS | Status: AC
  Administered 2016-12-06: 2 g via INTRAVENOUS
  Filled 2016-12-06: qty 100

## 2016-12-06 MED ORDER — IPRATROPIUM-ALBUTEROL 0.5-2.5 (3) MG/3ML IN SOLN
RESPIRATORY_TRACT | Status: AC
  Filled 2016-12-06: qty 3

## 2016-12-06 MED ORDER — CHLORHEXIDINE GLUCONATE CLOTH 2 % EX PADS: 6.0000 | MEDICATED_PAD | Freq: Once | CUTANEOUS | Status: DC

## 2016-12-06 MED ORDER — KETOROLAC TROMETHAMINE 30 MG/ML IJ SOLN
30.0000 mg | Freq: Once | INTRAMUSCULAR | Status: AC
  Administered 2016-12-06: 30 mg via INTRAVENOUS
  Filled 2016-12-06: qty 1

## 2016-12-06 MED ORDER — NEOSTIGMINE METHYLSULFATE 10 MG/10ML IV SOLN
INTRAVENOUS | Status: AC
  Filled 2016-12-06: qty 1

## 2016-12-06 MED ORDER — BENAZEPRIL HCL 10 MG PO TABS
20.0000 mg | ORAL_TABLET | Freq: Every day | ORAL | Status: DC
  Administered 2016-12-06 – 2016-12-07 (×2): 20 mg via ORAL
  Filled 2016-12-06 (×2): qty 2

## 2016-12-06 MED ORDER — INSULIN ASPART 100 UNIT/ML ~~LOC~~ SOLN
0.0000 [IU] | Freq: Three times a day (TID) | SUBCUTANEOUS | Status: DC
  Administered 2016-12-06: 2 [IU] via SUBCUTANEOUS
  Administered 2016-12-06: 3 [IU] via SUBCUTANEOUS

## 2016-12-06 MED ORDER — FERROUS SULFATE 325 (65 FE) MG PO TABS
325.0000 mg | ORAL_TABLET | Freq: Every day | ORAL | Status: DC
  Administered 2016-12-07: 325 mg via ORAL
  Filled 2016-12-06: qty 1

## 2016-12-06 MED ORDER — IPRATROPIUM-ALBUTEROL 0.5-2.5 (3) MG/3ML IN SOLN
3.0000 mL | Freq: Once | RESPIRATORY_TRACT | Status: AC
  Administered 2016-12-06: 3 mL via RESPIRATORY_TRACT

## 2016-12-06 MED ORDER — HYDROMORPHONE HCL 1 MG/ML IJ SOLN
1.0000 mg | INTRAMUSCULAR | Status: DC | PRN
  Administered 2016-12-06 – 2016-12-07 (×2): 1 mg via INTRAVENOUS
  Filled 2016-12-06 (×2): qty 1

## 2016-12-06 MED ORDER — ENOXAPARIN SODIUM 40 MG/0.4ML ~~LOC~~ SOLN
40.0000 mg | SUBCUTANEOUS | Status: DC
  Administered 2016-12-07: 40 mg via SUBCUTANEOUS
  Filled 2016-12-06: qty 0.4

## 2016-12-06 MED ORDER — PROPOFOL 10 MG/ML IV BOLUS
INTRAVENOUS | Status: AC
  Filled 2016-12-06: qty 40

## 2016-12-06 MED ORDER — HYDROMORPHONE HCL 1 MG/ML IJ SOLN: 0.2500 mg | INTRAMUSCULAR | Status: DC | PRN

## 2016-12-06 MED ORDER — HEMOSTATIC AGENTS (NO CHARGE) OPTIME
TOPICAL | Status: DC | PRN
  Administered 2016-12-06: 1 via TOPICAL

## 2016-12-06 MED ORDER — HYDROMORPHONE HCL 1 MG/ML IJ SOLN
0.2500 mg | INTRAMUSCULAR | Status: DC | PRN
  Administered 2016-12-06 (×2): 0.5 mg via INTRAVENOUS

## 2016-12-06 MED ORDER — HYDROXYZINE HCL 25 MG PO TABS: 25.0000 mg | ORAL_TABLET | Freq: Three times a day (TID) | ORAL | Status: DC | PRN

## 2016-12-06 MED ORDER — DOCUSATE SODIUM 100 MG PO CAPS
200.0000 mg | ORAL_CAPSULE | Freq: Every day | ORAL | Status: DC
  Administered 2016-12-06 – 2016-12-07 (×2): 200 mg via ORAL
  Filled 2016-12-06 (×2): qty 2

## 2016-12-06 MED ORDER — MIDAZOLAM HCL 2 MG/2ML IJ SOLN
INTRAMUSCULAR | Status: AC
  Filled 2016-12-06: qty 2

## 2016-12-06 MED ORDER — ALBUTEROL SULFATE (2.5 MG/3ML) 0.083% IN NEBU: 3.0000 mL | INHALATION_SOLUTION | Freq: Four times a day (QID) | RESPIRATORY_TRACT | Status: DC | PRN

## 2016-12-06 MED ORDER — MUPIROCIN 2 % EX OINT
TOPICAL_OINTMENT | CUTANEOUS | Status: AC
  Filled 2016-12-06: qty 22

## 2016-12-06 MED ORDER — SUCCINYLCHOLINE CHLORIDE 20 MG/ML IJ SOLN
INTRAMUSCULAR | Status: AC
  Filled 2016-12-06: qty 1

## 2016-12-06 MED ORDER — GLYCOPYRROLATE 0.2 MG/ML IJ SOLN
INTRAMUSCULAR | Status: AC
  Filled 2016-12-06: qty 3

## 2016-12-06 MED ORDER — SODIUM CHLORIDE 0.9 % IJ SOLN
INTRAMUSCULAR | Status: AC
  Filled 2016-12-06: qty 10

## 2016-12-06 MED ORDER — ONDANSETRON 4 MG PO TBDP: 4.0000 mg | ORAL_TABLET | Freq: Four times a day (QID) | ORAL | Status: DC | PRN

## 2016-12-06 MED ORDER — LACTATED RINGERS IV SOLN
INTRAVENOUS | Status: DC
  Administered 2016-12-06 – 2016-12-07 (×2): via INTRAVENOUS

## 2016-12-06 MED ORDER — POVIDONE-IODINE 10 % OINT PACKET
TOPICAL_OINTMENT | CUTANEOUS | Status: DC | PRN
  Administered 2016-12-06: 2 via TOPICAL

## 2016-12-06 MED ORDER — ACETAMINOPHEN 650 MG RE SUPP: 650.0000 mg | Freq: Four times a day (QID) | RECTAL | Status: DC | PRN

## 2016-12-06 MED ORDER — LACTATED RINGERS IV SOLN
INTRAVENOUS | Status: DC
  Administered 2016-12-06: 1000 mL via INTRAVENOUS

## 2016-12-06 MED ORDER — PRAVASTATIN SODIUM 10 MG PO TABS
20.0000 mg | ORAL_TABLET | Freq: Every day | ORAL | Status: DC
  Administered 2016-12-06: 20 mg via ORAL
  Filled 2016-12-06: qty 2

## 2016-12-06 MED ORDER — MIDAZOLAM HCL 2 MG/2ML IJ SOLN
1.0000 mg | INTRAMUSCULAR | Status: DC
  Administered 2016-12-06: 2 mg via INTRAVENOUS

## 2016-12-06 MED ORDER — EPHEDRINE SULFATE 50 MG/ML IJ SOLN
INTRAMUSCULAR | Status: AC
  Filled 2016-12-06: qty 1

## 2016-12-06 MED ORDER — DEXAMETHASONE SODIUM PHOSPHATE 4 MG/ML IJ SOLN
INTRAMUSCULAR | Status: DC | PRN
  Administered 2016-12-06: 4 mg via INTRAVENOUS

## 2016-12-06 MED ORDER — ONDANSETRON HCL 4 MG/2ML IJ SOLN
4.0000 mg | Freq: Four times a day (QID) | INTRAMUSCULAR | Status: DC | PRN
  Administered 2016-12-06: 4 mg via INTRAVENOUS
  Filled 2016-12-06: qty 2

## 2016-12-06 MED ORDER — METFORMIN HCL 500 MG PO TABS
500.0000 mg | ORAL_TABLET | Freq: Every day | ORAL | Status: DC
  Administered 2016-12-06 – 2016-12-07 (×2): 500 mg via ORAL
  Filled 2016-12-06 (×2): qty 1

## 2016-12-06 MED ORDER — SIMETHICONE 80 MG PO CHEW: 40.0000 mg | CHEWABLE_TABLET | Freq: Four times a day (QID) | ORAL | Status: DC | PRN

## 2016-12-06 MED ORDER — LIDOCAINE HCL (PF) 1 % IJ SOLN
INTRAMUSCULAR | Status: AC
  Filled 2016-12-06: qty 5

## 2016-12-06 MED ORDER — ALUM & MAG HYDROXIDE-SIMETH 200-200-20 MG/5ML PO SUSP
15.0000 mL | Freq: Four times a day (QID) | ORAL | Status: DC | PRN
  Administered 2016-12-06 – 2016-12-07 (×2): 15 mL via ORAL
  Filled 2016-12-06 (×2): qty 30

## 2016-12-06 SURGICAL SUPPLY — 39 items
APPLIER CLIP 9.375 SM OPEN (CLIP) ×4
BAG HAMPER (MISCELLANEOUS) ×2 IMPLANT
BINDER BREAST LRG (GAUZE/BANDAGES/DRESSINGS) ×2 IMPLANT
CLIP APPLIE 9.375 SM OPEN (CLIP) ×2 IMPLANT
CLOTH BEACON ORANGE TIMEOUT ST (SAFETY) ×2 IMPLANT
COVER LIGHT HANDLE STERIS (MISCELLANEOUS) ×4 IMPLANT
DRAPE UTILITY W/TAPE 26X15 (DRAPES) ×2 IMPLANT
ELECT REM PT RETURN 9FT ADLT (ELECTROSURGICAL) ×2
ELECTRODE REM PT RTRN 9FT ADLT (ELECTROSURGICAL) ×1 IMPLANT
EVACUATOR DRAINAGE 10X20 100CC (DRAIN) ×1 IMPLANT
EVACUATOR SILICONE 100CC (DRAIN) ×1
GAUZE SPONGE 4X4 12PLY STRL (GAUZE/BANDAGES/DRESSINGS) ×2 IMPLANT
GLOVE BIOGEL PI IND STRL 7.0 (GLOVE) ×1 IMPLANT
GLOVE BIOGEL PI INDICATOR 7.0 (GLOVE) ×1
GLOVE SURG SS PI 7.5 STRL IVOR (GLOVE) ×2 IMPLANT
GOWN STRL REUS W/TWL LRG LVL3 (GOWN DISPOSABLE) ×6 IMPLANT
HEMOSTAT ARISTA ABSORB 3G PWDR (MISCELLANEOUS) ×2 IMPLANT
INST SET MINOR GENERAL (KITS) ×2 IMPLANT
KIT ROOM TURNOVER APOR (KITS) ×2 IMPLANT
MANIFOLD NEPTUNE II (INSTRUMENTS) ×2 IMPLANT
NS IRRIG 1000ML POUR BTL (IV SOLUTION) ×2 IMPLANT
PACK MINOR (CUSTOM PROCEDURE TRAY) ×2 IMPLANT
PAD ABD 5X9 TENDERSORB (GAUZE/BANDAGES/DRESSINGS) ×2 IMPLANT
PAD ARMBOARD 7.5X6 YLW CONV (MISCELLANEOUS) ×2 IMPLANT
SET BASIN LINEN APH (SET/KITS/TRAYS/PACK) ×2 IMPLANT
SPONGE DRAIN TRACH 4X4 STRL 2S (GAUZE/BANDAGES/DRESSINGS) ×2 IMPLANT
SPONGE GAUZE 4X4 12PLY (GAUZE/BANDAGES/DRESSINGS) ×2 IMPLANT
SPONGE INTESTINAL PEANUT (DISPOSABLE) IMPLANT
SPONGE LAP 18X18 X RAY DECT (DISPOSABLE) ×4 IMPLANT
STAPLER VISISTAT (STAPLE) ×4 IMPLANT
SUT ETHILON 3 0 FSL (SUTURE) ×2 IMPLANT
SUT SILK 2 0 (SUTURE) ×1
SUT SILK 2 0 SH (SUTURE) ×2 IMPLANT
SUT SILK 2-0 18XBRD TIE 12 (SUTURE) ×1 IMPLANT
SUT VIC AB 2-0 CT1 27 (SUTURE) ×6
SUT VIC AB 2-0 CT1 TAPERPNT 27 (SUTURE) ×6 IMPLANT
SUT VIC AB 3-0 SH 27 (SUTURE)
SUT VIC AB 3-0 SH 27X BRD (SUTURE) IMPLANT
SUT VICRYL AB 2 0 TIES (SUTURE) IMPLANT

## 2016-12-06 NOTE — Anesthesia Procedure Notes (Signed)
Procedure Name: Intubation Date/Time: 12/06/2016 7:35 AM Performed by: Andree Elk, AMY A Pre-anesthesia Checklist: Patient identified, Patient being monitored, Timeout performed, Emergency Drugs available and Suction available Patient Re-evaluated:Patient Re-evaluated prior to inductionOxygen Delivery Method: Circle System Utilized Preoxygenation: Pre-oxygenation with 100% oxygen Intubation Type: IV induction Ventilation: Mask ventilation without difficulty Laryngoscope Size: Miller and 3 Grade View: Grade I Tube type: Oral Tube size: 7.0 mm Number of attempts: 2 (Repositioned head for 2nd attempt) Airway Equipment and Method: Stylet Placement Confirmation: ETT inserted through vocal cords under direct vision,  positive ETCO2 and breath sounds checked- equal and bilateral Secured at: 21 cm Tube secured with: Tape Dental Injury: Teeth and Oropharynx as per pre-operative assessment

## 2016-12-06 NOTE — Anesthesia Postprocedure Evaluation (Signed)
Anesthesia Post Note  Patient: Michelle Henry  Procedure(s) Performed: Procedure(s) (LRB): MASTECTOMY MODIFIED RADICAL (Left)  Patient location during evaluation: Nursing Unit Anesthesia Type: General Level of consciousness: awake and alert Pain management: satisfactory to patient Vital Signs Assessment: post-procedure vital signs reviewed and stable Respiratory status: spontaneous breathing Cardiovascular status: stable Anesthetic complications: no     Last Vitals:  Vitals:   12/06/16 1003 12/06/16 1400  BP: (!) 138/56 134/61  Pulse: (!) 58 67  Resp: 14 14  Temp: 36.4 C 36.5 C    Last Pain:  Vitals:   12/06/16 1400  TempSrc: Oral  PainSc: 6                  Khadeeja Elden

## 2016-12-06 NOTE — Progress Notes (Signed)
Duoneb given via nebublizer treatment, tolerated well.

## 2016-12-06 NOTE — Anesthesia Postprocedure Evaluation (Signed)
Anesthesia Post Note  Patient: Michelle Henry  Procedure(s) Performed: Procedure(s) (LRB): MASTECTOMY MODIFIED RADICAL (Left)  Patient location during evaluation: PACU Anesthesia Type: General Level of consciousness: awake and alert, oriented and patient cooperative Pain management: pain level controlled Vital Signs Assessment: post-procedure vital signs reviewed and stable Respiratory status: spontaneous breathing Cardiovascular status: stable Postop Assessment: no signs of nausea or vomiting Anesthetic complications: no     Last Vitals:  Vitals:   12/06/16 0900 12/06/16 0915  BP: (!) 152/73 (!) 150/65  Pulse: 80 64  Resp: 16 15  Temp:      Last Pain:  Vitals:   12/06/16 0856  TempSrc:   PainSc: 0-No pain                 ADAMS, AMY A

## 2016-12-06 NOTE — Interval H&P Note (Signed)
History and Physical Interval Note:  12/06/2016 7:14 AM  Michelle Henry  has presented today for surgery, with the diagnosis of left breast cancer  The various methods of treatment have been discussed with the patient and family. After consideration of risks, benefits and other options for treatment, the patient has consented to  Procedure(s): MASTECTOMY MODIFIED RADICAL (Left) as a surgical intervention .  The patient's history has been reviewed, patient examined, no change in status, stable for surgery.  I have reviewed the patient's chart and labs.  Questions were answered to the patient's satisfaction.     [name]

## 2016-12-06 NOTE — Care Management CC44 (Signed)
Condition Code 44 Documentation Completed  Patient Details  Name: Michelle Henry MRN: [id number] Date of Birth: 1945/11/17   Condition Code 44 given:   yes Patient signature on Condition Code 44 notice:   yes Documentation of 2 MD's agreement:   yes Code 44 added to claim:   yes    Jomel Whittlesey, Chauncey Reading, RN 12/06/2016, 12:48 PM

## 2016-12-06 NOTE — Transfer of Care (Signed)
Immediate Anesthesia Transfer of Care Note  Patient: Michelle Henry  Procedure(s) Performed: Procedure(s): MASTECTOMY MODIFIED RADICAL (Left)  Patient Location: PACU  Anesthesia Type:General  Level of Consciousness: awake, oriented and patient cooperative  Airway & Oxygen Therapy: Patient Spontanous Breathing and Patient connected to nasal cannula oxygen  Post-op Assessment: Report given to RN and Post -op Vital signs reviewed and stable  Post vital signs: Reviewed and stable  Last Vitals:  Vitals:   12/06/16 0720 12/06/16 0725  BP: (!) 125/54 (!) 125/54  Pulse:    Resp: 20 20  Temp:      Last Pain:  Vitals:   12/06/16 0642  TempSrc: Oral  PainSc: 0-No pain      Patients Stated Pain Goal: 7 (74/71/85 5015)  Complications: No apparent anesthesia complications

## 2016-12-06 NOTE — Op Note (Signed)
Patient:  Michelle Henry  DOB:  [birth year]  MRN:  [id number]   Preop Diagnosis:  Left breast cancer  Postop Diagnosis:  Same  Procedure:  Left modified radical mastectomy  Surgeon:  [name], M.D.  Anes:  Gen. endotracheal  Indications:  Patient is a 71 year old black female with a history of a right breast cancer who now presents with a new left breast cancer. The patient would like to proceed with a left modified radical mastectomy to match the right side. The risks and benefits of the procedure including bleeding, infection, arm swelling, and nerve injury were fully explained to the patient, who gave informed consent.  Procedure note:  The patient was placed in the supine position. After induction of general endotracheal anesthesia, the left breast and axilla were prepped and draped using the usual sterile technique with ChloraPrep. Surgical site confirmation was performed.  An elliptical incision was made medial to lateral around the left breast. A superior flap was then formed up to the clavicle and an inferior flap formed to the chest wall. The breast was then removed from the pectoralis major muscle medial to lateral using Bovie electrocautery. A short suture was placed superiorly and a Cassin suture placed laterally for orientation purposes. The breast was sent to pathology further examination. Next, a level II axillary dissection was performed. Care was taken to avoid the Coomer thoracic nerve, the thoracfodorsally artery, vein, and nerve, and the intercostal brachial nerve. Several palpable lymph nodes were noted. This was sent to pathology further examination. A bleeding was controlled using clips. Arista was placed into the bed after the wound was irrigated with normal saline. #10 flat Jackson-Pratt drain was placed along the flap and left axilla and brought out through a separate stab wound inferior to the incision line. It was secured at the skin level using a 3-0 nylon suture. The  subcutaneous layer was reapproximated using 2-0 Vicryl interrupted sutures. The skin was closed using staples. Betadine ointment and dry sterile dressings were applied.  All tape and needle counts were correct at the end of the procedure. The patient was extubated in the operating room and transferred to PACU in stable condition.  Complications:  None  EBL:  50 mL  Specimen:  Left breast, left axillary contents  Drains: Jackson-Pratt drain to left axilla and flap

## 2016-12-06 NOTE — Care Management Note (Signed)
Case Management Note  Patient Details  Name: Michelle Henry MRN: [id number] Date of Birth: 25-Feb-1946  Subjective/Objective:  Adm for mastectomy. From home, ind PTA.               Action/Plan: Plans to return home at time of discharge. Anticipate no CM needs.    Expected Discharge Date:       12/07/2016           Expected Discharge Plan:  Home/Self Care  In-House Referral:     Discharge planning Services  CM Consult  Post Acute Care Choice:    Choice offered to:  NA  DME Arranged:    DME Agency:     HH Arranged:    HH Agency:     Status of Service:  In process, will continue to follow  If discussed at [facility] Length of Stay Meetings, dates discussed:    Additional Comments:  Jelisha Weed, Chauncey Reading, RN 12/06/2016, 1:31 PM

## 2016-12-06 NOTE — Addendum Note (Signed)
Addendum  created 12/06/16 1408 by Vista Deck, CRNA   Sign clinical note

## 2016-12-06 NOTE — Anesthesia Preprocedure Evaluation (Signed)
Anesthesia Evaluation  Patient identified by MRN, date of birth, ID band Patient awake    Reviewed: Allergy & Precautions, NPO status , Patient's Chart, lab work & pertinent test results  Airway Mallampati: II  TM Distance: >3 FB Neck ROM: Full    Dental  (+) Edentulous Upper, Edentulous Lower   Pulmonary shortness of breath and with exertion, COPD,  COPD inhaler, Current Smoker,    breath sounds clear to auscultation       Cardiovascular hypertension, Pt. on medications + DOE   Rhythm:Regular Rate:Normal     Neuro/Psych    GI/Hepatic GERD  Medicated,  Endo/Other  diabetes, Type 2, Oral Hypoglycemic Agents  Renal/GU      Musculoskeletal   Abdominal   Peds  Hematology   Anesthesia Other Findings   Reproductive/Obstetrics                             Anesthesia Physical Anesthesia Plan  ASA: III  Anesthesia Plan: General   Post-op Pain Management:    Induction: Intravenous, Rapid sequence and Cricoid pressure planned  Airway Management Planned: Oral ETT  Additional Equipment:   Intra-op Plan:   Post-operative Plan: Extubation in OR  Informed Consent: I have reviewed the patients History and Physical, chart, labs and discussed the procedure including the risks, benefits and alternatives for the proposed anesthesia with the patient or authorized representative who has indicated his/her understanding and acceptance.     Plan Discussed with:   Anesthesia Plan Comments:         Anesthesia Quick Evaluation

## 2016-12-07 DIAGNOSIS — Z853 Personal history of malignant neoplasm of breast: Secondary | ICD-10-CM | POA: Diagnosis not present

## 2016-12-07 LAB — BASIC METABOLIC PANEL
Anion gap: 6 (ref 5–15)
BUN: 11 mg/dL (ref 6–20)
CO2: 28 mmol/L (ref 22–32)
Calcium: 8.5 mg/dL — ABNORMAL LOW (ref 8.9–10.3)
Chloride: 105 mmol/L (ref 101–111)
Creatinine, Ser: 0.66 mg/dL (ref 0.44–1.00)
GFR calc Af Amer: >60 mL/min (ref >60)
GFR calc non Af Amer: >60 mL/min (ref >60)
Glucose, Bld: 105 mg/dL — ABNORMAL HIGH (ref 65–99)
Potassium: 3.5 mmol/L (ref 3.5–5.1)
Sodium: 139 mmol/L (ref 135–145)

## 2016-12-07 LAB — CBC
HCT: 29.1 % — ABNORMAL LOW (ref 36.0–46.0)
Hemoglobin: 9.7 g/dL — ABNORMAL LOW (ref 12.0–15.0)
MCH: 29.7 pg (ref 26.0–34.0)
MCHC: 33.3 g/dL (ref 30.0–36.0)
MCV: 89 fL (ref 78.0–100.0)
Platelets: 221 10*3/uL (ref 150–400)
RBC: 3.27 MIL/uL — ABNORMAL LOW (ref 3.87–5.11)
RDW: 13.6 % (ref 11.5–15.5)
WBC: 8.1 10*3/uL (ref 4.0–10.5)

## 2016-12-07 LAB — HEMOGLOBIN A1C
Hgb A1c MFr Bld: 6.1 % — ABNORMAL HIGH (ref 4.8–5.6)
Mean Plasma Glucose: 128 mg/dL

## 2016-12-07 LAB — GLUCOSE, CAPILLARY: Glucose-Capillary: 103 mg/dL — ABNORMAL HIGH (ref 65–99)

## 2016-12-07 LAB — PHOSPHORUS: Phosphorus: 2.3 mg/dL — ABNORMAL LOW (ref 2.5–4.6)

## 2016-12-07 LAB — MAGNESIUM: Magnesium: 1.6 mg/dL — ABNORMAL LOW (ref 1.7–2.4)

## 2016-12-07 MED ORDER — HYDROCODONE-ACETAMINOPHEN 5-325 MG PO TABS: 1.0000 | ORAL_TABLET | ORAL | 0 refills | Status: DC | PRN

## 2016-12-07 NOTE — Progress Notes (Signed)
Paged Dr. Arnoldo Morale about lack of [facility] for patient.  I was able to explain the JP drain and patient was fully competent to empty and care for drain herself. Patient has been discharged and knows to call Dr. Adline Mango office if she has any issues with the drain. She will follow up in 5 days on May 17th in Dr. Arnoldo Morale office.

## 2016-12-07 NOTE — Progress Notes (Signed)
This Cm is working remotely and has exhausted the following home health agencies to follow with [facility] for JP drain management: [facility], [facility], [facility], [facility], [facility], [facility], and [facility]. CM has called RN, Lauren to please contact MD to notify lack of Home Health support. Lauren states she has taught pt about JP drain.  This CM will place a handoff for weekday CM to explore in case weekday CM is aware of a home health agency who may accept referral.

## 2016-12-07 NOTE — Discharge Summary (Signed)
Physician Discharge Summary  Patient ID: Michelle Henry MRN: [id number] DOB/AGE: 1946/07/13 71 y.o.  Admit date: 12/06/2016 Discharge date: 12/07/2016  Admission Diagnoses:Left breast cancer, COPD, history of right breast cancer  Discharge Diagnoses: Same Active Problems:   Breast cancer, left breast [facility])   Discharged Condition: good  Hospital Course: Patient is a 71 year old black female with a history of a right breast cancer presents with a new left breast cancer. She underwent left modified radical mastectomy on 12/06/2016. She tolerated the procedure well. Her postoperative course has been unremarkable. Her diet was advanced without difficulty. The patient is being discharged home on postoperative day 1 in good and improving condition. Home health has been consulted for drain care. She does have COPD which seems to be stable.  Treatments: surgery: Left modified radical mastectomy on 12/06/2016  Discharge Exam: Blood pressure (!) 125/41, pulse 68, temperature 98.7 F (37.1 C), temperature source Oral, resp. rate 15, height 5' (1.524 m), weight 141 lb (64 kg), SpO2 92 %. General appearance: alert, cooperative and no distress Resp: clear to auscultation bilaterally Breasts: Left mastectomy dressing dry and intact. JP drainage sanguinous in nature, with low output. Cardio: regular rate and rhythm, S1, S2 normal, no murmur, click, rub or gallop  Disposition: 01-Home or Self Care  Discharge Instructions    Diet - low sodium heart healthy    Complete by:  As directed    Increase activity slowly    Complete by:  As directed      Allergies as of 12/07/2016   No Known Allergies     Medication List    TAKE these medications   albuterol 108 (90 Base) MCG/ACT inhaler Commonly known as:  PROVENTIL HFA;VENTOLIN HFA Inhale 1-2 puffs into the lungs every 6 (six) hours as needed for wheezing or shortness of breath.   aspirin EC 81 MG tablet Take 81 mg by mouth daily after  breakfast.   beclomethasone 40 MCG/ACT inhaler Commonly known as:  QVAR Inhale 1 puff into the lungs daily.   benazepril 20 MG tablet Commonly known as:  LOTENSIN Take 20 mg by mouth daily after breakfast.   diphenhydrAMINE 25 MG tablet Commonly known as:  BENADRYL Take 25 mg by mouth every 6 (six) hours as needed (for sinus/allergies.).   docusate sodium 100 MG capsule Commonly known as:  COLACE Take 200 mg by mouth daily after breakfast.   HYDROcodone-acetaminophen 5-325 MG tablet Commonly known as:  NORCO/VICODIN Take 1-2 tablets by mouth every 4 (four) hours as needed for moderate pain.   hydrOXYzine 25 MG tablet Commonly known as:  ATARAX/VISTARIL Take 25 mg by mouth every 8 (eight) hours as needed for itching.   lovastatin 20 MG tablet Commonly known as:  MEVACOR Take 20 mg by mouth at bedtime.   metFORMIN 500 MG tablet Commonly known as:  GLUCOPHAGE Take 500 mg by mouth daily after breakfast.   naproxen 500 MG tablet Commonly known as:  NAPROSYN Take 500 mg by mouth every 12 (twelve) hours as needed (for pain.).   omeprazole 20 MG capsule Commonly known as:  PRILOSEC Take 20 mg by mouth daily before breakfast.   VITRON-C PO Take 1 tablet by mouth daily after breakfast.      Follow-up Information    [name], MD. Schedule an appointment as soon as possible for a visit on 12/12/2016.   Specialty:  General Surgery Contact information: [id number] [phone]  Signed: [name] 12/07/2016, 10:42 AM

## 2016-12-07 NOTE — Discharge Instructions (Signed)
Total or Modified Radical Mastectomy, Care After Refer to this sheet in the next few weeks. These instructions provide you with information about caring for yourself after your procedure. Your health care provider may also give you more specific instructions. Your treatment has been planned according to current medical practices, but problems sometimes occur. Call your health care provider if you have any problems or questions after your procedure. What can I expect after the procedure? After your procedure, it is common to have:  Pain.  Numbness.  Stiffness in your arm or shoulder.  Feelings of stress, sadness, or depression. If the lymph nodes under your arm were removed, you may have arm swelling, weakness, or numbness on the same side of your body as your surgery. Follow these instructions at home: Incision care   There are many different ways to close and cover an incision, including stitches, skin glue, and adhesive strips. Follow your health care provider's instructions about:  Incision care.  Bandage (dressing) changes and removal.  Incision closure removal.  Check your incision area every day for signs of infection. Watch for:  Redness, swelling, or pain.  Fluid, blood, or pus.  If you were sent home with a surgical drain in place, follow your health care provider's instructions for emptying it. Bathing   Do not take baths, swim, or use a hot tub until your health care provider approves.  Take sponge baths until your health care provider says that you can start showering or bathing. Activity   Return to your normal activities as directed by your health care provider.  Avoid strenuous exercise.  Be careful to avoid any activities that could cause an injury to your arm on the side of your surgery.  Do not lift anything that is heavier than 10 lb (4.5 kg). Avoid lifting with the arm that is on the side of your surgery.  Do not carry heavy objects on your  shoulder.  After your drain is removed, you should perform exercises to keep your arm from getting stiff and swollen. Talk with your health care provider about which exercises are safe for you. General instructions   Take medicines only as directed by your health care provider.  You may eat what you usually do.  Keep your arm elevated when at rest.  Do not wear tight jewelry on your arm, wrist, or fingers on the side of your surgery.  Get checked for extra fluid around your lymph nodes (lymphedema) as often as told by your health care provider.  If you had a modified radical mastectomy, always let your health care providers know that lymph nodes under your arm were removed. This is important information to share before you are involved in certain procedures, such as giving blood or having your blood pressure taken. Contact a health care provider if:  You have a fever.  Your pain medicine is not working.  Your arm swelling, weakness, or numbness has not improved after a few weeks.  You have new swelling in your breast or arm.  You have redness, swelling, or pain in your incision area.  You have fluid, blood, or pus coming from your incision. Get help right away if:  You have very bad pain in your breast or arm.  You have chest pain.  You have difficulty breathing. This information is not intended to replace advice given to you by your health care provider. Make sure you discuss any questions you have with your health care provider. Document Released: 03/07/2004  Document Revised: 03/21/2016 Document Reviewed: 03/30/2014 Elsevier Interactive Patient Education  2017 [place] Surgical drains are used to remove extra fluid that normally builds up in a surgical wound after surgery. A surgical drain helps to heal a surgical wound. Different kinds of surgical drains include:  Active drains. These drains use suction to pull drainage away from the  surgical wound. Drainage flows through a tube to a container outside of the body. It is important to keep the bulb or the drainage container flat (compressed) at all times, except while you empty it. Flattening the bulb or container creates suction. The two most common types of active drains are bulb drains and Hemovac drains.  Passive drains. These drains allow fluid to drain naturally, by gravity. Drainage flows through a tube to a bandage (dressing) or a container outside of the body. Passive drains do not need to be emptied. The most common type of passive drain is the Penrose drain. A drain is placed during surgery. Immediately after surgery, drainage is usually bright red and a little thicker than water. The drainage may gradually turn yellow or pink and become thinner. It is likely that your health care provider will remove the drain when the drainage stops or when the amount decreases to 1-2 Tbsp (15-30 mL) during a 24-hour period. How to care for your surgical drain  Keep the skin around the drain dry and covered with a dressing at all times.  Check your drain area every day for signs of infection. Check for:  More redness, swelling, or pain.  Pus or a bad smell.  Cloudy drainage. Follow instructions from your health care provider about how to take care of your drain and how to change your dressing. Change your dressing at least one time every day. Change it more often if needed to keep the dressing dry. Make sure you: 1. Gather your supplies, including:  Tape.  Germ-free cleaning solution (sterile saline).  Split gauze drain sponge: 4 x 4 inches (10 x 10 cm).  Gauze square: 4 x 4 inches (10 x 10 cm). 2. Wash your hands with soap and water before you change your dressing. If soap and water are not available, use hand sanitizer. 3. Remove the old dressing. Avoid using scissors to do that. 4. Use sterile saline to clean your skin around the drain. 5. Place the tube through the slit  in a drain sponge. Place the drain sponge so that it covers your wound. 6. Place the gauze square or another drain sponge on top of the drain sponge that is on the wound. Make sure the tube is between those layers. 7. Tape the dressing to your skin. 8. If you have an active bulb or Hemovac drain, tape the drainage tube to your skin 1-2 inches (2.5-5 cm) below the place where the tube enters your body. Taping keeps the tube from pulling on any stitches (sutures) that you have. 9. Wash your hands with soap and water. 10. Write down the color of your drainage and how often you change your dressing. How to empty your active bulb or Hemovac drain 1. Make sure that you have a measuring cup that you can empty your drainage into. 2. Wash your hands with soap and water. If soap and water are not available, use hand sanitizer. 3. Gently move your fingers down the tube while squeezing very lightly. This is called stripping the tube. This clears any drainage, clots, or tissue from the tube.  Do not pull on the tube.  You may need to strip the tube several times every day to keep the tube clear. 4. Open the bulb cap or the drain plug. Do not touch the inside of the cap or the bottom of the plug. 5. Empty all of the drainage into the measuring cup. 6. Compress the bulb or the container and replace the cap or the plug. To compress the bulb or the container, squeeze it firmly in the middle while you close the cap or plug the container. 7. Write down the amount of drainage that you have in each 24-hour period. If you have less than 2 Tbsp (30 mL) of drainage during 24 hours, contact your health care provider. 8. Flush the drainage down the toilet. 9. Wash your hands with soap and water. Contact a health care provider if:  You have more redness, swelling, or pain around your drain area.  The amount of drainage that you have is increasing instead of decreasing.  You have pus or a bad smell coming from your  drain area.  You have a fever.  You have drainage that is cloudy.  There is a sudden stop or a sudden decrease in the amount of drainage that you have.  Your tube falls out.  Your active draindoes not stay compressedafter you empty it. This information is not intended to replace advice given to you by your health care provider. Make sure you discuss any questions you have with your health care provider. Document Released: 07/12/2000 Document Revised: 12/21/2015 Document Reviewed: 02/01/2015 Elsevier Interactive Patient Education  2017 [employer].

## 2016-12-10 ENCOUNTER — Encounter (HOSPITAL_COMMUNITY): Payer: Self-pay | Admitting: General Surgery

## 2016-12-12 ENCOUNTER — Ambulatory Visit (INDEPENDENT_AMBULATORY_CARE_PROVIDER_SITE_OTHER): Payer: Self-pay | Admitting: General Surgery

## 2016-12-12 ENCOUNTER — Encounter: Payer: Self-pay | Admitting: General Surgery

## 2016-12-12 VITALS — BP 115/73 | HR 92 | Temp 97.8°F | Resp 20 | Ht 60.0 in | Wt 131.0 lb

## 2016-12-12 DIAGNOSIS — Z09 Encounter for follow-up examination after completed treatment for conditions other than malignant neoplasm: Secondary | ICD-10-CM

## 2016-12-12 MED ORDER — HYDROCODONE-ACETAMINOPHEN 5-325 MG PO TABS: 1.0000 | ORAL_TABLET | ORAL | 0 refills | Status: DC | PRN

## 2016-12-12 NOTE — Progress Notes (Signed)
Subjective:     Michelle Henry  Status post left modified radical mastectomy. Doing well. JP drainage minimal. Moderate incisional pain. It is controlled with pain medication, though she is running out. Objective:    BP 115/73   Pulse 92   Temp 97.8 F (36.6 C)   Resp 20   Ht 5' (1.524 m)   Wt 131 lb (59.4 kg)   BMI 25.58 kg/m   Left breast incision healing well. One half of staples removed.   JP drain removed. No hematoma present.   final pathology reveals a T1, N0, M0 invasive ductal carcinoma.  Assessment:    Doing well postoperatively.    Plan:   Gradually increase activity with left arm as able. Norco prescribed for pain. Follow-up in one week.

## 2016-12-19 ENCOUNTER — Ambulatory Visit (INDEPENDENT_AMBULATORY_CARE_PROVIDER_SITE_OTHER): Payer: Self-pay | Admitting: General Surgery

## 2016-12-19 ENCOUNTER — Encounter: Payer: Self-pay | Admitting: General Surgery

## 2016-12-19 VITALS — BP 143/57 | HR 73 | Temp 98.2°F | Resp 18 | Ht 60.0 in | Wt 137.0 lb

## 2016-12-19 DIAGNOSIS — Z09 Encounter for follow-up examination after completed treatment for conditions other than malignant neoplasm: Secondary | ICD-10-CM

## 2016-12-19 NOTE — Progress Notes (Signed)
Subjective:     Michelle Henry   status post left modified radical mastectomy.  Doing well.  Mild incisional and left shoulder pain with movement. Objective:    BP (!) 143/57   Pulse 73   Temp 98.2 F (36.8 C)   Resp 18   Ht 5' (1.524 m)   Wt 137 lb (62.1 kg)   BMI 26.76 kg/m   General:  alert, cooperative and no distress    Left breast incision well healed.  Remaining staples removed.  No hematoma present.  There is able to lift her left arm up, though she states there is some soreness present.     Assessment:    Doing well postoperatively.    Plan:    Continue left arm range of motion exercises.  Will refer to oncology for further evaluation and treatment.  Follow up here as needed.

## 2017-01-01 ENCOUNTER — Encounter (HOSPITAL_COMMUNITY): Payer: Medicare Other | Attending: Oncology | Admitting: Oncology

## 2017-01-01 ENCOUNTER — Encounter (HOSPITAL_COMMUNITY): Payer: Self-pay | Admitting: Lab

## 2017-01-01 ENCOUNTER — Encounter (HOSPITAL_COMMUNITY): Payer: Self-pay

## 2017-01-01 ENCOUNTER — Encounter (HOSPITAL_COMMUNITY): Payer: Self-pay | Admitting: Emergency Medicine

## 2017-01-01 VITALS — BP 135/52 | HR 78 | Temp 98.2°F | Resp 16 | Ht 60.0 in | Wt 132.4 lb

## 2017-01-01 DIAGNOSIS — N189 Chronic kidney disease, unspecified: Secondary | ICD-10-CM | POA: Diagnosis not present

## 2017-01-01 DIAGNOSIS — C50912 Malignant neoplasm of unspecified site of left female breast: Secondary | ICD-10-CM | POA: Diagnosis not present

## 2017-01-01 DIAGNOSIS — Z170 Estrogen receptor positive status [ER+]: Secondary | ICD-10-CM | POA: Diagnosis not present

## 2017-01-01 MED ORDER — HYDROCODONE-ACETAMINOPHEN 5-325 MG PO TABS: 1.0000 | ORAL_TABLET | ORAL | 0 refills | Status: DC | PRN

## 2017-01-01 NOTE — Progress Notes (Unsigned)
Referral sent to [facility].  Records faxed on 6/6

## 2017-01-01 NOTE — Progress Notes (Signed)
oncotype sent.  Fax confirmed.  Requested pathology.  Fax confirmed.    Physical therapy referral placed.  Introduced myself to the pt.

## 2017-01-01 NOTE — Progress Notes (Signed)
[facility]  CONSULT NOTE  Patient Care Team: Baucom, Alfonso Ellis as PCP - General (Physician Assistant)  CHIEF COMPLAINTS/PURPOSE OF CONSULTATION:  Left breast IDC  HISTORY OF PRESENTING ILLNESS:  Michelle Henry 71 y.o.postmenopausal female is here because of referral by [facility] for left breast IDC. 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 She is ER 100% positive, PR 80% positive.   Patient has a past medical history of early stage right breast cancer s/p mastectomy at [place], chronic kidney disease, COPD, Diabetes mellitus, dysphagia, GERD, hypertension, and hypercholesterolemia.  In 2006 she had a mastectomy for the cancer on her right breast. She did not receive any radiation or chemotherapy at that time. She wants to have mastectomy done for her current left breast carcinoma.    Breast cancer, left ([place])   07/30/2016 Mammogram    Left diagnostic mammogram: Mammographic distortion in the lateral inferior left breast thought to correlate with a region of distortion and a small associated mass seen on ultrasound.      07/30/2016 Imaging    Left breast US: Targeted ultrasound is performed, showing a mass and distortion in the left breast at 4 o'clock, 4 cm from the nipple, thought to correlate with the mammographic finding measuring 7 x 4 x 6 mm. The mass is irregular and taller than wide. No left axillary adenopathy.      08/13/2016 Pathology Results    Left breast core needle biopsy: Breast, left, needle core biopsy, 4:00 - INVASIVE DUCTAL CARCINOMA. - DUCTAL CARCINOMA IN SITU. - SEE COMMENT. Microscopic Comment The invasive carcinoma is grade 1 and is highlighted by a lack of myoepithelial cells as demonstrated by p63, calponin, and smooth muscle myosin stains.  Hormone Profile: ER positive, PR positive, HER2 negative      08/13/2016 Initial Diagnosis    Breast  cancer, left ([place])      09/03/2016 Imaging    MRI breast: IMPRESSION: 1. Architectural distortion involving the lower outer quadrant of the left breast. The tissue marker clip placed at the time of ultrasound biopsy is within the area of distortion. 2. 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 There is no mammographic correlate. 3. No pathologic lymphadenopathy.      12/06/2016 Surgery    Left modified radical mastectomy by Dr. Arnoldo Morale. Her surgical path demonstrated invasive ductal carcinoma with extracellular mucin, grade 1, spanning 2 cm. DCIS grade 1. All margins negative. 0/8 lymph nodes resected were positive for malignancy. Final pathology staging pT1cpN0pMx (Stage IB).       Patient presents today for continued follow-up. Since her last visit she has seen Dr. Arnoldo Morale and has undergone left modified radical mastectomy on 12/06/16. Her surgical path demonstrated invasive ductal carcinoma with extracellular mucin, grade 1, spanning 2 cm. DCIS grade 1. 0/8 lymph nodes resected were positive for malignancy. Final pathology staging pT1cpN0pMx (Stage IB). Patient states that she is doing well post surgery, however she does still have pain at the surgical site. Requested a refill of her pain meds. She also complains of knot at the surgical site. She denies any chest pain, shortness breath, fatigue, nausea, vomiting, diarrhea, focal weakness.     MEDICAL HISTORY:  Past Medical History:  Diagnosis Date  . Blood transfusion   . Cancer ([place])   . Chronic kidney  disease   . COPD (chronic obstructive pulmonary disease) ([place])   . Diabetes mellitus   . Dysphagia   . GERD (gastroesophageal reflux disease)   . High cholesterol   . Hypertension   . Shortness of breath     SURGICAL HISTORY: Past Surgical History:  Procedure Laterality Date  . BREAST SURGERY    .  ESOPHAGOGASTRODUODENOSCOPY (EGD) WITH ESOPHAGEAL DILATION  11/2012   [facility]  . MASTECTOMY  right side  . MASTECTOMY MODIFIED RADICAL Left 12/06/2016   Procedure: MASTECTOMY MODIFIED RADICAL;  Surgeon: [name], MD;  Location: [facility];  Service: General;  Laterality: Left;  . TONSILLECTOMY      SOCIAL HISTORY: Social History   Social History  . Marital status: Single    Spouse name: N/A  . Number of children: N/A  . Years of education: N/A   Occupational History  . Not on file.   Social History Main Topics  . Smoking status: Current Every Day Smoker    Packs/day: 0.75    Years: 53.00    Types: Cigarettes  . Smokeless tobacco: Never Used  . Alcohol use No  . Drug use: No  . Sexual activity: Yes   Other Topics Concern  . Not on file   Social History Narrative  . No narrative on file    FAMILY HISTORY: No family history on file. No family history of breast or ovarian cancer.   ALLERGIES:  has No Known Allergies.  MEDICATIONS:  Current Outpatient Prescriptions  Medication Sig Dispense Refill  . albuterol (PROVENTIL HFA;VENTOLIN HFA) 108 (90 BASE) MCG/ACT inhaler Inhale 1-2 puffs into the lungs every 6 (six) hours as needed for wheezing or shortness of breath.    Marland Kitchen aspirin EC 81 MG tablet Take 81 mg by mouth daily after breakfast.     . beclomethasone (QVAR) 40 MCG/ACT inhaler Inhale 1 puff into the lungs daily.     . benazepril (LOTENSIN) 20 MG tablet Take 20 mg by mouth daily after breakfast.     . diphenhydrAMINE (BENADRYL) 25 MG tablet Take 25 mg by mouth every 6 (six) hours as needed (for sinus/allergies.).    Marland Kitchen docusate sodium (COLACE) 100 MG capsule Take 200 mg by mouth daily after breakfast.    . HYDROcodone-acetaminophen (NORCO/VICODIN) 5-325 MG tablet Take 1-2 tablets by mouth every 4 (four) hours as needed for moderate pain. 40 tablet 0  . hydrOXYzine (ATARAX/VISTARIL) 25 MG tablet Take 25 mg by mouth every 8 (eight) hours as needed for itching.    .  Iron-Vitamin C (VITRON-C PO) Take 1 tablet by mouth daily after breakfast.    . lovastatin (MEVACOR) 20 MG tablet Take 20 mg by mouth at bedtime.    . metFORMIN (GLUCOPHAGE) 500 MG tablet Take 500 mg by mouth daily after breakfast.     . naproxen (NAPROSYN) 500 MG tablet Take 500 mg by mouth every 12 (twelve) hours as needed (for pain.).     Marland Kitchen omeprazole (PRILOSEC) 20 MG capsule Take 20 mg by mouth daily before breakfast.      No current facility-administered medications for this visit.    Facility-Administered Medications Ordered in Other Visits  Medication Dose Route Frequency Provider Last Rate Last Dose  . gadobenate dimeglumine (MULTIHANCE) injection 14 mL  14 mL Intravenous Once PRN Baucom, Lennon Alstrom, PA-C        Review of Systems  Constitutional: Negative for weight loss.  HENT: Negative.   Eyes: Negative.   Respiratory: Negative.  Negative  for shortness of breath.   Cardiovascular: Negative.  Negative for chest pain.  Gastrointestinal: Negative.  Negative for abdominal pain.  Genitourinary: Negative.   Musculoskeletal: Negative.   Skin: Negative.   Neurological: Negative.   Endo/Heme/Allergies: Negative.   Psychiatric/Behavioral: Negative.   All other systems reviewed and are negative.  14 point ROS was done and is otherwise as detailed above or in HPI   PHYSICAL EXAMINATION: ECOG PERFORMANCE STATUS: 0 - Asymptomatic  Vitals:   01/01/17 0902  BP: (!) 135/52  Pulse: 78  Resp: 16  Temp: 98.2 F (36.8 C)   Filed Weights   01/01/17 0902  Weight: 132 lb 6.4 oz (60.1 kg)     Physical Exam  Constitutional: She is oriented to person, place, and time and well-developed, well-nourished, and in no distress.  HENT:  Head: Normocephalic and atraumatic.  Eyes: Conjunctivae and EOM are normal. Pupils are equal, round, and reactive to light.  Neck: Normal range of motion. Neck supple.  Cardiovascular: Normal rate, regular rhythm and normal heart sounds.   Pulmonary/Chest:  Effort normal and breath sounds normal.    Abdominal: Soft. Bowel sounds are normal.  Musculoskeletal: Normal range of motion.  Neurological: She is alert and oriented to person, place, and time. Gait normal.  Skin: Skin is warm and dry.  Nursing note and vitals reviewed.     LABORATORY DATA:  I have reviewed the data as listed Lab Results  Component Value Date   WBC 8.1 12/07/2016   HGB 9.7 (L) 12/07/2016   HCT 29.1 (L) 12/07/2016   MCV 89.0 12/07/2016   PLT 221 12/07/2016   CMP     Component Value Date/Time   NA 139 12/07/2016 0547   K 3.5 12/07/2016 0547   CL 105 12/07/2016 0547   CO2 28 12/07/2016 0547   GLUCOSE 105 (H) 12/07/2016 0547   BUN 11 12/07/2016 0547   CREATININE 0.66 12/07/2016 0547   CALCIUM 8.5 (L) 12/07/2016 0547   PROT 6.8 12/02/2016 1437   ALBUMIN 3.8 12/02/2016 1437   AST 21 12/02/2016 1437   ALT 13 (L) 12/02/2016 1437   ALKPHOS 80 12/02/2016 1437   BILITOT 0.4 12/02/2016 1437   GFRNONAA >60 12/07/2016 0547   GFRAA >60 12/07/2016 0547     RADIOGRAPHIC STUDIES: I have personally reviewed the radiological images as listed and agreed with the findings in the report. No results found.   BILATERAL BREAST MRI WITH AND WITHOUT CONTRAST 09/03/2016  IMPRESSION: 1. Architectural distortion involving the lower outer quadrant of the left breast. The tissue marker clip placed at the time of ultrasound biopsy is within the area of distortion. 2. 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 There is no mammographic correlate. 3. No pathologic lymphadenopathy.   MRI GUIDED CORE NEEDLE BIOPSY OF THE LEFT BREAST 10/28/2016  IMPRESSION: MRI guided biopsy of non mass enhancement in the left breast. No apparent complications.  ADDENDUM: Pathology revealed GRADE I INVASIVE DUCTAL CARCINOMA, DUCTAL CARCINOMA IN SITU of the Left breast, lower outer.  This was found to be concordant by Dr. Dorise Bullion.   PATHOLOGY:       atientSTELLAROSE, CERNY Collected: 12/06/2016 Client: [facility] Accession: [id number] Received: 12/06/2016 [name] DOB: [birth year] Age: 89 Gender: F Reported: 12/09/2016 [address] Patient Ph: [phone] MRN #: [id number] Linna Hoff, [place] [address] Visit #: [id number] Chart #: Phone: [phone] Fax:  CC: REPORT OF SURGICAL PATHOLOGY FINAL DIAGNOSIS Diagnosis 1. Breast, modified radical mastectomy , left INVASIVE DUCTAL CARCINOMA WITH EXTRA CELLULAR MUCIN, GRADE 1, SPANNING 2.0 CM DUCTAL CARCINOMA IN SITU, GRADE 1 ALL MARGINS OF RESECTION ARE NEGATIVE FOR CARCINOMA 2. Lymph nodes, regional resection, left axillary EIGHT BENIGN LYMPH NODES (0/8) Microscopic Comment 1. BREAST, INVASIVE TUMOR Procedure: Modified radical mastectomy Laterality: Left Tumor Size: 2.0 cm Histologic Type: ductal carcinoma Grade: Tubular Differentiation: 2 Nuclear Pleomorphism: 1 Mitotic Count: 1 Ductal Carcinoma in Situ (DCIS): Present Extent of Tumor: Skin: Negative Nipple: Negative Skeletal muscle: Negative Margins: Invasive carcinoma, distance from closest margin: 0.9 cm from inferior margin DCIS, distance from closest margin: 0.9 cm from inferior margin Regional Lymph Nodes: Number of Lymph Nodes Examined: 8 Number of Sentinel Lymph Nodes Examined: 0 Lymph Nodes with Macrometastases: 0 Lymph Nodes with Micrometastases: 0 Lymph Nodes with Isolated Tumor Cells: 0 Breast Prognostic Profile: 1 of 3 FINAL for Gleaves, Nixon M ([id number]) Microscopic Comment(continued) Estrogen Receptor: 100% Progesterone Receptor: 80% Her2: Negative Ki-67: 5% Best tumor block for sendout testing: 1B Pathologic Stage Classification (pTNM, AJCC 8th Edition): Primary Tumor (pT): pT1c Regional Lymph Nodes (pN): pN0 Distant Metastases (pM): pMx Casimer Lanius MD Pathologist, [employer]  ASSESSMENT & PLAN:    Stage IB (pT1c pN0) IDC ER/PR positive, HER2 negative with DCIS of left breast s/p left modified radical mastectomy h/o right breast CA s/p mastectomy  PLAN: Reviewed the path report from her left mastectomy in detail with the patient today. Since her tumor is ER/PR positive, HER2 negative and >0.5 cm, I will send out for Oncotype Dx to see if she will benefit from adjuvant chemotherapy. Since she's has a mastectomy with axillary staging and her axillary nodes are negative and tumor is <5 cm and margins are >61m, I do not believe she will need adjuvant RT but [facility] opinion. If her recurrence score is less than 31 on her oncotype Dx, then I will start her on endocrine therapy alone.  RTC in 4 weeks for follow up.   All questions were answered. The patient knows to call the clinic with any problems, questions or concerns.     LTwana First MD  01/01/2017 8:51 AM

## 2017-01-07 DIAGNOSIS — C50512 Malignant neoplasm of lower-outer quadrant of left female breast: Secondary | ICD-10-CM | POA: Insufficient documentation

## 2017-01-07 DIAGNOSIS — Z170 Estrogen receptor positive status [ER+]: Secondary | ICD-10-CM | POA: Insufficient documentation

## 2017-01-09 ENCOUNTER — Telehealth (HOSPITAL_COMMUNITY): Payer: Self-pay | Admitting: Oncology

## 2017-01-09 ENCOUNTER — Telehealth (HOSPITAL_COMMUNITY): Payer: Self-pay | Admitting: *Deleted

## 2017-01-09 ENCOUNTER — Encounter (HOSPITAL_BASED_OUTPATIENT_CLINIC_OR_DEPARTMENT_OTHER): Payer: Medicare Other | Admitting: Oncology

## 2017-01-09 DIAGNOSIS — T8189XD Other complications of procedures, not elsewhere classified, subsequent encounter: Secondary | ICD-10-CM

## 2017-01-09 DIAGNOSIS — C50912 Malignant neoplasm of unspecified site of left female breast: Secondary | ICD-10-CM

## 2017-01-09 DIAGNOSIS — Z170 Estrogen receptor positive status [ER+]: Secondary | ICD-10-CM

## 2017-01-09 DIAGNOSIS — C50512 Malignant neoplasm of lower-outer quadrant of left female breast: Secondary | ICD-10-CM

## 2017-01-09 NOTE — Telephone Encounter (Signed)
Dr. Lyndon Code called reporting that there is not enough tissue for OncotypeDx.  Robynn Pane, PA-C 01/09/2017 4:13 PM

## 2017-01-09 NOTE — Progress Notes (Signed)
Patient seen today as a work in following surgery for some patient reported complication of healing.  We see her for a grade 1 invasive ductal carcinoma of the left breast, ER +100%, PR +80%.  She is status post left modified radical mastectomy on 12/06/2016 by Dr. Arnoldo Morale.  The patient has called Dr. Arnoldo Morale office regarding her current issue but he is unavailable as he is on vacation.  Therefore, the patient was referred to the medical oncology clinic for evaluation.  On questioning, the patient report, "I saw this flap of skin and I was worried!"  She reports that it is minimally tender.  She denies any discharge.  She denies any significant redness or discharge.  She denies any fevers.  Exam: Gen: NAD.  Pleasant. HEENT: Normocephalic, atraumatic Oropharynx: edentulous Neck: Trachea midline Skin: Warm and dry Neuro: A and O x 3 Chest: Right mastectomy site well healed, free of suspicious changes.  LEFT mastectomy site healing nicely without any suspicious findings, but with a small, 5 mm open area that is healing without erythema or discharge.  Minimally tender to palpation.      Assessment: Healing mastectomy site with an small open area in the lateral 1/3 of incision site.  Plan: Apply Neosporin (or similar) product BID-TID.  Keep area clean and dry.  Follow-up with Gen Surgery and Medical Oncology as directed.  Patient and plan discussed with Dr. Twana First and she is in agreement with the aforementioned.   Robynn Pane, PA-C 01/09/2017 2:53 PM

## 2017-01-09 NOTE — Telephone Encounter (Signed)
Talked with patient. She is concerned about an area of skin close to her incision. She tried to call Dr. Arnoldo Morale but he is on vacation. Reviewed with Tom the PA-C and he said for patient to come in so we can check it. Called patient back, we requested she get here around 2 but she is at work and states she can get here by 3. Notified scheduling to add patient on.

## 2017-01-13 ENCOUNTER — Encounter (HOSPITAL_COMMUNITY): Payer: Self-pay

## 2017-01-13 ENCOUNTER — Ambulatory Visit (HOSPITAL_COMMUNITY): Payer: Medicare Other | Attending: Oncology

## 2017-01-13 DIAGNOSIS — C50912 Malignant neoplasm of unspecified site of left female breast: Secondary | ICD-10-CM | POA: Insufficient documentation

## 2017-01-13 DIAGNOSIS — M25612 Stiffness of left shoulder, not elsewhere classified: Secondary | ICD-10-CM | POA: Diagnosis not present

## 2017-01-13 NOTE — Patient Instructions (Addendum)
Doorway Stretch  Place each hand opposite each other on the doorway. (You can change where you feel the stretch by moving arms higher or lower.) Step through with one foot and bend front knee until a stretch is felt and hold. Step through with the opposite foot on the next rep. Hold for __10-15___ seconds. Repeat _2___times.     Scapular Retraction (Standing)   With arms at sides, pinch shoulder blades together. Repeat _10___ times per set. Do _1___ sets per session. Do _2___ sessions per day.  http://orth.exer.us/944     Wall Flexion  Using a towel, slide your arm up the wall until a stretch is felt in your shoulder . Hold for 10-15 seconds. Repeat 2 times.    Shoulder Abduction Stretch  Stand side ways by a wall with affected up on wall. Gently lean toward wall to feel stretch.  Hold for 10-15 seconds. Complete 2 times.      Therapy Information for After Breast Cancer Surgery/Treatment:  . Lymphedema is a swelling condition that you may be at risk for in your arm if you have lymph nodes removed from the armpit area. After a sentinel node biopsy, the risk is approximately 5-9% and is higher after an axillary node dissection. There is treatment available for this condition and it is not life-threatening. Contact your physician or therapist with concerns.  . We encourage you to attend the FREE one time ABC (After Breast Cancer) class offered by [facility]. You will learn information related to lymphedema risk, preventions and treatment, and additional exercises to regain mobility following surgery. You can call [phone] for more information. This is offered the 1st and 3rd Monday of each month. You only attend one class, 1 time. . While undergoing any medical procedure or treatment, try to avoid blood pressure being taken or needle sticks from occurring on the arm on the side of the cancer. This recommendation begins, after surgery and continues for  the rest of your life. This may help reduce your risk of getting lymphedema (swelling in your arm). . An excellent resource for those seeking information on lymphedema is the National Lymphedema Network's website. It can be accessed at UpgradePros.com.pt. . If you notice swelling in your hand, arm, or breast at any time following surgery (even if it is many year from now), please contact your doctor or therapist to discuss this. Lymphedema can be treated at any time but it is easier for you if it is treated early on. If you feel like your shoulder motion is not returning to normal in a reasonable amount of time, please contact your surgeon or therapist. Ailene Ravel OTR/L, CBIS [phone]; [address], [place], [place] [address]

## 2017-01-13 NOTE — Therapy (Signed)
Michelle Henry, [place], [address] Phone: [phone]   Fax:  [phone]  Occupational Therapy Evaluation  Patient Details  Name: Michelle Henry MRN: [id number] Date of Birth: May 18, 1946 Referring Provider: Dr. Twana First  Encounter Date: 01/13/2017      OT End of Session - 01/13/17 1458    Visit Number 1   Number of Visits 1   Authorization Type [employer]   Authorization Time Period before 10th visit   Authorization - Visit Number 1   Authorization - Number of Visits 10   OT Start Time 1350   OT Stop Time 1430   OT Time Calculation (min) 40 min   Activity Tolerance Patient tolerated treatment well   Behavior During Therapy [facility] for tasks assessed/performed      Past Medical History:  Diagnosis Date  . Blood transfusion   . Cancer ([place])   . Chronic kidney disease   . COPD (chronic obstructive pulmonary disease) ([place])   . Diabetes mellitus   . Dysphagia   . GERD (gastroesophageal reflux disease)   . High cholesterol   . Hypertension   . Shortness of breath     Past Surgical History:  Procedure Laterality Date  . BREAST SURGERY    . ESOPHAGOGASTRODUODENOSCOPY (EGD) WITH ESOPHAGEAL DILATION  11/2012   [facility]  . MASTECTOMY  right side  . MASTECTOMY MODIFIED RADICAL Left 12/06/2016   Procedure: MASTECTOMY MODIFIED RADICAL;  Surgeon: [name], MD;  Location: [facility];  Service: General;  Laterality: Left;  . TONSILLECTOMY      There were no vitals filed for this visit.      Subjective Assessment - 01/13/17 1356    Subjective  S: For a while I was unable to lift my very high and I just started working on it and it has gotten a lot better.    Pertinent History Patient is a 71 y/o female S/P grade 1 breast invasive ductal carcinoma who underwent a left mastectomy on 12/06/16. Pt underwent a a right mastecomy in 2006. Dr. Talbert Cage has referred patient to occupational therapy for evaluation and treatment.    Patient Stated Goals None stated.   Currently in Pain? Yes   Pain Score 3    Pain Location Rib cage  Anterior rib cage   Pain Orientation Anterior;Left   Pain Descriptors / Indicators Sore   Pain Type Acute pain   Pain Radiating Towards N/A   Pain Onset In the past 7 days   Pain Frequency Intermittent   Aggravating Factors  Nothing   Pain Relieving Factors pain meds   Effect of Pain on Daily Activities Patient pushes through the pain. No effect on daily activities.   Multiple Pain Sites No           [facility] OT Assessment - 01/13/17 1359      Assessment   Diagnosis Left breast cancer   Referring Provider Dr. Twana First   Onset Date 11/26/16   Prior Therapy None     Precautions   Precautions None;Other (comment)   Precaution Comments Hx for bilateral breast cancer. No BP or needle sticks in BUE.     Restrictions   Weight Bearing Restrictions No     Balance Screen   Has the patient fallen in the past 6 months No     Home  Environment   Family/patient expects to be discharged to: Private residence   Lives With Other (Comment)  [place] -  71 y/o     Prior Function   Level of Independence Independent   Vocation Part time employment   [employer]. Works 4 hours during the week and 6 hours on sunday.     ADL   ADL comments Pt reports some difficulty with pulling her pants up when dressing.     Mobility   Mobility Status Independent     Written Expression   Dominant Hand Right     Vision - History   Baseline Vision No visual deficits     Cognition   Overall Cognitive Status Within Functional Limits for tasks assessed     ROM / Strength   AROM / PROM / Strength AROM;Strength     AROM   Overall AROM  --   Overall AROM Comments Assessed seated. IR/er abducted.   AROM Assessment Site Shoulder   Right/Left Shoulder Left   Left Shoulder Flexion 130 Degrees   Left Shoulder ABduction 137 Degrees   Left Shoulder Internal Rotation 50  Degrees   Left Shoulder External Rotation 60 Degrees     Strength   Overall Strength Within functional limits for tasks performed   Overall Strength Comments Assessed seated. IR/er abducted.   Strength Assessment Site Shoulder   Right/Left Shoulder Left   Left Shoulder Flexion 5/5   Left Shoulder ABduction 5/5   Left Shoulder Internal Rotation 5/5   Left Shoulder External Rotation 5/5          LYMPHEDEMA/ONCOLOGY QUESTIONNAIRE - 01/13/17 1405      Type   Cancer Type Grade 1 breast invasive ductal carcinoma     Surgeries   Mastectomy Date 12/06/16  left     What other symptoms do you have   Are you Having Heaviness or Tightness No   Are you having Pain Yes   Are you having pitting edema No   Is it Hard or Difficult finding clothes that fit No   Do you have infections No   Is there Decreased scar mobility No     Lymphedema Assessments   Lymphedema Assessments Upper extremities     Right Upper Extremity Lymphedema   10 cm Proximal to Olecranon Process 24.5 cm   Olecranon Process 23 cm   10 cm Proximal to Ulnar Styloid Process 20 cm   Just Proximal to Ulnar Styloid Process 15 cm   Across Hand at [employer] 18 cm   At [place] of 2nd Digit 6 cm     Left Upper Extremity Lymphedema   10 cm Proximal to Olecranon Process 25 cm   Olecranon Process 22.5 cm   10 cm Proximal to Ulnar Styloid Process 19 cm   Just Proximal to Ulnar Styloid Process 14.5 cm   Across Hand at [employer] 17.5 cm   At [place] of 2nd Digit 6 cm                       OT Education - 01/13/17 1457    Education provided Yes   Education Details Pt provided with information regarding lymphedema: ways to prevent, what to watch for and when to get help. Shoulder stretches provided.    Person(s) Educated Patient   Methods Explanation;Demonstration;Verbal cues;Handout   Comprehension Returned demonstration;Verbalized understanding          OT Short Term Goals - 01/13/17 1503       OT SHORT TERM GOAL #1   Title Patient will be educated and  independent with HEP to increase functional mobility of LUE needed to complete daily tasks.   Time 1   Period Days   Status Achieved     OT SHORT TERM GOAL #2   Title Patient will verbalize understanding of lymphedema, signs and symptoms, way to reduce risk, and when to receive treatment.   Time 1   Period Days   Status Achieved                  Plan - 01/13/17 1459    Clinical Impression Statement A: Patient is a 71 y/o female S/P left breast grade 1 invasive ductal carcinoma causing decreased ROM resulting in more difficulty and discomfort when completing daily tasks.    Occupational Profile and client history currently impacting functional performance Patient is motivated to return to PLOF, independent with daily tasks, currently working part-time.   Occupational performance deficits (Please refer to evaluation for details): ADL's   Rehab Potential Excellent   Current Impairments/barriers affecting progress: Hx of chronic kidney disease, COPD, Diabetes mellitus, dysphagia, GERD, hypertension, and hypercholesterolemia.   OT Frequency One time visit   OT Treatment/Interventions Patient/family education   Plan P: 1 time visit with HEP. Follow up with therapist as needed.    Clinical Decision Making Limited treatment options, no task modification necessary   OT Home Exercise Plan 6/18: shoulder stretches   Consulted and Agree with Plan of Care Patient      Patient will benefit from skilled therapeutic intervention in order to improve the following deficits and impairments:     Visit Diagnosis: Stiffness of left shoulder, not elsewhere classified - Plan: Ot plan of care cert/re-cert  Invasive ductal carcinoma of breast, stage 1, left ([place]) - Plan: Ot plan of care cert/re-cert      G-Codes - 15/40/08 1506    Functional Assessment Tool Used (Outpatient only) clinical judgement   Functional Limitation Carrying,  moving and handling objects   Carrying, Moving and Handling Objects Current Status [phone]) At least 1 percent but less than 20 percent impaired, limited or restricted   Carrying, Moving and Handling Objects Goal Status ([id number]) At least 1 percent but less than 20 percent impaired, limited or restricted   Carrying, Moving and Handling Objects Discharge Status [phone]) At least 1 percent but less than 20 percent impaired, limited or restricted      Problem List Patient Active Problem List   Diagnosis Date Noted  . Breast cancer, left breast ([place]) 12/06/2016  . Breast cancer, left ([place]) 10/31/2016  . History of colonic polyps 03/20/2016  . SBO (small bowel obstruction) ([place]) 03/01/2011  . HTN (hypertension) 03/01/2011  . DM2 (diabetes mellitus, type 2) ([place]) 03/01/2011  . Hyperlipemia 03/01/2011  . Tobacco abuse 03/01/2011  . GERD (gastroesophageal reflux disease) 03/01/2011   Ailene Ravel, OTR/L,CBIS  [phone]  01/13/2017, 3:08 PM  [facility] [address] [place], [place], [address] Phone: [phone]   Fax:  [phone]  Name: Michelle Henry MRN: [id number] Date of Birth: 11/30/1945

## 2017-01-15 ENCOUNTER — Encounter (HOSPITAL_COMMUNITY): Payer: Self-pay

## 2017-01-30 ENCOUNTER — Ambulatory Visit (HOSPITAL_COMMUNITY): Payer: Medicare Other

## 2017-01-30 ENCOUNTER — Encounter (HOSPITAL_COMMUNITY): Payer: Self-pay

## 2017-01-30 ENCOUNTER — Encounter (HOSPITAL_COMMUNITY): Payer: Medicare Other | Attending: Oncology | Admitting: Oncology

## 2017-01-30 VITALS — BP 130/44 | HR 74 | Temp 98.3°F | Resp 16 | Wt 132.6 lb

## 2017-01-30 DIAGNOSIS — Z170 Estrogen receptor positive status [ER+]: Secondary | ICD-10-CM | POA: Insufficient documentation

## 2017-01-30 DIAGNOSIS — C50912 Malignant neoplasm of unspecified site of left female breast: Secondary | ICD-10-CM

## 2017-01-30 MED ORDER — CALCIUM CARBONATE-VITAMIN D 500-200 MG-UNIT PO TABS: 2.0000 | ORAL_TABLET | Freq: Every day | ORAL | 6 refills | Status: DC

## 2017-01-30 MED ORDER — ANASTROZOLE 1 MG PO TABS: 1.0000 mg | ORAL_TABLET | Freq: Every day | ORAL | 2 refills | Status: DC

## 2017-01-30 NOTE — Progress Notes (Signed)
[place]  CONSULT NOTE  Patient Care Team: Baucom, Alfonso Ellis as PCP - General (Physician Assistant)  CHIEF COMPLAINTS/PURPOSE OF CONSULTATION:  Left breast IDC  HISTORY OF PRESENTING ILLNESS:  Michelle Henry 71 y.o.postmenopausal female is here because of referral by [facility] for left breast IDC. 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 She is ER 100% positive, PR 80% positive.   Patient has a past medical history of early stage right breast cancer s/p mastectomy at [facility], chronic kidney disease, COPD, Diabetes mellitus, dysphagia, GERD, hypertension, and hypercholesterolemia.  In 2006 she had a mastectomy for the cancer on her right breast. She did not receive any radiation or chemotherapy at that time. She wants to have mastectomy done for her current left breast carcinoma.    Breast cancer, left ([place])   07/30/2016 Mammogram    Left diagnostic mammogram: Mammographic distortion in the lateral inferior left breast thought to correlate with a region of distortion and a small associated mass seen on ultrasound.      07/30/2016 Imaging    Left breast US: Targeted ultrasound is performed, showing a mass and distortion in the left breast at 4 o'clock, 4 cm from the nipple, thought to correlate with the mammographic finding measuring 7 x 4 x 6 mm. The mass is irregular and taller than wide. No left axillary adenopathy.      08/13/2016 Pathology Results    Left breast core needle biopsy: Breast, left, needle core biopsy, 4:00 - INVASIVE DUCTAL CARCINOMA. - DUCTAL CARCINOMA IN SITU. - SEE COMMENT. Microscopic Comment The invasive carcinoma is grade 1 and is highlighted by a lack of myoepithelial cells as demonstrated by p63, calponin, and smooth muscle myosin stains.  Hormone Profile: ER positive, PR positive, HER2 negative      08/13/2016 Initial Diagnosis    Breast  cancer, left ([place])      09/03/2016 Imaging    MRI breast: IMPRESSION: 1. Architectural distortion involving the lower outer quadrant of the left breast. The tissue marker clip placed at the time of ultrasound biopsy is within the area of distortion. 2. 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 There is no mammographic correlate. 3. No pathologic lymphadenopathy.      12/06/2016 Surgery    Left modified radical mastectomy by Dr. Arnoldo Morale. Her surgical path demonstrated invasive ductal carcinoma with extracellular mucin, grade 1, spanning 2 cm. DCIS grade 1. All margins negative. 0/8 lymph nodes resected were positive for malignancy. Final pathology staging [id number] (Stage IB).       01/14/2017 Oncotype testing    Oncotype Dx recurrence score of 14.      Today patient presents for follow up. She states she is doing well but she still has tenderness at the site of her left mastectomy. She has seen rad-onc who stated she does not need adjuvant RT. Her oncotype Dx score is 14.     MEDICAL HISTORY:  Past Medical History:  Diagnosis Date  . Blood transfusion   . Cancer ([place])   . Chronic kidney disease   . COPD (chronic obstructive pulmonary disease) ([place])   . Diabetes mellitus   . Dysphagia   . GERD (gastroesophageal reflux disease)   . High cholesterol   . Hypertension   . Shortness of breath  SURGICAL HISTORY: Past Surgical History:  Procedure Laterality Date  . BREAST SURGERY    . ESOPHAGOGASTRODUODENOSCOPY (EGD) WITH ESOPHAGEAL DILATION  11/2012   [facility]  . MASTECTOMY  right side  . MASTECTOMY MODIFIED RADICAL Left 12/06/2016   Procedure: MASTECTOMY MODIFIED RADICAL;  Surgeon: [name], MD;  Location: [facility];  Service: General;  Laterality: Left;  . TONSILLECTOMY      SOCIAL HISTORY: Social History   Social History  . Marital status: Single    Spouse  name: N/A  . Number of children: N/A  . Years of education: N/A   Occupational History  . Not on file.   Social History Main Topics  . Smoking status: Current Every Day Smoker    Packs/day: 0.75    Years: 53.00    Types: Cigarettes  . Smokeless tobacco: Never Used  . Alcohol use No  . Drug use: No  . Sexual activity: Yes   Other Topics Concern  . Not on file   Social History Narrative  . No narrative on file    FAMILY HISTORY: History reviewed. No pertinent family history. No family history of breast or ovarian cancer.   ALLERGIES:  has No Known Allergies.  MEDICATIONS:  Current Outpatient Prescriptions  Medication Sig Dispense Refill  . albuterol (PROVENTIL HFA;VENTOLIN HFA) 108 (90 BASE) MCG/ACT inhaler Inhale 1-2 puffs into the lungs every 6 (six) hours as needed for wheezing or shortness of breath.    Marland Kitchen aspirin EC 81 MG tablet Take 81 mg by mouth daily after breakfast.     . beclomethasone (QVAR) 40 MCG/ACT inhaler Inhale 1 puff into the lungs daily.     . benazepril (LOTENSIN) 20 MG tablet Take 20 mg by mouth daily after breakfast.     . diphenhydrAMINE (BENADRYL) 25 MG tablet Take 25 mg by mouth every 6 (six) hours as needed (for sinus/allergies.).    Marland Kitchen docusate sodium (COLACE) 100 MG capsule Take 200 mg by mouth daily after breakfast.    . HYDROcodone-acetaminophen (NORCO/VICODIN) 5-325 MG tablet Take 1-2 tablets by mouth every 4 (four) hours as needed for moderate pain. 40 tablet 0  . hydrOXYzine (ATARAX/VISTARIL) 25 MG tablet Take 25 mg by mouth every 8 (eight) hours as needed for itching.    . Iron-Vitamin C (VITRON-C PO) Take 1 tablet by mouth daily after breakfast.    . lovastatin (MEVACOR) 20 MG tablet Take 20 mg by mouth at bedtime.    . metFORMIN (GLUCOPHAGE) 500 MG tablet Take 500 mg by mouth daily after breakfast.     . naproxen (NAPROSYN) 500 MG tablet Take 500 mg by mouth every 12 (twelve) hours as needed (for pain.).     Marland Kitchen omeprazole (PRILOSEC) 20 MG  capsule Take 20 mg by mouth daily before breakfast.     . anastrozole (ARIMIDEX) 1 MG tablet Take 1 tablet (1 mg total) by mouth daily. 90 tablet 2  . calcium-vitamin D (OSCAL WITH D) 500-200 MG-UNIT tablet Take 2 tablets by mouth daily with breakfast. 60 tablet 6   No current facility-administered medications for this visit.    Facility-Administered Medications Ordered in Other Visits  Medication Dose Route Frequency Provider Last Rate Last Dose  . gadobenate dimeglumine (MULTIHANCE) injection 14 mL  14 mL Intravenous Once PRN Baucom, Lennon Alstrom, PA-C        Review of Systems  Constitutional: Negative for weight loss.  HENT: Negative.   Eyes: Negative.   Respiratory: Negative.  Negative for shortness of breath.  Cardiovascular: Negative.  Negative for chest pain.  Gastrointestinal: Negative.  Negative for abdominal pain.  Genitourinary: Negative.   Musculoskeletal: Negative.   Skin: Negative.   Neurological: Negative.   Endo/Heme/Allergies: Negative.   Psychiatric/Behavioral: Negative.   All other systems reviewed and are negative.  14 point ROS was done and is otherwise as detailed above or in HPI   PHYSICAL EXAMINATION: ECOG PERFORMANCE STATUS: 0 - Asymptomatic  Vitals:   01/30/17 0941  BP: (!) 130/44  Pulse: 74  Resp: 16  Temp: 98.3 F (36.8 C)   Filed Weights   01/30/17 0941  Weight: 132 lb 9.6 oz (60.1 kg)     Physical Exam  Constitutional: She is oriented to person, place, and time and well-developed, well-nourished, and in no distress.  HENT:  Head: Normocephalic and atraumatic.  Eyes: Conjunctivae and EOM are normal. Pupils are equal, round, and reactive to light.  Neck: Normal range of motion. Neck supple.  Cardiovascular: Normal rate, regular rhythm and normal heart sounds.   Pulmonary/Chest: Effort normal and breath sounds normal.    Abdominal: Soft. Bowel sounds are normal.  Musculoskeletal: Normal range of motion.  Neurological: She is alert and  oriented to person, place, and time. Gait normal.  Skin: Skin is warm and dry.  Nursing note and vitals reviewed.     LABORATORY DATA:  I have reviewed the data as listed Lab Results  Component Value Date   WBC 8.1 12/07/2016   HGB 9.7 (L) 12/07/2016   HCT 29.1 (L) 12/07/2016   MCV 89.0 12/07/2016   PLT 221 12/07/2016   CMP     Component Value Date/Time   NA 139 12/07/2016 0547   K 3.5 12/07/2016 0547   CL 105 12/07/2016 0547   CO2 28 12/07/2016 0547   GLUCOSE 105 (H) 12/07/2016 0547   BUN 11 12/07/2016 0547   CREATININE 0.66 12/07/2016 0547   CALCIUM 8.5 (L) 12/07/2016 0547   PROT 6.8 12/02/2016 1437   ALBUMIN 3.8 12/02/2016 1437   AST 21 12/02/2016 1437   ALT 13 (L) 12/02/2016 1437   ALKPHOS 80 12/02/2016 1437   BILITOT 0.4 12/02/2016 1437   GFRNONAA >60 12/07/2016 0547   GFRAA >60 12/07/2016 0547     RADIOGRAPHIC STUDIES: I have personally reviewed the radiological images as listed and agreed with the findings in the report. No results found.   BILATERAL BREAST MRI WITH AND WITHOUT CONTRAST 09/03/2016  IMPRESSION: 1. Architectural distortion involving the lower outer quadrant of the left breast. The tissue marker clip placed at the time of ultrasound biopsy is within the area of distortion. 2. 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 There is no mammographic correlate. 3. No pathologic lymphadenopathy.   MRI GUIDED CORE NEEDLE BIOPSY OF THE LEFT BREAST 10/28/2016  IMPRESSION: MRI guided biopsy of non mass enhancement in the left breast. No apparent complications.  ADDENDUM: Pathology revealed GRADE I INVASIVE DUCTAL CARCINOMA, DUCTAL CARCINOMA IN SITU of the Left breast, lower outer. This was found to be concordant by Dr. Dorise Bullion.   PATHOLOGY:       atientSEANNA, SISLER Collected: 12/06/2016 Client: [facility] Accession: [id number] Received: 12/06/2016 [name] DOB: Sep 10, 1945 Age: 75 Gender: F Reported: 12/09/2016 [address] Patient Ph: [phone] MRN #: [id number] Linna Hoff, [place] [address] Visit #: [id number] Chart #: Phone: [phone] Fax: CC: REPORT OF SURGICAL PATHOLOGY FINAL  DIAGNOSIS Diagnosis 1. Breast, modified radical mastectomy , left INVASIVE DUCTAL CARCINOMA WITH EXTRA CELLULAR MUCIN, GRADE 1, SPANNING 2.0 CM DUCTAL CARCINOMA IN SITU, GRADE 1 ALL MARGINS OF RESECTION ARE NEGATIVE FOR CARCINOMA 2. Lymph nodes, regional resection, left axillary EIGHT BENIGN LYMPH NODES (0/8) Microscopic Comment 1. BREAST, INVASIVE TUMOR Procedure: Modified radical mastectomy Laterality: Left Tumor Size: 2.0 cm Histologic Type: ductal carcinoma Grade: Tubular Differentiation: 2 Nuclear Pleomorphism: 1 Mitotic Count: 1 Ductal Carcinoma in Situ (DCIS): Present Extent of Tumor: Skin: Negative Nipple: Negative Skeletal muscle: Negative Margins: Invasive carcinoma, distance from closest margin: 0.9 cm from inferior margin DCIS, distance from closest margin: 0.9 cm from inferior margin Regional Lymph Nodes: Number of Lymph Nodes Examined: 8 Number of Sentinel Lymph Nodes Examined: 0 Lymph Nodes with Macrometastases: 0 Lymph Nodes with Micrometastases: 0 Lymph Nodes with Isolated Tumor Cells: 0 Breast Prognostic Profile: 1 of 3 FINAL for Michelle Henry, Michelle Henry ([id number]) Microscopic Comment(continued) Estrogen Receptor: 100% Progesterone Receptor: 80% Her2: Negative Ki-67: 5% Best tumor block for sendout testing: 1B Pathologic Stage Classification (pTNM, AJCC 8th Edition): Primary Tumor (pT): pT1c Regional Lymph Nodes (pN): pN0 Distant Metastases (pM): pMx Michelle Lanius MD Pathologist, [facility]  ASSESSMENT & PLAN:  Stage IB (pT1c pN0) IDC ER/PR positive, 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 No adjuvant RT per rad-onc.  Oncotype Dx 14. h/o right breast CA s/p mastectomy  PLAN: Reviewed patient's recurrence score with her from her oncotype Dx; she will not need adjuvant chemotherapy. I have discussed with the patient regarding adjuvant endocrine therapy with aromatase inhibitor and have reviewed potential side effects including worsening bone density, arthralgias, hot flashes with the patient and she is willing to proceed.  I have sent in an Rx for anastrozole and calcium-vitamin D. Scheduled stat DEXA scan to check baseline bone density. RTC in 2 months for follow up with labs.  Orders Placed This Encounter  Procedures  . DG Bone Density    Standing Status:   Future    Standing Expiration Date:   01/30/2018    Order Specific Question:   Reason for Exam (SYMPTOM  OR DIAGNOSIS REQUIRED)    Answer:   assess for osteopenia, on aromatase inhibitor    Order Specific Question:   Preferred imaging location?    Answer:   [facility]  . CBC with Differential    Standing Status:   Future    Standing Expiration Date:   01/30/2018  . Comprehensive metabolic panel    Standing Status:   Future    Standing Expiration Date:   01/30/2018    All questions were answered. The patient knows to call the clinic with any problems, questions or concerns.     Twana First, MD  01/30/2017 12:45 PM

## 2017-02-06 ENCOUNTER — Ambulatory Visit (HOSPITAL_COMMUNITY)
Admission: RE | Admit: 2017-02-06 | Discharge: 2017-02-06 | Disposition: A | Discharge status: Home / Self Care | Payer: Medicare Other | Source: Ambulatory Visit | Attending: Oncology | Admitting: Oncology

## 2017-02-06 DIAGNOSIS — C50912 Malignant neoplasm of unspecified site of left female breast: Secondary | ICD-10-CM

## 2017-02-06 DIAGNOSIS — Z170 Estrogen receptor positive status [ER+]: Secondary | ICD-10-CM | POA: Diagnosis present

## 2017-02-06 DIAGNOSIS — Z780 Asymptomatic menopausal state: Secondary | ICD-10-CM | POA: Diagnosis not present

## 2017-02-06 DIAGNOSIS — Z79811 Long term (current) use of aromatase inhibitors: Secondary | ICD-10-CM | POA: Insufficient documentation

## 2017-02-14 ENCOUNTER — Encounter (INDEPENDENT_AMBULATORY_CARE_PROVIDER_SITE_OTHER): Payer: Self-pay | Admitting: *Deleted

## 2017-02-14 ENCOUNTER — Telehealth (INDEPENDENT_AMBULATORY_CARE_PROVIDER_SITE_OTHER): Payer: Self-pay | Admitting: *Deleted

## 2017-02-14 MED ORDER — PEG 3350-KCL-NA BICARB-NACL 420 G PO SOLR: 4000.0000 mL | Freq: Once | ORAL | 0 refills | Status: AC

## 2017-02-14 NOTE — Telephone Encounter (Signed)
Patient needs trilyte 

## 2017-02-21 ENCOUNTER — Telehealth (INDEPENDENT_AMBULATORY_CARE_PROVIDER_SITE_OTHER): Payer: Self-pay | Admitting: *Deleted

## 2017-02-21 NOTE — Telephone Encounter (Signed)
Referring MD/PCP: [facility]   Procedure: tcs  Reason/Indication: Hx polyps  Has patient had this procedure before? Yes 5-6 yrs ago If so, when, by whom and where?   Is there a family history of colon cancer? no Who? What age when diagnosed?   Is patient diabetic? yes  Does patient have prosthetic heart valve or mechanical valve? no  Do you have a pacemaker? no  Has patient ever had endocarditis? no  Has patient had joint replacement within last 12 months? no  Does patient tend to be constipated or take laxatives? no  Does patient have a history of alcohol/drug use? no  Is patient on Coumadin, Plavix and/or Aspirin? yes  Medications: asa 81 mg daily, naprosyn 500 mg daily, metformin 500 mg daily, omeprazole 20 mg daily, lovastatin 20 mg daily, benazepril 20 mg daily, vit c daily, nasal spray, ferrous sulfate  Allergies: nkda  Medication Adjustment: asa 2 days, iron 10 days, hold metformin morning of procedure  Procedure date &time: 03/28/17 at 1030

## 2017-02-25 NOTE — Telephone Encounter (Signed)
agree

## 2017-03-28 ENCOUNTER — Ambulatory Visit (HOSPITAL_COMMUNITY)
Admission: RE | Admit: 2017-03-28 | Discharge: 2017-03-28 | Disposition: A | Discharge status: Home / Self Care | Payer: Medicare Other | Source: Ambulatory Visit | Attending: Internal Medicine | Admitting: Internal Medicine

## 2017-03-28 ENCOUNTER — Encounter (HOSPITAL_COMMUNITY): Payer: Self-pay | Admitting: *Deleted

## 2017-03-28 ENCOUNTER — Encounter (HOSPITAL_COMMUNITY)
Admission: RE | Disposition: A | Discharge status: Home / Self Care | Payer: Self-pay | Source: Ambulatory Visit | Attending: Internal Medicine

## 2017-03-28 DIAGNOSIS — Z7984 Long term (current) use of oral hypoglycemic drugs: Secondary | ICD-10-CM | POA: Insufficient documentation

## 2017-03-28 DIAGNOSIS — Z1211 Encounter for screening for malignant neoplasm of colon: Secondary | ICD-10-CM | POA: Diagnosis present

## 2017-03-28 DIAGNOSIS — J449 Chronic obstructive pulmonary disease, unspecified: Secondary | ICD-10-CM | POA: Insufficient documentation

## 2017-03-28 DIAGNOSIS — E1122 Type 2 diabetes mellitus with diabetic chronic kidney disease: Secondary | ICD-10-CM | POA: Diagnosis not present

## 2017-03-28 DIAGNOSIS — K644 Residual hemorrhoidal skin tags: Secondary | ICD-10-CM

## 2017-03-28 DIAGNOSIS — F17210 Nicotine dependence, cigarettes, uncomplicated: Secondary | ICD-10-CM | POA: Diagnosis not present

## 2017-03-28 DIAGNOSIS — K648 Other hemorrhoids: Secondary | ICD-10-CM | POA: Insufficient documentation

## 2017-03-28 DIAGNOSIS — Z7982 Long term (current) use of aspirin: Secondary | ICD-10-CM | POA: Diagnosis not present

## 2017-03-28 DIAGNOSIS — Z9012 Acquired absence of left breast and nipple: Secondary | ICD-10-CM | POA: Diagnosis not present

## 2017-03-28 DIAGNOSIS — K5730 Diverticulosis of large intestine without perforation or abscess without bleeding: Secondary | ICD-10-CM | POA: Insufficient documentation

## 2017-03-28 DIAGNOSIS — K219 Gastro-esophageal reflux disease without esophagitis: Secondary | ICD-10-CM | POA: Diagnosis not present

## 2017-03-28 DIAGNOSIS — N189 Chronic kidney disease, unspecified: Secondary | ICD-10-CM | POA: Diagnosis not present

## 2017-03-28 DIAGNOSIS — I129 Hypertensive chronic kidney disease with stage 1 through stage 4 chronic kidney disease, or unspecified chronic kidney disease: Secondary | ICD-10-CM | POA: Insufficient documentation

## 2017-03-28 DIAGNOSIS — E7800 Pure hypercholesterolemia, unspecified: Secondary | ICD-10-CM | POA: Diagnosis not present

## 2017-03-28 DIAGNOSIS — Z79899 Other long term (current) drug therapy: Secondary | ICD-10-CM | POA: Insufficient documentation

## 2017-03-28 DIAGNOSIS — Z09 Encounter for follow-up examination after completed treatment for conditions other than malignant neoplasm: Secondary | ICD-10-CM | POA: Diagnosis not present

## 2017-03-28 DIAGNOSIS — Z86010 Personal history of colonic polyps: Secondary | ICD-10-CM | POA: Insufficient documentation

## 2017-03-28 HISTORY — PX: COLONOSCOPY: SHX5424

## 2017-03-28 LAB — GLUCOSE, CAPILLARY: Glucose-Capillary: 88 mg/dL (ref 65–99)

## 2017-03-28 SURGERY — COLONOSCOPY
Anesthesia: Moderate Sedation

## 2017-03-28 MED ORDER — MIDAZOLAM HCL 5 MG/5ML IJ SOLN
INTRAMUSCULAR | Status: DC | PRN
  Administered 2017-03-28: 1 mg via INTRAVENOUS
  Administered 2017-03-28: 2 mg via INTRAVENOUS

## 2017-03-28 MED ORDER — SIMETHICONE 40 MG/0.6ML PO SUSP
ORAL | Status: DC | PRN
  Administered 2017-03-28: 11:00:00

## 2017-03-28 MED ORDER — MEPERIDINE HCL 50 MG/ML IJ SOLN
INTRAMUSCULAR | Status: AC
  Filled 2017-03-28: qty 1

## 2017-03-28 MED ORDER — MEPERIDINE HCL 50 MG/ML IJ SOLN
INTRAMUSCULAR | Status: DC | PRN
  Administered 2017-03-28 (×2): 25 mg via INTRAVENOUS

## 2017-03-28 MED ORDER — SODIUM CHLORIDE 0.9 % IV SOLN
INTRAVENOUS | Status: DC
  Administered 2017-03-28: 1000 mL via INTRAVENOUS

## 2017-03-28 MED ORDER — MIDAZOLAM HCL 5 MG/5ML IJ SOLN
INTRAMUSCULAR | Status: AC
  Filled 2017-03-28: qty 10

## 2017-03-28 NOTE — Discharge Instructions (Signed)
Resume usual medications and diet. No driving for 24 hours. Next colonoscopy in 5 years   Colonoscopy, Adult, Care After This sheet gives you information about how to care for yourself after your procedure. Your health care provider may also give you more specific instructions. If you have problems or questions, contact your health care provider. What can I expect after the procedure? After the procedure, it is common to have:  A small amount of blood in your stool for 24 hours after the procedure.  Some gas.  Mild abdominal cramping or bloating.  Follow these instructions at home: General instructions   For the first 24 hours after the procedure: ? Do not drive or use machinery. ? Do not sign important documents. ? Do not drink alcohol. ? Do your regular daily activities at a slower pace than normal. ? Eat soft, easy-to-digest foods. ? Rest often.  Take over-the-counter or prescription medicines only as told by your health care provider.  It is up to you to get the results of your procedure. Ask your health care provider, or the department performing the procedure, when your results will be ready. Relieving cramping and bloating  Try walking around when you have cramps or feel bloated.  Apply heat to your abdomen as told by your health care provider. Use a heat source that your health care provider recommends, such as a moist heat pack or a heating pad. ? Place a towel between your skin and the heat source. ? Leave the heat on for 20-30 minutes. ? Remove the heat if your skin turns bright red. This is especially important if you are unable to feel pain, heat, or cold. You may have a greater risk of getting burned. Eating and drinking  Drink enough fluid to keep your urine clear or pale yellow.  Resume your normal diet as instructed by your health care provider. Avoid heavy or fried foods that are hard to digest.  Avoid drinking alcohol for as [place] as instructed by your  health care provider. Contact a health care provider if:  You have blood in your stool 2-3 days after the procedure. Get help right away if:  You have more than a small spotting of blood in your stool.  You pass large blood clots in your stool.  Your abdomen is swollen.  You have nausea or vomiting.  You have a fever.  You have increasing abdominal pain that is not relieved with medicine. This information is not intended to replace advice given to you by your health care provider. Make sure you discuss any questions you have with your health care provider. Document Released: 02/27/2004 Document Revised: 04/08/2016 Document Reviewed: 09/26/2015 Elsevier Interactive Patient Education  [employer].

## 2017-03-28 NOTE — H&P (Signed)
Michelle Henry is an 71 y.o. female.   Chief Complaint: patient is here for colonoscopy. HPI: Patient is 71 year old African-American female was history of colonic adenomas and is here for surveillance colonoscopy. Last exam was in 2011. She denies abdominal pain change in bowel habits or rectal bleeding. Possible history significant for a breast carcinoma She remains in remission. Family history is negative for CRC.  Past Medical History:  Diagnosis Date  . Blood transfusion   . Cancer ([place])   . Chronic kidney disease   . COPD (chronic obstructive pulmonary disease) ([place])   . Diabetes mellitus   . Dysphagia   . GERD (gastroesophageal reflux disease)   . High cholesterol   . Hypertension   . Shortness of breath     Past Surgical History:  Procedure Laterality Date  . BREAST SURGERY    . ESOPHAGOGASTRODUODENOSCOPY (EGD) WITH ESOPHAGEAL DILATION  11/2012   [facility]  . MASTECTOMY  right side  . MASTECTOMY MODIFIED RADICAL Left 12/06/2016   Procedure: MASTECTOMY MODIFIED RADICAL;  Surgeon: [name], MD;  Location: [facility];  Service: General;  Laterality: Left;  . TONSILLECTOMY      Family History  Problem Relation Age of Onset  . Hypertension Mother   . Kidney disease Mother   . Emphysema Father   . Diabetes Brother   . COPD Brother   . Heart attack Brother    Social History:  reports that she has been smoking Cigarettes.  She has a 39.75 pack-year smoking history. She has never used smokeless tobacco. She reports that she does not drink alcohol or use drugs.  Allergies: No Known Allergies  Medications Prior to Admission  Medication Sig Dispense Refill  . albuterol (PROVENTIL HFA;VENTOLIN HFA) 108 (90 BASE) MCG/ACT inhaler Inhale 1-2 puffs into the lungs every 6 (six) hours as needed for wheezing or shortness of breath.    . anastrozole (ARIMIDEX) 1 MG tablet Take 1 tablet (1 mg total) by mouth daily. 90 tablet 2  . beclomethasone (QVAR) 40 MCG/ACT inhaler Inhale 1 puff into the  lungs daily.     . benazepril (LOTENSIN) 20 MG tablet Take 20 mg by mouth daily after breakfast.     . calcium-vitamin D (OSCAL WITH D) 500-200 MG-UNIT tablet Take 2 tablets by mouth daily with breakfast. 60 tablet 6  . diphenhydrAMINE (BENADRYL) 25 MG tablet Take 25 mg by mouth every 6 (six) hours as needed (for sinus/allergies.).    Marland Kitchen hydrOXYzine (ATARAX/VISTARIL) 25 MG tablet Take 25 mg by mouth every 8 (eight) hours as needed for itching.    . metFORMIN (GLUCOPHAGE) 500 MG tablet Take 500 mg by mouth daily after breakfast.     . naproxen (NAPROSYN) 500 MG tablet Take 500 mg by mouth every 12 (twelve) hours as needed (for pain.).     Marland Kitchen omeprazole (PRILOSEC) 20 MG capsule Take 20 mg by mouth daily before breakfast.     . aspirin EC 81 MG tablet Take 81 mg by mouth daily after breakfast.     . docusate sodium (COLACE) 100 MG capsule Take 200 mg by mouth daily after breakfast.    . HYDROcodone-acetaminophen (NORCO/VICODIN) 5-325 MG tablet Take 1-2 tablets by mouth every 4 (four) hours as needed for moderate pain. 40 tablet 0  . Iron-Vitamin C (VITRON-C PO) Take 1 tablet by mouth daily after breakfast.    . lovastatin (MEVACOR) 20 MG tablet Take 20 mg by mouth at bedtime.      Results for orders  placed or performed during the hospital encounter of 03/28/17 (from the past 48 hour(s))  Glucose, capillary     Status: None   Collection Time: 03/28/17 10:01 AM  Result Value Ref Range   Glucose-Capillary 88 65 - 99 mg/dL   No results found.  ROS  Blood pressure (!) 146/60, pulse 77, temperature 97.9 F (36.6 C), temperature source Oral, resp. rate 13, height 5' (1.524 m), weight 156 lb (70.8 kg), SpO2 100 %. Physical Exam  Constitutional: She appears well-developed and well-nourished.  HENT:  Mouth/Throat: Oropharynx is clear and moist.  Eyes: Conjunctivae are normal. No scleral icterus.  Neck: No thyromegaly present.  Cardiovascular: Normal rate, regular rhythm and normal heart sounds.    No murmur heard. Respiratory: Effort normal and breath sounds normal.  GI:  Abdomen is full but soft and nontender without organomeg  Musculoskeletal: She exhibits no edema.  Lymphadenopathy:    She has no cervical adenopathy.  Neurological: She is alert.  Skin: Skin is warm and dry.     Assessment/Plan History of colonic adenomas. Surveillance colonoscopy.  Hildred Laser, MD 03/28/2017, 11:01 AM

## 2017-03-28 NOTE — Op Note (Signed)
[facility] Patient Name: Michelle Henry Procedure Date: 03/28/2017 10:55 AM MRN: [id number] Date of Birth: [birth year] Attending MD: Hildred Laser , MD CSN: [id number] Age: 71 Admit Type: Outpatient Procedure:                Colonoscopy Indications:              High risk colon cancer surveillance: Personal                            history of colonic polyps Providers:                Hildred Laser, MD, Lurline Del, RN, Hinton Rao, RN Referring MD:             Lennon Alstrom. Baucom, PA-C Medicines:                Meperidine 50 mg IV, Midazolam 3 mg IV Complications:            No immediate complications. Estimated Blood Loss:     Estimated blood loss: none. Procedure:                Pre-Anesthesia Assessment:                           - Prior to the procedure, a History and Physical                            was performed, and patient medications and                            allergies were reviewed. The patient's tolerance of                            previous anesthesia was also reviewed. The risks                            and benefits of the procedure and the sedation                            options and risks were discussed with the patient.                            All questions were answered, and informed consent                            was obtained. Prior Anticoagulants: The patient has                            taken no previous anticoagulant or antiplatelet                            agents. ASA Grade Assessment: III - A patient with                            severe systemic disease. After reviewing the risks  and benefits, the patient was deemed in                            satisfactory condition to undergo the procedure.                           After obtaining informed consent, the colonoscope                            was passed under direct vision. Throughout the                            procedure, the patient's blood pressure,  pulse, and                            oxygen saturations were monitored continuously. The                            PCF-H190L ([id number]) scope was introduced through                            the anus and advanced to the the cecum, identified                            by appendiceal orifice and ileocecal valve. The                            colonoscopy was performed without difficulty. The                            patient tolerated the procedure well. The quality                            of the bowel preparation was adequate. The                            ileocecal valve, appendiceal orifice, and rectum                            were photographed. Scope In: 11:11:41 AM Scope Out: 11:34:23 AM Scope Withdrawal Time: 0 hours 8 minutes 56 seconds  Total Procedure Duration: 0 hours 22 minutes 42 seconds  Findings:      The perianal exam findings include skin tags.      A few small-mouthed diverticula were found in the sigmoid colon.      The exam was otherwise normal throughout the examined colon.      External hemorrhoids were found during retroflexion. The hemorrhoids       were small. Impression:               - Perianal skin tags found on perianal exam.                           - Diverticulosis in the sigmoid colon.                           -  External hemorrhoids.                           - No specimens collected. Moderate Sedation:      Moderate (conscious) sedation was administered by the endoscopy nurse       and supervised by the endoscopist. The following parameters were       monitored: oxygen saturation, heart rate, blood pressure, CO2       capnography and response to care. Total physician intraservice time was       28 minutes. Recommendation:           - Patient has a contact number available for                            emergencies. The signs and symptoms of potential                            delayed complications were discussed with the                             patient. Return to normal activities tomorrow.                            Written discharge instructions were provided to the                            patient.                           - High fiber diet today.                           - Continue present medications.                           - Repeat colonoscopy in 5 years for surveillance. Procedure Code(s):        --- Professional ---                           [phone], Colonoscopy, flexible; diagnostic, including                            collection of specimen(s) by brushing or washing,                            when performed (separate procedure)                           99152, Moderate sedation services provided by the                            same physician or other qualified health care                            professional performing the diagnostic or  therapeutic service that the sedation supports,                            requiring the presence of an independent trained                            observer to assist in the monitoring of the                            patient's level of consciousness and physiological                            status; initial 15 minutes of intraservice time,                            patient age 82 years or older                           [phone], Moderate sedation services; each additional                            15 minutes intraservice time Diagnosis Code(s):        --- Professional ---                           K64.4, Residual hemorrhoidal skin tags                           Z86.010, Personal history of colonic polyps                           K57.30, Diverticulosis of large intestine without                            perforation or abscess without bleeding CPT copyright 2016 American Medical Association. All rights reserved. The codes documented in this report are preliminary and upon coder review may  be revised to meet current compliance  requirements. Hildred Laser, MD Hildred Laser, MD 03/28/2017 11:41:29 AM This report has been signed electronically. Number of Addenda: 0

## 2017-04-02 NOTE — Progress Notes (Deleted)
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      07/30/2016 Imaging    Left breast US: Targeted ultrasound is performed, showing a mass and distortion in the left breast at 4 o'clock, 4 cm from the nipple, thought to correlate with the mammographic finding measuring 7 x 4 x 6 mm. The mass is irregular and taller than wide. No left axillary adenopathy.      08/13/2016 Pathology Results    Left breast core needle biopsy: Breast, left, needle core biopsy, 4:00 - INVASIVE DUCTAL CARCINOMA. - DUCTAL CARCINOMA IN SITU. - SEE COMMENT. Microscopic Comment The invasive carcinoma is grade 1 and is highlighted by a lack of myoepithelial cells as demonstrated by p63, calponin, and smooth muscle myosin stains.  Hormone Profile: ER positive, PR positive, HER2 negative      08/13/2016 Initial Diagnosis    Breast cancer, left ([place])      09/03/2016 Imaging    MRI breast: IMPRESSION: 1. Architectural distortion involving the lower outer quadrant of the left breast. The tissue marker clip placed at the time of ultrasound biopsy is within the area of distortion. 2. 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 There is no mammographic correlate. 3. No pathologic lymphadenopathy.      12/06/2016 Surgery    Left modified radical  mastectomy by Dr. Arnoldo Morale. Her surgical path demonstrated invasive ductal carcinoma with extracellular mucin, grade 1, spanning 2 cm. DCIS grade 1. All margins negative. 0/8 lymph nodes resected were positive for malignancy. Final pathology staging [id number] (Stage IB).       01/14/2017 Oncotype testing    Oncotype Dx recurrence score of 14.        HISTORY OF PRESENT ILLNESS:     INTERVAL HISTORY:    REVIEW OF SYSTEMS:  Review of Systems - Oncology   PAST MEDICAL/SURGICAL HISTORY:  Past Medical History:  Diagnosis Date  . Blood transfusion   . Cancer ([place])   . Chronic kidney disease   . COPD (chronic obstructive pulmonary disease) ([place])   . Diabetes mellitus   . Dysphagia   . GERD (gastroesophageal reflux disease)   . High cholesterol   . Hypertension   . Shortness of breath    Past Surgical History:  Procedure Laterality Date  . BREAST SURGERY    . ESOPHAGOGASTRODUODENOSCOPY (EGD) WITH ESOPHAGEAL DILATION  11/2012   [facility]  . MASTECTOMY  right side  . MASTECTOMY MODIFIED RADICAL Left 12/06/2016   Procedure: MASTECTOMY MODIFIED RADICAL;  Surgeon: [name], MD;  Location: [facility];  Service: General;  Laterality: Left;  . TONSILLECTOMY       SOCIAL HISTORY:  Social History   Social History  . Marital status: Single    Spouse name: N/A  . Number of children: N/A  . Years of education: N/A   Occupational History  .  Not on file.   Social History Main Topics  . Smoking status: Current Every Day Smoker    Packs/day: 0.75    Years: 53.00    Types: Cigarettes  . Smokeless tobacco: Never Used  . Alcohol use No  . Drug use: No  . Sexual activity: Yes   Other Topics Concern  . Not on file   Social History Narrative  . No narrative on file    FAMILY HISTORY:  Family History  Problem Relation Age of Onset  . Hypertension Mother   . Kidney disease Mother   . Emphysema Father   . Diabetes Brother   . COPD Brother   . Heart attack Brother      CURRENT MEDICATIONS:  Outpatient Encounter Prescriptions as of 04/03/2017  Medication Sig  . albuterol (PROVENTIL HFA;VENTOLIN HFA) 108 (90 BASE) MCG/ACT inhaler Inhale 1-2 puffs into the lungs every 6 (six) hours as needed for wheezing or shortness of breath.  . anastrozole (ARIMIDEX) 1 MG tablet Take 1 tablet (1 mg total) by mouth daily.  Marland Kitchen aspirin EC 81 MG tablet Take 81 mg by mouth daily after breakfast.   . beclomethasone (QVAR) 40 MCG/ACT inhaler Inhale 1 puff into the lungs daily.   . benazepril (LOTENSIN) 20 MG tablet Take 20 mg by mouth daily after breakfast.   . calcium-vitamin D (OSCAL WITH D) 500-200 MG-UNIT tablet Take 2 tablets by mouth daily with breakfast.  . diphenhydrAMINE (BENADRYL) 25 MG tablet Take 25 mg by mouth every 6 (six) hours as needed (for sinus/allergies.).  Marland Kitchen docusate sodium (COLACE) 100 MG capsule Take 200 mg by mouth daily after breakfast.  . HYDROcodone-acetaminophen (NORCO/VICODIN) 5-325 MG tablet Take 1-2 tablets by mouth every 4 (four) hours as needed for moderate pain.  . hydrOXYzine (ATARAX/VISTARIL) 25 MG tablet Take 25 mg by mouth every 8 (eight) hours as needed for itching.  . Iron-Vitamin C (VITRON-C PO) Take 1 tablet by mouth daily after breakfast.  . lovastatin (MEVACOR) 20 MG tablet Take 20 mg by mouth at bedtime.  . metFORMIN (GLUCOPHAGE) 500 MG tablet Take 500 mg by mouth daily after breakfast.   . naproxen (NAPROSYN) 500 MG tablet Take 500 mg by mouth every 12 (twelve) hours as needed (for pain.).   Marland Kitchen omeprazole (PRILOSEC) 20 MG capsule Take 20 mg by mouth daily before breakfast.    Facility-Administered Encounter Medications as of 04/03/2017  Medication  . gadobenate dimeglumine (MULTIHANCE) injection 14 mL    ALLERGIES:  No Known Allergies   PHYSICAL EXAM:  ECOG Performance status: ***  There were no vitals filed for this visit. There were no vitals filed for this visit.  Physical Exam   LABORATORY DATA:  I have reviewed the  labs as listed.  CBC    Component Value Date/Time   WBC 8.1 12/07/2016 0547   RBC 3.27 (L) 12/07/2016 0547   HGB 9.7 (L) 12/07/2016 0547   HCT 29.1 (L) 12/07/2016 0547   PLT 221 12/07/2016 0547   MCV 89.0 12/07/2016 0547   MCH 29.7 12/07/2016 0547   MCHC 33.3 12/07/2016 0547   RDW 13.6 12/07/2016 0547   LYMPHSABS 3.2 12/02/2016 1437   MONOABS 0.4 12/02/2016 1437   EOSABS 0.2 12/02/2016 1437   BASOSABS 0.0 12/02/2016 1437   CMP Latest Ref Rng & Units 12/07/2016 12/02/2016 09/03/2016  Glucose 65 - 99 mg/dL 105(H) 100(H) -  BUN 6 - 20 mg/dL 11 10 -  Creatinine 0.44 - 1.00 mg/dL 0.66 0.74 0.70  Sodium 135 - 145 mmol/L 139 141 -  Potassium 3.5 - 5.1 mmol/L 3.5 3.7 -  Chloride 101 - 111 mmol/L 105 107 -  CO2 22 - 32 mmol/L 28 24 -  Calcium 8.9 - 10.3 mg/dL 8.5(L) 9.1 -  Total Protein 6.5 - 8.1 g/dL - 6.8 -  Total Bilirubin 0.3 - 1.2 mg/dL - 0.4 -  Alkaline Phos 38 - 126 U/L - 80 -  AST 15 - 41 U/L - 21 -  ALT 14 - 54 U/L - 13(L) -    PENDING LABS:    DIAGNOSTIC IMAGING:  *The following radiologic images and reports have been reviewed independently and agree with below findings.  DEXA: 02/06/17 EXAM: DUAL X-RAY ABSORPTIOMETRY (DXA) FOR BONE MINERAL DENSITY  IMPRESSION: Ordering Physician:  Dr. Twana First,  Your patient Mirranda Monrroy completed a BMD test on 02/06/2017 using the [place] (software version: 14.10) manufactured by [employer]. The following summarizes the results of our evaluation. PATIENT BIOGRAPHICAL: Name: TWANISHA, FOULK Patient ID: [id number] Birth Date: [birth year] Height: 60.0 in. Gender: Female Exam Date: 02/06/2017 Weight: 132.0 lbs. Indications: Height Loss, Hx Breast Ca, Post Menopausal, Tobacco User Fractures: Treatments: Arimidex, Calcium, Vitamin D DENSITOMETRY RESULTS: Site         Region     Measured Date Measured Age WHO Classification Young Adult T-score BMD         %Change vs. Previous Significant Change  (*) DualFemur Neck Left 02/06/2017 68.6 Normal -0.6 0.950 g/cm2  Left Forearm Radius 33% 02/06/2017 68.6 Normal -1.0 0.645 g/cm2  ASSESSMENT: BMD as determined from Forearm Radius 33% is 0.645 g/cm2 with a T-Score of -1.0. This patient is considered normal according to [facility]) criteria. 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 Treated with (L) mastectomy by Dr. Arnoldo Morale on 12/06/16; 0/8 axillary lymph nodes.  Oncotype score 14; chemotherapy was not recommended by Dr. Talbert Cage. She did not require adjuvant radiation therapy.  Started Anastrozole 01/2017.  -Tolerating Arimidex well with minimal side effects. *** -Clinical breast exam performed today and benign. No role for mammogram given mastectomy.  -Return to cancer center in 4 months for follow-up visit.   History of (R) breast cancer:  -s/p mastectomy.  -Clinical breast exam benign today. No role for mammography given mastectomy. Will continue clinical surveillance with breast exam.   Bone health:  -Baseline DEXA scan performed on 02/06/17 and showed normal bone density with T-score -1.0.  -Continue calcium/vitamin D supplementation and increase weight-bearing exercises as tolerated.       Dispo:  -Return to cancer center in 4 months for follow-up. ***   All questions were answered to patient's stated satisfaction. Encouraged patient to call with any new concerns or questions before her next visit to the cancer center and we can certain see her sooner, if needed.    Plan of care  discussed with Dr. Talbert Cage, who agrees with the above aforementioned.    Orders placed this encounter:  No orders of the defined types were placed in  this encounter.     Mike Craze, NP [place] [phone]

## 2017-04-03 ENCOUNTER — Ambulatory Visit (HOSPITAL_COMMUNITY): Payer: Medicare Other | Admitting: Adult Health

## 2017-04-03 ENCOUNTER — Encounter (HOSPITAL_COMMUNITY): Payer: Self-pay | Admitting: Internal Medicine

## 2017-04-03 ENCOUNTER — Other Ambulatory Visit (HOSPITAL_COMMUNITY): Payer: Medicare Other

## 2017-05-02 ENCOUNTER — Encounter (HOSPITAL_COMMUNITY): Payer: Medicare Other | Attending: Oncology | Admitting: Oncology

## 2017-05-02 ENCOUNTER — Encounter (HOSPITAL_COMMUNITY): Payer: Medicare Other

## 2017-05-02 ENCOUNTER — Encounter (HOSPITAL_COMMUNITY): Payer: Self-pay

## 2017-05-02 VITALS — BP 124/59 | HR 97 | Resp 20 | Ht 60.0 in | Wt 143.5 lb

## 2017-05-02 DIAGNOSIS — Z170 Estrogen receptor positive status [ER+]: Secondary | ICD-10-CM

## 2017-05-02 DIAGNOSIS — C50912 Malignant neoplasm of unspecified site of left female breast: Secondary | ICD-10-CM

## 2017-05-02 DIAGNOSIS — C50412 Malignant neoplasm of upper-outer quadrant of left female breast: Secondary | ICD-10-CM

## 2017-05-02 DIAGNOSIS — N189 Chronic kidney disease, unspecified: Secondary | ICD-10-CM

## 2017-05-02 DIAGNOSIS — Z79811 Long term (current) use of aromatase inhibitors: Secondary | ICD-10-CM | POA: Diagnosis not present

## 2017-05-02 LAB — COMPREHENSIVE METABOLIC PANEL
ALT: 15 U/L (ref 14–54)
AST: 22 U/L (ref 15–41)
Albumin: 4 g/dL (ref 3.5–5.0)
Alkaline Phosphatase: 83 U/L (ref 38–126)
Anion gap: 11 (ref 5–15)
BUN: 11 mg/dL (ref 6–20)
CO2: 24 mmol/L (ref 22–32)
Calcium: 9.3 mg/dL (ref 8.9–10.3)
Chloride: 101 mmol/L (ref 101–111)
Creatinine, Ser: 1.12 mg/dL — ABNORMAL HIGH (ref 0.44–1.00)
GFR calc Af Amer: 57 mL/min — ABNORMAL LOW (ref >60)
GFR calc non Af Amer: 49 mL/min — ABNORMAL LOW (ref >60)
Glucose, Bld: 127 mg/dL — ABNORMAL HIGH (ref 65–99)
Potassium: 4.3 mmol/L (ref 3.5–5.1)
Sodium: 136 mmol/L (ref 135–145)
Total Bilirubin: 0.4 mg/dL (ref 0.3–1.2)
Total Protein: 7.6 g/dL (ref 6.5–8.1)

## 2017-05-02 LAB — CBC WITH DIFFERENTIAL/PLATELET
Basophils Absolute: 0 10*3/uL (ref 0.0–0.1)
Basophils Relative: 0 %
Eosinophils Absolute: 0.2 10*3/uL (ref 0.0–0.7)
Eosinophils Relative: 3 %
HCT: 32.3 % — ABNORMAL LOW (ref 36.0–46.0)
Hemoglobin: 10.5 g/dL — ABNORMAL LOW (ref 12.0–15.0)
Lymphocytes Relative: 47 %
Lymphs Abs: 3.5 10*3/uL (ref 0.7–4.0)
MCH: 27.6 pg (ref 26.0–34.0)
MCHC: 32.5 g/dL (ref 30.0–36.0)
MCV: 85 fL (ref 78.0–100.0)
Monocytes Absolute: 0.4 10*3/uL (ref 0.1–1.0)
Monocytes Relative: 6 %
Neutro Abs: 3.3 10*3/uL (ref 1.7–7.7)
Neutrophils Relative %: 44 %
Platelets: 279 10*3/uL (ref 150–400)
RBC: 3.8 MIL/uL — ABNORMAL LOW (ref 3.87–5.11)
RDW: 13.1 % (ref 11.5–15.5)
WBC: 7.5 10*3/uL (ref 4.0–10.5)

## 2017-05-02 MED ORDER — ANASTROZOLE 1 MG PO TABS: 1.0000 mg | ORAL_TABLET | Freq: Every day | ORAL | 2 refills | Status: DC

## 2017-05-02 NOTE — Progress Notes (Signed)
[facility]  CONSULT NOTE  Patient Care Team: Baucom, Alfonso Ellis as PCP - General (Physician Assistant)  CHIEF COMPLAINTS/PURPOSE OF CONSULTATION:  Left breast IDC  HISTORY OF PRESENTING ILLNESS:  Michelle Henry 70 y.o.postmenopausal female is here because of referral by [facility] for left breast IDC. 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 She is ER 100% positive, PR 80% positive.   Patient has a past medical history of early stage right breast cancer s/p mastectomy at [facility], chronic kidney disease, COPD, Diabetes mellitus, dysphagia, GERD, hypertension, and hypercholesterolemia.  In 2006 she had a mastectomy for the cancer on her right breast. She did not receive any radiation or chemotherapy at that time. She wants to have mastectomy done for her current left breast carcinoma.    Breast cancer, left ([place])   07/30/2016 Mammogram    Left diagnostic mammogram: Mammographic distortion in the lateral inferior left breast thought to correlate with a region of distortion and a small associated mass seen on ultrasound.      07/30/2016 Imaging    Left breast US: Targeted ultrasound is performed, showing a mass and distortion in the left breast at 4 o'clock, 4 cm from the nipple, thought to correlate with the mammographic finding measuring 7 x 4 x 6 mm. The mass is irregular and taller than wide. No left axillary adenopathy.      08/13/2016 Pathology Results    Left breast core needle biopsy: Breast, left, needle core biopsy, 4:00 - INVASIVE DUCTAL CARCINOMA. - DUCTAL CARCINOMA IN SITU. - SEE COMMENT. Microscopic Comment The invasive carcinoma is grade 1 and is highlighted by a lack of myoepithelial cells as demonstrated by p63, calponin, and smooth muscle myosin stains.  Hormone Profile: ER positive, PR positive, HER2 negative      08/13/2016 Initial Diagnosis    Breast  cancer, left ([place])      09/03/2016 Imaging    MRI breast: IMPRESSION: 1. Architectural distortion involving the lower outer quadrant of the left breast. The tissue marker clip placed at the time of ultrasound biopsy is within the area of distortion. 2. 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 There is no mammographic correlate. 3. No pathologic lymphadenopathy.      12/06/2016 Surgery    Left modified radical mastectomy by Dr. Arnoldo Morale. Her surgical path demonstrated invasive ductal carcinoma with extracellular mucin, grade 1, spanning 2 cm. DCIS grade 1. All margins negative. 0/8 lymph nodes resected were positive for malignancy. Final pathology staging pT1cpN0pMx (Stage IB).       01/14/2017 Oncotype testing    Oncotype Dx recurrence score of 14.      Today patient presents for follow up. She states she is doing well but she still has tenderness at the site of her left mastectomy and left axilla. She has been taking anastrozole and calcium-vitamin D and has been tolerating both well without side effects. She denies any hot flashes, chest pain, shortness breath, abdominal pain, recent infections, focal weakness. She does have arthralgias but they have not gotten worse and were present prior to starting aromatase inhibitor.   MEDICAL HISTORY:  Past Medical History:  Diagnosis Date  . Blood transfusion   . Cancer ([place])   . Chronic kidney disease   . COPD (chronic obstructive pulmonary disease) ([place])   .  Diabetes mellitus   . Dysphagia   . GERD (gastroesophageal reflux disease)   . High cholesterol   . Hypertension   . Shortness of breath     SURGICAL HISTORY: Past Surgical History:  Procedure Laterality Date  . BREAST SURGERY    . COLONOSCOPY N/A 03/28/2017   Procedure: COLONOSCOPY;  Surgeon: Rogene Houston, MD;  Location: [facility];  Service: Endoscopy;   Laterality: N/A;  1200 - moved to 9/5 @ 10:30  . ESOPHAGOGASTRODUODENOSCOPY (EGD) WITH ESOPHAGEAL DILATION  11/2012   [facility]  . MASTECTOMY  right side  . MASTECTOMY MODIFIED RADICAL Left 12/06/2016   Procedure: MASTECTOMY MODIFIED RADICAL;  Surgeon: [name], MD;  Location: [facility] ORS;  Service: General;  Laterality: Left;  . TONSILLECTOMY      SOCIAL HISTORY: Social History   Social History  . Marital status: Single    Spouse name: N/A  . Number of children: N/A  . Years of education: N/A   Occupational History  . Not on file.   Social History Main Topics  . Smoking status: Current Every Day Smoker    Packs/day: 0.75    Years: 53.00    Types: Cigarettes  . Smokeless tobacco: Never Used  . Alcohol use No  . Drug use: No  . Sexual activity: Yes   Other Topics Concern  . Not on file   Social History Narrative  . No narrative on file    FAMILY HISTORY: Family History  Problem Relation Age of Onset  . Hypertension Mother   . Kidney disease Mother   . Emphysema Father   . Diabetes Brother   . COPD Brother   . Heart attack Brother    No family history of breast or ovarian cancer.   ALLERGIES:  has No Known Allergies.  MEDICATIONS:  Current Outpatient Prescriptions  Medication Sig Dispense Refill  . albuterol (PROVENTIL HFA;VENTOLIN HFA) 108 (90 BASE) MCG/ACT inhaler Inhale 1-2 puffs into the lungs every 6 (six) hours as needed for wheezing or shortness of breath.    . anastrozole (ARIMIDEX) 1 MG tablet Take 1 tablet (1 mg total) by mouth daily. 90 tablet 2  . beclomethasone (QVAR) 40 MCG/ACT inhaler Inhale 1 puff into the lungs daily.     . benazepril (LOTENSIN) 20 MG tablet Take 20 mg by mouth daily after breakfast.     . calcium-vitamin D (OSCAL WITH D) 500-200 MG-UNIT tablet Take 2 tablets by mouth daily with breakfast. 60 tablet 6  . diphenhydrAMINE (BENADRYL) 25 MG tablet Take 25 mg by mouth every 6 (six) hours as needed (for sinus/allergies.).    Marland Kitchen docusate  sodium (COLACE) 100 MG capsule Take 200 mg by mouth daily after breakfast.    . hydrOXYzine (ATARAX/VISTARIL) 25 MG tablet Take 25 mg by mouth every 8 (eight) hours as needed for itching.    . lovastatin (MEVACOR) 20 MG tablet Take 20 mg by mouth at bedtime.    . metFORMIN (GLUCOPHAGE) 500 MG tablet Take 500 mg by mouth daily after breakfast.     . naproxen (NAPROSYN) 500 MG tablet Take 500 mg by mouth every 12 (twelve) hours as needed (for pain.).     Marland Kitchen omeprazole (PRILOSEC) 20 MG capsule Take 20 mg by mouth daily before breakfast.     . aspirin EC 81 MG tablet Take 81 mg by mouth daily after breakfast.     . Iron-Vitamin C (VITRON-C PO) Take 1 tablet by mouth daily after breakfast.  No current facility-administered medications for this visit.    Facility-Administered Medications Ordered in Other Visits  Medication Dose Route Frequency Provider Last Rate Last Dose  . gadobenate dimeglumine (MULTIHANCE) injection 14 mL  14 mL Intravenous Once PRN Baucom, Lennon Alstrom, PA-C        Review of Systems  Constitutional: Negative for weight loss.  HENT: Negative.   Eyes: Negative.   Respiratory: Negative.  Negative for shortness of breath.   Cardiovascular: Negative.  Negative for chest pain.  Gastrointestinal: Negative.  Negative for abdominal pain.  Genitourinary: Negative.   Musculoskeletal: Negative.   Skin: Negative.   Neurological: Negative.   Endo/Heme/Allergies: Negative.   Psychiatric/Behavioral: Negative.   All other systems reviewed and are negative.  14 point ROS was done and is otherwise as detailed above or in HPI   PHYSICAL EXAMINATION: ECOG PERFORMANCE STATUS: 0 - Asymptomatic  Vitals:   05/02/17 1457  BP: (!) 124/59  Pulse: 97  Resp: 20  SpO2: 100%   Filed Weights   05/02/17 1457  Weight: 143 lb 8 oz (65.1 kg)     Physical Exam  Constitutional: She is oriented to person, place, and time and well-developed, well-nourished, and in no distress.  HENT:  Head:  Normocephalic and atraumatic.  Eyes: Pupils are equal, round, and reactive to light. Conjunctivae and EOM are normal.  Neck: Normal range of motion. Neck supple.  Cardiovascular: Normal rate, regular rhythm and normal heart sounds.   Pulmonary/Chest: Effort normal and breath sounds normal.    Abdominal: Soft. Bowel sounds are normal.  Musculoskeletal: Normal range of motion.  Neurological: She is alert and oriented to person, place, and time. Gait normal.  Skin: Skin is warm and dry.  Nursing note and vitals reviewed.     LABORATORY DATA:  I have reviewed the data as listed Lab Results  Component Value Date   WBC 7.5 05/02/2017   HGB 10.5 (L) 05/02/2017   HCT 32.3 (L) 05/02/2017   MCV 85.0 05/02/2017   PLT 279 05/02/2017   CMP     Component Value Date/Time   NA 136 05/02/2017 1414   K 4.3 05/02/2017 1414   CL 101 05/02/2017 1414   CO2 24 05/02/2017 1414   GLUCOSE 127 (H) 05/02/2017 1414   BUN 11 05/02/2017 1414   CREATININE 1.12 (H) 05/02/2017 1414   CALCIUM 9.3 05/02/2017 1414   PROT 7.6 05/02/2017 1414   ALBUMIN 4.0 05/02/2017 1414   AST 22 05/02/2017 1414   ALT 15 05/02/2017 1414   ALKPHOS 83 05/02/2017 1414   BILITOT 0.4 05/02/2017 1414   GFRNONAA 49 (L) 05/02/2017 1414   GFRAA 57 (L) 05/02/2017 1414     RADIOGRAPHIC STUDIES: I have personally reviewed the radiological images as listed and agreed with the findings in the report. No results found.   BILATERAL BREAST MRI WITH AND WITHOUT CONTRAST 09/03/2016  IMPRESSION: 1. Architectural distortion involving the lower outer quadrant of the left breast. The tissue marker clip placed at the time of ultrasound biopsy is within the area of distortion. 2. 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 There is no mammographic correlate. 3. No pathologic lymphadenopathy.   MRI GUIDED CORE NEEDLE BIOPSY OF  THE LEFT BREAST 10/28/2016  IMPRESSION: MRI guided biopsy of non mass enhancement in the left breast. No apparent complications.  ADDENDUM: Pathology revealed GRADE I INVASIVE DUCTAL CARCINOMA, DUCTAL CARCINOMA IN  SITU of the Left breast, lower outer. This was found to be concordant by Dr. Dorise Bullion.   PATHOLOGY:       atientBRISEIS, AGUILERA Collected: 12/06/2016 Client: [facility] Accession: [id number] Received: 12/06/2016 [name] DOB: 1946-06-02 Age: 88 Gender: F Reported: 12/09/2016 [address] Patient Ph: [phone] MRN #: [id number] Linna Hoff, [place] [address] Visit #: [id number] Chart #: Phone: [phone] Fax: CC: REPORT OF SURGICAL PATHOLOGY FINAL DIAGNOSIS Diagnosis 1. Breast, modified radical mastectomy , left INVASIVE DUCTAL CARCINOMA WITH EXTRA CELLULAR MUCIN, GRADE 1, SPANNING 2.0 CM DUCTAL CARCINOMA IN SITU, GRADE 1 ALL MARGINS OF RESECTION ARE NEGATIVE FOR CARCINOMA 2. Lymph nodes, regional resection, left axillary EIGHT BENIGN LYMPH NODES (0/8) Microscopic Comment 1. BREAST, INVASIVE TUMOR Procedure: Modified radical mastectomy Laterality: Left Tumor Size: 2.0 cm Histologic Type: ductal carcinoma Grade: Tubular Differentiation: 2 Nuclear Pleomorphism: 1 Mitotic Count: 1 Ductal Carcinoma in Situ (DCIS): Present Extent of Tumor: Skin: Negative Nipple: Negative Skeletal muscle: Negative Margins: Invasive carcinoma, distance from closest margin: 0.9 cm from inferior margin DCIS, distance from closest margin: 0.9 cm from inferior margin Regional Lymph Nodes: Number of Lymph Nodes Examined: 8 Number of Sentinel Lymph Nodes Examined: 0 Lymph Nodes with Macrometastases: 0 Lymph Nodes with Micrometastases: 0 Lymph Nodes with Isolated Tumor Cells: 0 Breast Prognostic Profile: 1 of 3 FINAL for Tedder, Rosabel M ([id number]) Microscopic Comment(continued) Estrogen Receptor: 100% Progesterone Receptor: 80% Her2: Negative Ki-67: 5% Best  tumor block for sendout testing: 1B Pathologic Stage Classification (pTNM, AJCC 8th Edition): Primary Tumor (pT): pT1c Regional Lymph Nodes (pN): pN0 Distant Metastases (pM): pMx Casimer Lanius MD Pathologist, [employer]  ASSESSMENT & PLAN:  Stage IB (pT1c pN0) IDC ER/PR positive, 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 No adjuvant RT per rad-onc. Oncotype Dx 14. h/o right breast CA s/p mastectomy DEXA normal  PLAN: I have reassured patient again that I do not feel any mass or swelling in her left chest wall. She does have some residual breast tissue but no palpable masses or axillary lymphadenopathy. She is tolerating anastrozole very well. Continue anastrozole 1 mg by mouth daily as well as calcium and vitamin D supplementation. RTC in 6 months for follow up with labs.  Orders Placed This Encounter  Procedures  . CBC with Differential    Standing Status:   Future    Standing Expiration Date:   05/02/2018  . Comprehensive metabolic panel    Standing Status:   Future    Standing Expiration Date:   05/02/2018    All questions were answered. The patient knows to call the clinic with any problems, questions or concerns.     Twana First, MD  05/02/2017 2:57 PM

## 2017-10-30 ENCOUNTER — Other Ambulatory Visit (HOSPITAL_COMMUNITY): Payer: Self-pay

## 2017-10-30 DIAGNOSIS — C50912 Malignant neoplasm of unspecified site of left female breast: Secondary | ICD-10-CM

## 2017-10-30 DIAGNOSIS — Z170 Estrogen receptor positive status [ER+]: Principal | ICD-10-CM

## 2017-10-31 ENCOUNTER — Inpatient Hospital Stay (HOSPITAL_COMMUNITY): Payer: Medicare Other | Attending: Internal Medicine

## 2017-10-31 DIAGNOSIS — Z7982 Long term (current) use of aspirin: Secondary | ICD-10-CM | POA: Insufficient documentation

## 2017-10-31 DIAGNOSIS — Z79899 Other long term (current) drug therapy: Secondary | ICD-10-CM | POA: Diagnosis not present

## 2017-10-31 DIAGNOSIS — Z853 Personal history of malignant neoplasm of breast: Secondary | ICD-10-CM | POA: Diagnosis not present

## 2017-10-31 DIAGNOSIS — C50912 Malignant neoplasm of unspecified site of left female breast: Secondary | ICD-10-CM

## 2017-10-31 DIAGNOSIS — Z7984 Long term (current) use of oral hypoglycemic drugs: Secondary | ICD-10-CM | POA: Insufficient documentation

## 2017-10-31 DIAGNOSIS — C50412 Malignant neoplasm of upper-outer quadrant of left female breast: Secondary | ICD-10-CM | POA: Insufficient documentation

## 2017-10-31 DIAGNOSIS — Z170 Estrogen receptor positive status [ER+]: Secondary | ICD-10-CM | POA: Diagnosis not present

## 2017-10-31 DIAGNOSIS — Z9013 Acquired absence of bilateral breasts and nipples: Secondary | ICD-10-CM | POA: Diagnosis not present

## 2017-10-31 DIAGNOSIS — R232 Flushing: Secondary | ICD-10-CM | POA: Diagnosis not present

## 2017-10-31 LAB — COMPREHENSIVE METABOLIC PANEL
ALT: 12 U/L — ABNORMAL LOW (ref 14–54)
AST: 20 U/L (ref 15–41)
Albumin: 3.8 g/dL (ref 3.5–5.0)
Alkaline Phosphatase: 83 U/L (ref 38–126)
Anion gap: 12 (ref 5–15)
BUN: 11 mg/dL (ref 6–20)
CO2: 23 mmol/L (ref 22–32)
Calcium: 9.3 mg/dL (ref 8.9–10.3)
Chloride: 107 mmol/L (ref 101–111)
Creatinine, Ser: 0.92 mg/dL (ref 0.44–1.00)
GFR calc Af Amer: >60 mL/min (ref >60)
GFR calc non Af Amer: >60 mL/min (ref >60)
Glucose, Bld: 166 mg/dL — ABNORMAL HIGH (ref 65–99)
Potassium: 3.3 mmol/L — ABNORMAL LOW (ref 3.5–5.1)
Sodium: 142 mmol/L (ref 135–145)
Total Bilirubin: 0.3 mg/dL (ref 0.3–1.2)
Total Protein: 7.2 g/dL (ref 6.5–8.1)

## 2017-10-31 LAB — CBC WITH DIFFERENTIAL/PLATELET
Basophils Absolute: 0 10*3/uL (ref 0.0–0.1)
Basophils Relative: 0 %
Eosinophils Absolute: 0.2 10*3/uL (ref 0.0–0.7)
Eosinophils Relative: 3 %
HCT: 35.2 % — ABNORMAL LOW (ref 36.0–46.0)
Hemoglobin: 11.4 g/dL — ABNORMAL LOW (ref 12.0–15.0)
Lymphocytes Relative: 44 %
Lymphs Abs: 3 10*3/uL (ref 0.7–4.0)
MCH: 29 pg (ref 26.0–34.0)
MCHC: 32.4 g/dL (ref 30.0–36.0)
MCV: 89.6 fL (ref 78.0–100.0)
Monocytes Absolute: 0.5 10*3/uL (ref 0.1–1.0)
Monocytes Relative: 8 %
Neutro Abs: 3 10*3/uL (ref 1.7–7.7)
Neutrophils Relative %: 45 %
Platelets: 240 10*3/uL (ref 150–400)
RBC: 3.93 MIL/uL (ref 3.87–5.11)
RDW: 13 % (ref 11.5–15.5)
WBC: 6.8 10*3/uL (ref 4.0–10.5)

## 2017-11-07 ENCOUNTER — Inpatient Hospital Stay (HOSPITAL_BASED_OUTPATIENT_CLINIC_OR_DEPARTMENT_OTHER): Payer: Medicare Other | Admitting: Hematology

## 2017-11-07 ENCOUNTER — Other Ambulatory Visit: Payer: Self-pay

## 2017-11-07 ENCOUNTER — Encounter (HOSPITAL_COMMUNITY): Payer: Self-pay | Admitting: Hematology

## 2017-11-07 VITALS — BP 158/66 | HR 77 | Resp 16 | Ht 60.0 in | Wt 150.1 lb

## 2017-11-07 DIAGNOSIS — Z170 Estrogen receptor positive status [ER+]: Secondary | ICD-10-CM | POA: Diagnosis not present

## 2017-11-07 DIAGNOSIS — Z7982 Long term (current) use of aspirin: Secondary | ICD-10-CM | POA: Diagnosis not present

## 2017-11-07 DIAGNOSIS — C50412 Malignant neoplasm of upper-outer quadrant of left female breast: Secondary | ICD-10-CM

## 2017-11-07 DIAGNOSIS — Z9013 Acquired absence of bilateral breasts and nipples: Secondary | ICD-10-CM | POA: Diagnosis not present

## 2017-11-07 DIAGNOSIS — Z7984 Long term (current) use of oral hypoglycemic drugs: Secondary | ICD-10-CM

## 2017-11-07 DIAGNOSIS — R232 Flushing: Secondary | ICD-10-CM | POA: Diagnosis not present

## 2017-11-07 DIAGNOSIS — Z853 Personal history of malignant neoplasm of breast: Secondary | ICD-10-CM | POA: Diagnosis not present

## 2017-11-07 DIAGNOSIS — Z79899 Other long term (current) drug therapy: Secondary | ICD-10-CM

## 2017-11-07 NOTE — Progress Notes (Signed)
Patient Care Team: Vesta Mixer as PCP - General (Physician Assistant)  DIAGNOSIS:  Encounter Diagnosis  Name Primary?  . Malignant neoplasm of upper-outer quadrant of left breast in female, estrogen receptor positive ([name]) Yes    SUMMARY OF ONCOLOGIC HISTORY:   Breast cancer, left ([name])   07/30/2016 Mammogram    Left diagnostic mammogram: Mammographic distortion in the lateral inferior left breast thought to correlate with a region of distortion and a small associated mass seen on ultrasound.      07/30/2016 Imaging    Left breast US: Targeted ultrasound is performed, showing a mass and distortion in the left breast at 4 o'clock, 4 cm from the nipple, thought to correlate with the mammographic finding measuring 7 x 4 x 6 mm. The mass is irregular and taller than wide. No left axillary adenopathy.      08/13/2016 Pathology Results    Left breast core needle biopsy: Breast, left, needle core biopsy, 4:00 - INVASIVE DUCTAL CARCINOMA. - DUCTAL CARCINOMA IN SITU. - SEE COMMENT. Microscopic Comment The invasive carcinoma is grade 1 and is highlighted by a lack of myoepithelial cells as demonstrated by p63, calponin, and smooth muscle myosin stains.  Hormone Profile: ER positive, PR positive, HER2 negative      08/13/2016 Initial Diagnosis    Breast cancer, left ([name])      09/03/2016 Imaging    MRI breast: IMPRESSION: 1. Architectural distortion involving the lower outer quadrant of the left breast. The tissue marker clip placed at the time of ultrasound biopsy is within the area of distortion. 2. 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 There is no mammographic correlate. 3. No pathologic lymphadenopathy.      12/06/2016 Surgery    Left modified radical mastectomy by Dr. Arnoldo Morale. Her surgical path demonstrated invasive ductal carcinoma with  extracellular mucin, grade 1, spanning 2 cm. DCIS grade 1. All margins negative. 0/8 lymph nodes resected were positive for malignancy. Final pathology staging pT1cpN0pMx (Stage IB).       01/14/2017 Oncotype testing    Oncotype Dx recurrence score of 14.       CHIEF COMPLIANT: Left breast cancer.  INTERVAL HISTORY: Michelle Henry is a 72 year old African-American female who is here for follow-up of left breast cancer.  She is tolerating anastrozole very well.  She denies any new onset pains.  She has occasional hot flashes.  She has some aches and pains which are present even prior to start of anastrozole.  She is taking calcium and  vitamin D twice daily.  She works as a Programmer, applications.  She denies any fevers, night sweats or weight loss.  REVIEW OF SYSTEMS:   Constitutional: Denies fevers, chills or abnormal weight loss Eyes: Denies blurriness of vision Ears, nose, mouth, throat, and face: Denies mucositis or sore throat Respiratory: Denies cough, or wheezes.  She has some shortness of breath on exertion. Cardiovascular: Denies palpitation, chest discomfort Gastrointestinal:  Denies nausea, heartburn.  She has occasional diarrhea and constipation. Skin: Denies abnormal skin rashes Lymphatics: Denies new lymphadenopathy or easy bruising Neurological:Denies numbness, tingling or new weaknesses Behavioral/Psych: Mood is stable, no new changes  Extremities: No lower extremity edema Breast:  denies any pain or lumps or nodules in either breasts All other systems were reviewed with the patient and are negative.  I have reviewed the past medical history, past surgical history,  social history and family history with the patient and they are unchanged from previous note.  ALLERGIES:  has No Known Allergies.  MEDICATIONS:  Current Outpatient Medications  Medication Sig Dispense Refill  . albuterol (PROVENTIL HFA;VENTOLIN HFA) 108 (90 BASE) MCG/ACT inhaler Inhale 1-2 puffs into the lungs  every 6 (six) hours as needed for wheezing or shortness of breath.    . anastrozole (ARIMIDEX) 1 MG tablet Take 1 tablet (1 mg total) by mouth daily. 90 tablet 2  . aspirin EC 81 MG tablet Take 81 mg by mouth daily after breakfast.     . beclomethasone (QVAR) 40 MCG/ACT inhaler Inhale 1 puff into the lungs daily.     . benazepril (LOTENSIN) 20 MG tablet Take 20 mg by mouth daily after breakfast.     . calcium-vitamin D (OSCAL WITH D) 500-200 MG-UNIT tablet Take 2 tablets by mouth daily with breakfast. 60 tablet 6  . diphenhydrAMINE (BENADRYL) 25 MG tablet Take 25 mg by mouth every 6 (six) hours as needed (for sinus/allergies.).    Marland Kitchen docusate sodium (COLACE) 100 MG capsule Take 200 mg by mouth daily after breakfast.    . FLOVENT HFA 220 MCG/ACT inhaler     . fluticasone (FLONASE) 50 MCG/ACT nasal spray     . hydrOXYzine (ATARAX/VISTARIL) 25 MG tablet Take 25 mg by mouth every 8 (eight) hours as needed for itching.    . Iron-Vitamin C (VITRON-C PO) Take 1 tablet by mouth daily after breakfast.    . lovastatin (MEVACOR) 20 MG tablet Take 20 mg by mouth at bedtime.    . metFORMIN (GLUCOPHAGE) 500 MG tablet Take 500 mg by mouth daily after breakfast.     . naproxen (NAPROSYN) 500 MG tablet Take 500 mg by mouth every 12 (twelve) hours as needed (for pain.).     Marland Kitchen omeprazole (PRILOSEC) 20 MG capsule Take 20 mg by mouth daily before breakfast.     . ONE TOUCH ULTRA TEST test strip      No current facility-administered medications for this visit.    Facility-Administered Medications Ordered in Other Visits  Medication Dose Route Frequency Provider Last Rate Last Dose  . gadobenate dimeglumine (MULTIHANCE) injection 14 mL  14 mL Intravenous Once PRN Baucom, Lennon Alstrom, PA-C        PHYSICAL EXAMINATION: ECOG PERFORMANCE STATUS: 0 - Asymptomatic  Vitals:   11/07/17 1604  BP: (!) 158/66  Pulse: 77  Resp: 16  SpO2: 99%   Filed Weights   11/07/17 1604  Weight: 150 lb 1.6 oz (68.1 kg)     GENERAL:alert, no distress and comfortable SKIN: skin color, texture, turgor are normal, no rashes or significant lesions EYES: normal, Conjunctiva are pink and non-injected, sclera clear OROPHARYNX:no mucositis, no erythema and lips, buccal mucosa, and tongue normal  NECK: supple, thyroid normal size, non-tender, without nodularity LYMPH:  no palpable lymphadenopathy in the cervical, axillary or inguinal LUNGS: clear to auscultation and percussion with normal breathing effort HEART: regular rate & rhythm and no murmurs and no lower extremity edema ABDOMEN:abdomen soft, non-tender and normal bowel sounds MUSCULOSKELETAL:no cyanosis of digits and no clubbing   EXTREMITIES: No lower extremity edema BREAST: Bilateral mastectomy sites are within normal limits.  No palpable axillary adenopathy.  LABORATORY DATA:  I have reviewed the data as listed CMP Latest Ref Rng & Units 10/31/2017 05/02/2017 12/07/2016  Glucose 65 - 99 mg/dL 166(H) 127(H) 105(H)  BUN 6 - 20 mg/dL _0 Creatinine 0.44 - 1.00  mg/dL 0.92 1.12(H) 0.66  Sodium 135 - 145 mmol/L 142 136 139  Potassium 3.5 - 5.1 mmol/L 3.3(L) 4.3 3.5  Chloride 101 - 111 mmol/L 107 101 105  CO2 22 - 32 mmol/L _0 Calcium 8.9 - 10.3 mg/dL 9.3 9.3 8.5(L)  Total Protein 6.5 - 8.1 g/dL 7.2 7.6 -  Total Bilirubin 0.3 - 1.2 mg/dL 0.3 0.4 -  Alkaline Phos 38 - 126 U/L 83 83 -  AST 15 - 41 U/L 20 22 -  ALT 14 - 54 U/L 12(L) 15 -   No results found for: APO141   Lab Results  Component Value Date   WBC 6.8 10/31/2017   HGB 11.4 (L) 10/31/2017   HCT 35.2 (L) 10/31/2017   MCV 89.6 10/31/2017   PLT 240 10/31/2017   NEUTROABS 3.0 10/31/2017    ASSESSMENT & PLAN:  Breast cancer, left ([facility]) 1.  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  She had low score on Oncotype DX.  Hence she was placed on anastrozole.  She is tolerating it very well.   She does have occasional hot flashes.  She has some aches and pains even prior to start of anastrozole.  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  Her physical exam and labs today are within normal limits.  She will come back in 6 months for follow-up.  She had a remote history of right breast cancer in 2006 for which she underwent right mastectomy.  I do not have any details of this, it was done at [facility].  She does not report taking any antiestrogen therapy or radiation after that.  2.  Bone density: Her last DEXA scan in July 2018 had a T score of -1.0 which was normal.  We will follow it in 2-3 years.      Breast Cancer therapy associated bone loss: I have recommended calcium, Vitamin D and weight bearing exercises.    Orders Placed This Encounter  Procedures  . CBC    Standing Status:   Future    Standing Expiration Date:   11/07/2018  . Comprehensive metabolic panel    Standing Status:   Future    Standing Expiration Date:   11/07/2018  . Cancer antigen 15-3    Standing Status:   Future    Standing Expiration Date:   11/07/2018   The patient has a good understanding of the overall plan. she agrees with it. she will call with any problems that may develop before the next visit here.   Derek Jack, MD 11/07/17

## 2017-11-07 NOTE — Assessment & Plan Note (Addendum)
1.  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  She had low score on Oncotype DX.  Hence she was placed on anastrozole.  She is tolerating it very well.  She does have occasional hot flashes.  She has some aches and pains even prior to start of anastrozole.  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  Her physical exam and labs today are within normal limits.  She will come back in 6 months for follow-up.  She had a remote history of right breast cancer in 2006 for which she underwent right mastectomy.  I do not have any details of this, it was done at [facility].  She does not report taking any antiestrogen therapy or radiation after that.  2.  Bone density: Her last DEXA scan in July 2018 had a T score of -1.0 which was normal.  We will follow it in 2-3 years.

## 2017-11-25 ENCOUNTER — Other Ambulatory Visit (HOSPITAL_COMMUNITY): Payer: Self-pay

## 2017-11-25 DIAGNOSIS — C50012 Malignant neoplasm of nipple and areola, left female breast: Secondary | ICD-10-CM

## 2017-11-25 MED ORDER — CALCIUM CARBONATE-VITAMIN D 500-200 MG-UNIT PO TABS: 2.0000 | ORAL_TABLET | Freq: Every day | ORAL | 6 refills | Status: DC

## 2017-11-25 NOTE — Telephone Encounter (Signed)
Received refill request from patients pharmacy for calcium + vitamin D.  Reviewed with provider, chart checked and refilled.

## 2017-12-04 IMAGING — US ULTRASOUND LEFT BREAST LIMITED
1 series · 9 of 9 positions shown · non-contrast
Comparison: Previous exam(s).

CLINICAL DATA: The patient was called back for an asymmetry in the
medial inferior left breast at an outside institution 1 year ago. A
six-month follow-up was recommended but not performed. The patient
presents today for follow-up. Previous right mastectomy.

EXAM:
2D DIGITAL DIAGNOSTIC LEFT MAMMOGRAM WITH CAD AND ADJUNCT TOMO
ULTRASOUND LEFT BREAST

[Series 1: ultrasound left breast limited · 0.06mm/px · 9 of 9 slices shown]
[im 1/9]
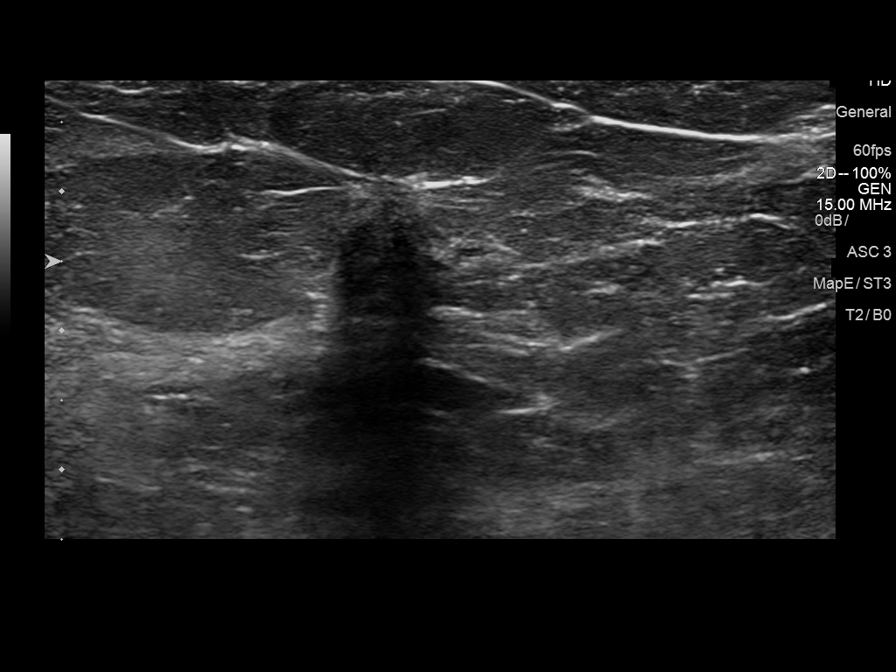
[im 2/9]
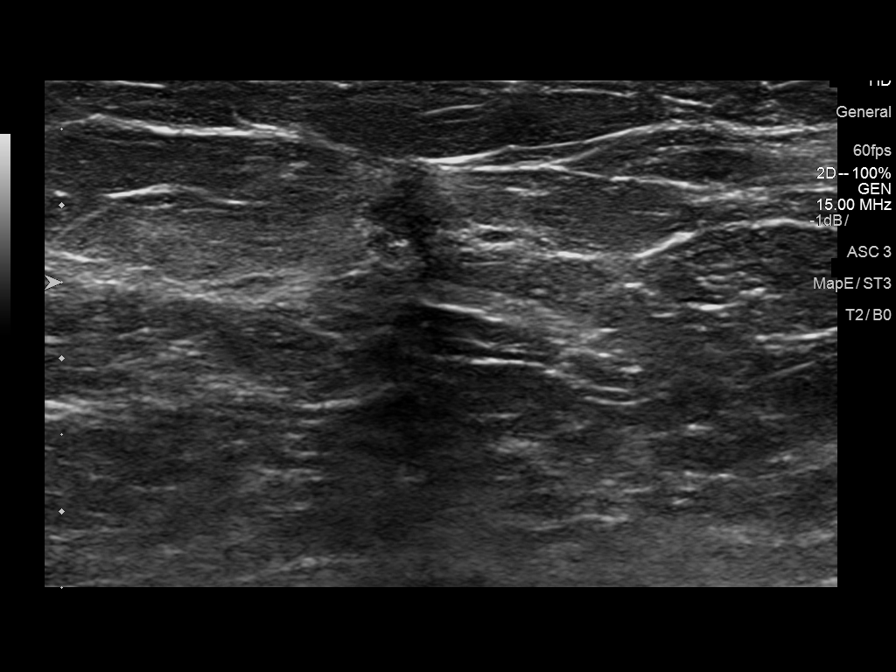
[im 3/9]
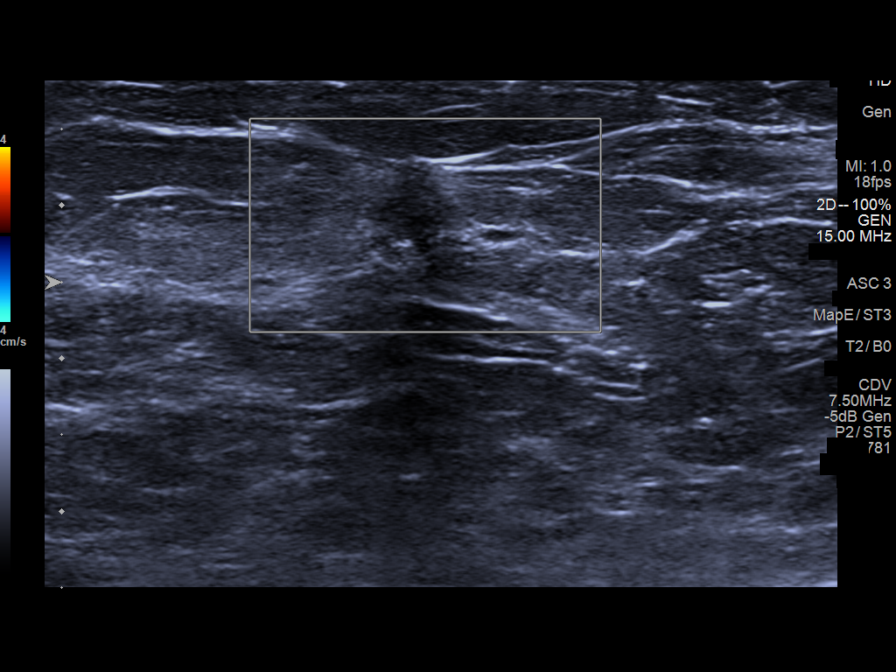
[im 4/9]
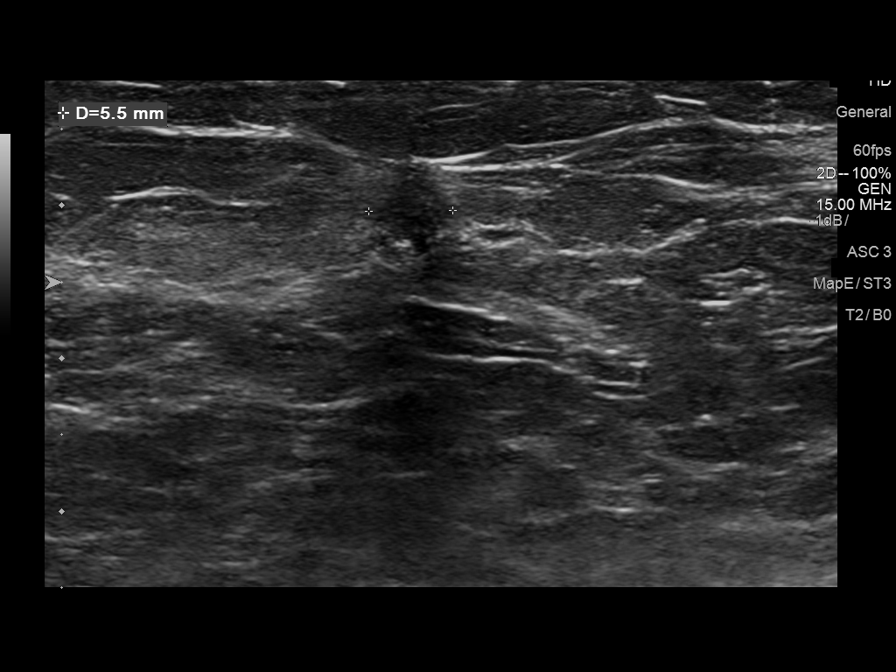
[im 5/9]
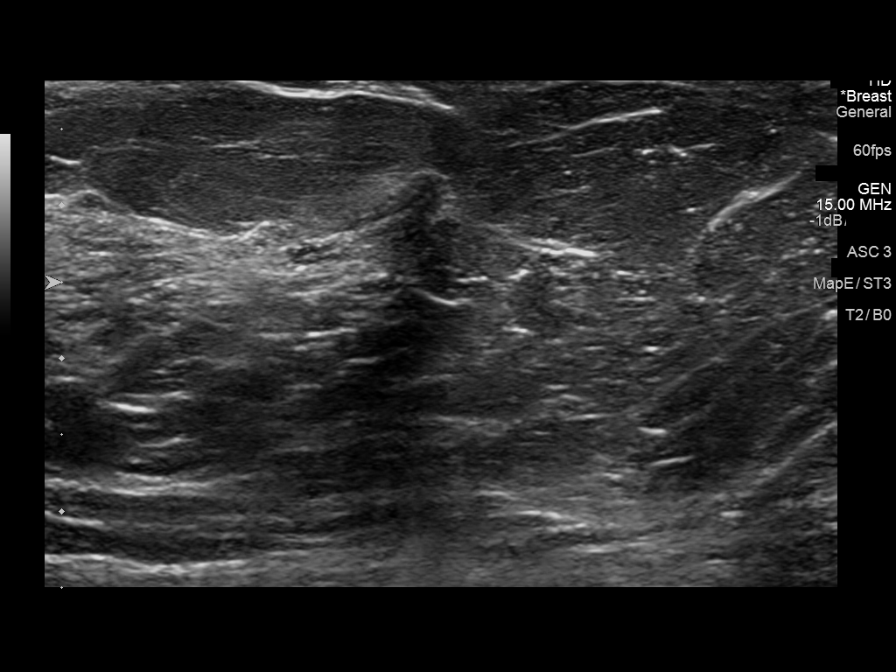
[im 6/9]
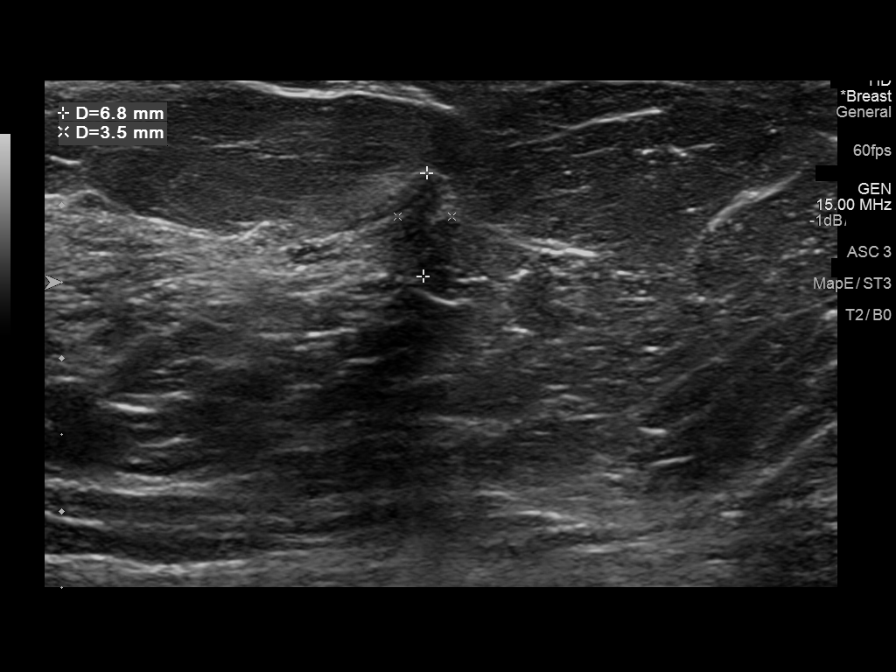
[im 7/9]
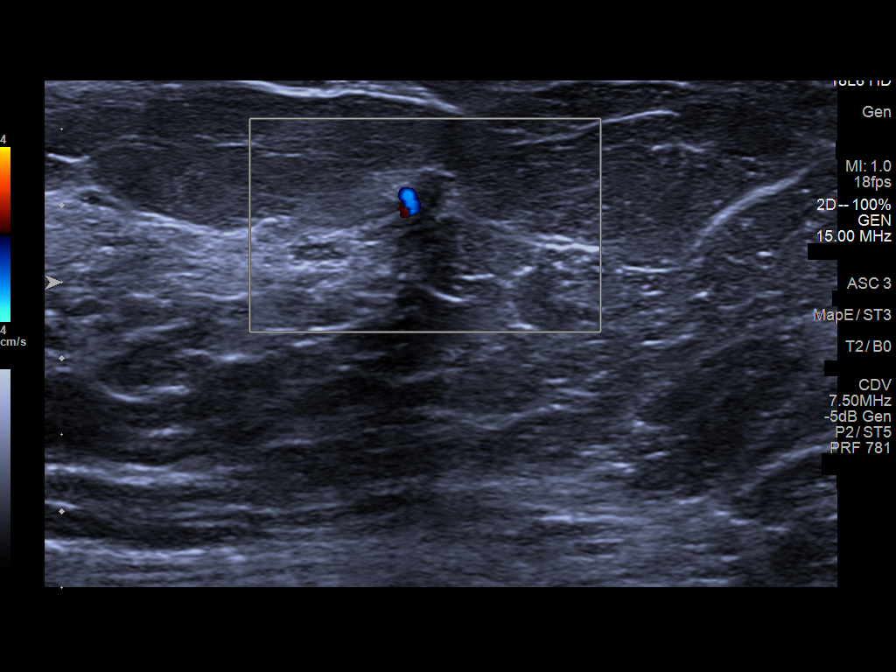
[im 8/9]
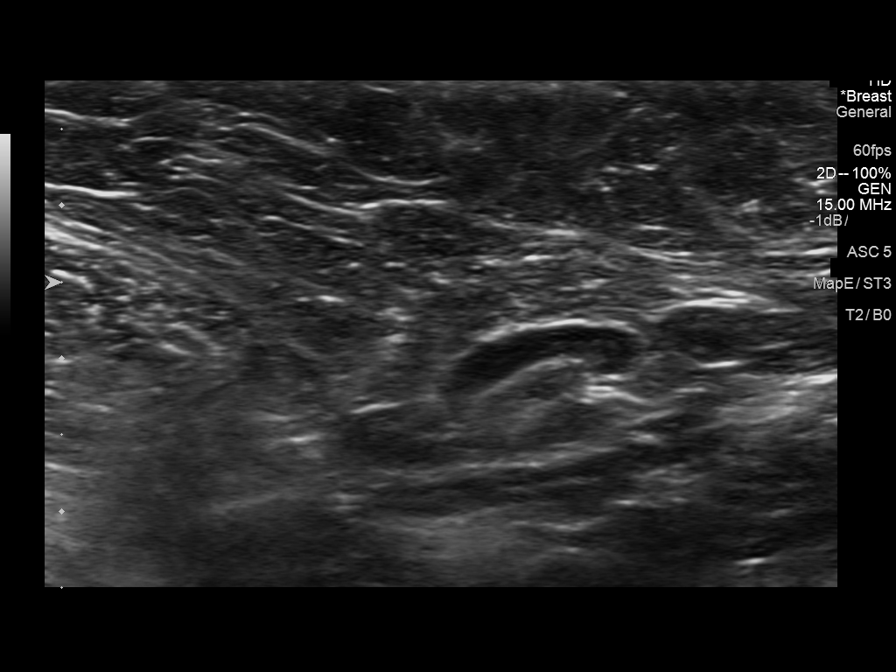
[im 9/9]
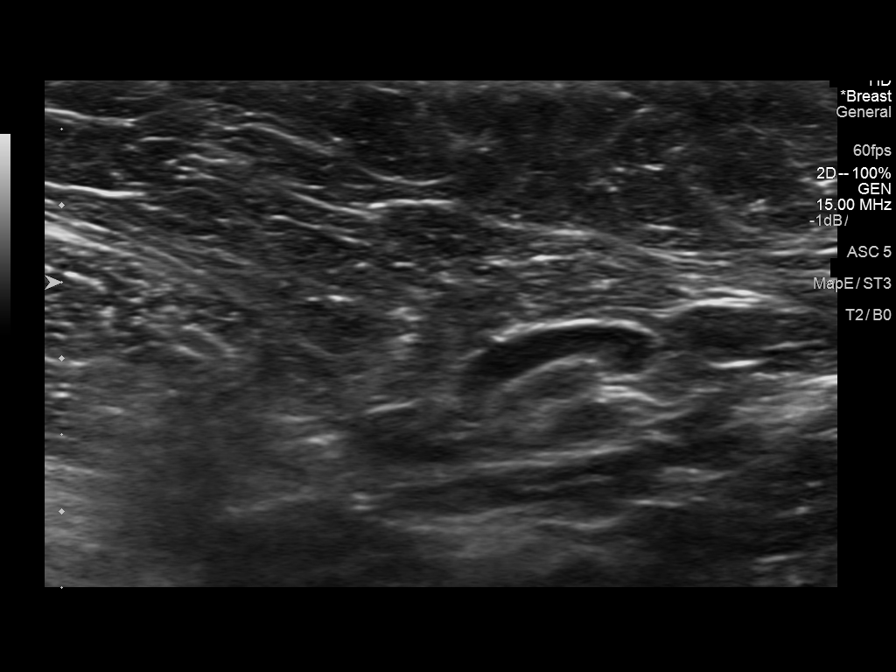

[9 of 9 positions shown; findings below may reference images not displayed]

ACR Breast Density Category b: There are scattered areas of
fibroglandular density.
FINDINGS: The medial left breast is unchanged in the interval. There is
distortion in the lateral inferior left breast which has worsened.
The finding on the MLO view correlates with the previously
identified asymmetry.

Mammographic images were processed with CAD.

On physical exam, no suspicious lumps.

Targeted ultrasound is performed, showing a mass and distortion in
the left breast at 4 o'clock, 4 cm from the nipple, thought to
correlate with the mammographic finding measuring 7 x 4 x 6 mm. The
mass is irregular and taller than wide. No left axillary adenopathy.
IMPRESSION: Mammographic distortion in the lateral inferior left breast thought
to correlate with a region of distortion and a small associated mass
seen on ultrasound.

RECOMMENDATION:
Recommend ultrasound guided biopsy of the left breast mass and
distortion. Recommend clip placement with follow-up mammography to
ensure the mammographic and sonographic findings correlate. If
breast cancer is found with biopsy, consider breast MRI to assess
extent of disease which may be underestimated sonographically.

I have discussed the findings and recommendations with the patient.
Results were also provided in writing at the conclusion of the
visit. If applicable, a reminder letter will be sent to the patient
regarding the next appointment.

BI-RADS CATEGORY  4: Suspicious.

## 2018-02-26 ENCOUNTER — Other Ambulatory Visit (HOSPITAL_COMMUNITY): Payer: Self-pay | Admitting: *Deleted

## 2018-02-26 MED ORDER — ANASTROZOLE 1 MG PO TABS: 1.0000 mg | ORAL_TABLET | Freq: Every day | ORAL | 1 refills | Status: DC

## 2018-02-26 NOTE — Telephone Encounter (Signed)
After chart review of Dr. Tomie China last office note, Anastrozole refilled.

## 2018-05-07 ENCOUNTER — Other Ambulatory Visit (HOSPITAL_COMMUNITY): Payer: Medicare Other

## 2018-05-08 ENCOUNTER — Inpatient Hospital Stay (HOSPITAL_COMMUNITY): Payer: Medicare Other | Attending: Hematology

## 2018-05-08 DIAGNOSIS — N189 Chronic kidney disease, unspecified: Secondary | ICD-10-CM | POA: Insufficient documentation

## 2018-05-08 DIAGNOSIS — Z7984 Long term (current) use of oral hypoglycemic drugs: Secondary | ICD-10-CM | POA: Insufficient documentation

## 2018-05-08 DIAGNOSIS — F17210 Nicotine dependence, cigarettes, uncomplicated: Secondary | ICD-10-CM | POA: Diagnosis not present

## 2018-05-08 DIAGNOSIS — Z79899 Other long term (current) drug therapy: Secondary | ICD-10-CM | POA: Diagnosis not present

## 2018-05-08 DIAGNOSIS — R110 Nausea: Secondary | ICD-10-CM | POA: Diagnosis not present

## 2018-05-08 DIAGNOSIS — R42 Dizziness and giddiness: Secondary | ICD-10-CM | POA: Diagnosis not present

## 2018-05-08 DIAGNOSIS — K5900 Constipation, unspecified: Secondary | ICD-10-CM | POA: Insufficient documentation

## 2018-05-08 DIAGNOSIS — Z7982 Long term (current) use of aspirin: Secondary | ICD-10-CM | POA: Insufficient documentation

## 2018-05-08 DIAGNOSIS — E7800 Pure hypercholesterolemia, unspecified: Secondary | ICD-10-CM | POA: Insufficient documentation

## 2018-05-08 DIAGNOSIS — L299 Pruritus, unspecified: Secondary | ICD-10-CM | POA: Diagnosis not present

## 2018-05-08 DIAGNOSIS — R232 Flushing: Secondary | ICD-10-CM | POA: Diagnosis not present

## 2018-05-08 DIAGNOSIS — R200 Anesthesia of skin: Secondary | ICD-10-CM | POA: Diagnosis not present

## 2018-05-08 DIAGNOSIS — C50412 Malignant neoplasm of upper-outer quadrant of left female breast: Secondary | ICD-10-CM

## 2018-05-08 DIAGNOSIS — I129 Hypertensive chronic kidney disease with stage 1 through stage 4 chronic kidney disease, or unspecified chronic kidney disease: Secondary | ICD-10-CM | POA: Insufficient documentation

## 2018-05-08 DIAGNOSIS — K219 Gastro-esophageal reflux disease without esophagitis: Secondary | ICD-10-CM | POA: Diagnosis not present

## 2018-05-08 DIAGNOSIS — J449 Chronic obstructive pulmonary disease, unspecified: Secondary | ICD-10-CM | POA: Diagnosis not present

## 2018-05-08 DIAGNOSIS — Z79811 Long term (current) use of aromatase inhibitors: Secondary | ICD-10-CM | POA: Insufficient documentation

## 2018-05-08 DIAGNOSIS — R531 Weakness: Secondary | ICD-10-CM | POA: Diagnosis not present

## 2018-05-08 DIAGNOSIS — Z9013 Acquired absence of bilateral breasts and nipples: Secondary | ICD-10-CM | POA: Diagnosis not present

## 2018-05-08 DIAGNOSIS — E1122 Type 2 diabetes mellitus with diabetic chronic kidney disease: Secondary | ICD-10-CM | POA: Insufficient documentation

## 2018-05-08 DIAGNOSIS — Z170 Estrogen receptor positive status [ER+]: Secondary | ICD-10-CM | POA: Diagnosis not present

## 2018-05-08 DIAGNOSIS — R5383 Other fatigue: Secondary | ICD-10-CM | POA: Insufficient documentation

## 2018-05-08 LAB — CBC
HCT: 37.6 % (ref 36.0–46.0)
Hemoglobin: 12 g/dL (ref 12.0–15.0)
MCH: 28.5 pg (ref 26.0–34.0)
MCHC: 31.9 g/dL (ref 30.0–36.0)
MCV: 89.3 fL (ref 80.0–100.0)
Platelets: 232 10*3/uL (ref 150–400)
RBC: 4.21 MIL/uL (ref 3.87–5.11)
RDW: 12.3 % (ref 11.5–15.5)
WBC: 6.2 10*3/uL (ref 4.0–10.5)
nRBC: 0 % (ref 0.0–0.2)

## 2018-05-08 LAB — COMPREHENSIVE METABOLIC PANEL
ALT: 21 U/L (ref 0–44)
AST: 25 U/L (ref 15–41)
Albumin: 3.9 g/dL (ref 3.5–5.0)
Alkaline Phosphatase: 85 U/L (ref 38–126)
Anion gap: 9 (ref 5–15)
BUN: 9 mg/dL (ref 8–23)
CO2: 26 mmol/L (ref 22–32)
Calcium: 9.5 mg/dL (ref 8.9–10.3)
Chloride: 105 mmol/L (ref 98–111)
Creatinine, Ser: 0.99 mg/dL (ref 0.44–1.00)
GFR calc Af Amer: >60 mL/min (ref >60)
GFR calc non Af Amer: 57 mL/min — ABNORMAL LOW (ref >60)
Glucose, Bld: 237 mg/dL — ABNORMAL HIGH (ref 70–99)
Potassium: 3.6 mmol/L (ref 3.5–5.1)
Sodium: 140 mmol/L (ref 135–145)
Total Bilirubin: 0.4 mg/dL (ref 0.3–1.2)
Total Protein: 7.7 g/dL (ref 6.5–8.1)

## 2018-05-09 LAB — CANCER ANTIGEN 15-3: CA 15-3: 6.5 U/mL (ref 0.0–25.0)

## 2018-05-12 ENCOUNTER — Encounter (HOSPITAL_COMMUNITY): Payer: Self-pay | Admitting: Hematology

## 2018-05-12 ENCOUNTER — Other Ambulatory Visit: Payer: Self-pay

## 2018-05-12 ENCOUNTER — Inpatient Hospital Stay (HOSPITAL_BASED_OUTPATIENT_CLINIC_OR_DEPARTMENT_OTHER): Payer: Medicare Other | Admitting: Hematology

## 2018-05-12 DIAGNOSIS — R200 Anesthesia of skin: Secondary | ICD-10-CM

## 2018-05-12 DIAGNOSIS — E1122 Type 2 diabetes mellitus with diabetic chronic kidney disease: Secondary | ICD-10-CM

## 2018-05-12 DIAGNOSIS — Z7984 Long term (current) use of oral hypoglycemic drugs: Secondary | ICD-10-CM

## 2018-05-12 DIAGNOSIS — R5383 Other fatigue: Secondary | ICD-10-CM

## 2018-05-12 DIAGNOSIS — R42 Dizziness and giddiness: Secondary | ICD-10-CM

## 2018-05-12 DIAGNOSIS — J449 Chronic obstructive pulmonary disease, unspecified: Secondary | ICD-10-CM

## 2018-05-12 DIAGNOSIS — L299 Pruritus, unspecified: Secondary | ICD-10-CM

## 2018-05-12 DIAGNOSIS — Z7982 Long term (current) use of aspirin: Secondary | ICD-10-CM

## 2018-05-12 DIAGNOSIS — C50412 Malignant neoplasm of upper-outer quadrant of left female breast: Secondary | ICD-10-CM | POA: Diagnosis not present

## 2018-05-12 DIAGNOSIS — Z9013 Acquired absence of bilateral breasts and nipples: Secondary | ICD-10-CM

## 2018-05-12 DIAGNOSIS — R110 Nausea: Secondary | ICD-10-CM

## 2018-05-12 DIAGNOSIS — E7800 Pure hypercholesterolemia, unspecified: Secondary | ICD-10-CM

## 2018-05-12 DIAGNOSIS — Z79899 Other long term (current) drug therapy: Secondary | ICD-10-CM

## 2018-05-12 DIAGNOSIS — Z170 Estrogen receptor positive status [ER+]: Secondary | ICD-10-CM | POA: Diagnosis not present

## 2018-05-12 DIAGNOSIS — R531 Weakness: Secondary | ICD-10-CM

## 2018-05-12 DIAGNOSIS — Z79811 Long term (current) use of aromatase inhibitors: Secondary | ICD-10-CM

## 2018-05-12 DIAGNOSIS — K219 Gastro-esophageal reflux disease without esophagitis: Secondary | ICD-10-CM

## 2018-05-12 DIAGNOSIS — K5900 Constipation, unspecified: Secondary | ICD-10-CM

## 2018-05-12 DIAGNOSIS — R232 Flushing: Secondary | ICD-10-CM

## 2018-05-12 DIAGNOSIS — N189 Chronic kidney disease, unspecified: Secondary | ICD-10-CM

## 2018-05-12 DIAGNOSIS — I129 Hypertensive chronic kidney disease with stage 1 through stage 4 chronic kidney disease, or unspecified chronic kidney disease: Secondary | ICD-10-CM

## 2018-05-12 DIAGNOSIS — F17210 Nicotine dependence, cigarettes, uncomplicated: Secondary | ICD-10-CM

## 2018-05-12 NOTE — Assessment & Plan Note (Signed)
1.  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  She had low score on Oncotype DX.  Hence she was placed on anastrozole. - Remote history of right breast cancer in 2006, status post right mastectomy treated at [facility]. -She is tolerating anastrozole very well.  Today's physical examination was within normal limits.  Her tumor marker was also within normal limits along with other blood counts. - We will follow her in 6 months with repeat blood counts and physical exam.  2.  Bone density: Her last DEXA scan in July 2018 had a T score of -1.0 which was normal.  We will follow it in 2-3 years. -She was told to take calcium and vitamin D supplements.

## 2018-05-12 NOTE — Progress Notes (Signed)
Michelle Henry, [place] [address]   CLINIC:  Medical Oncology/Hematology  PCP:  Vesta Mixer [address] [place] [address] [phone]   REASON FOR VISIT: Follow-up for left breast cancer, ER+/PR+/HER2-  CURRENT THERAPY: anastrozole daily  BRIEF ONCOLOGIC HISTORY:    Breast cancer, left ([place])   07/30/2016 Mammogram    Left diagnostic mammogram: Mammographic distortion in the lateral inferior left breast thought to correlate with a region of distortion and a small associated mass seen on ultrasound.    07/30/2016 Imaging    Left breast US: Targeted ultrasound is performed, showing a mass and distortion in the left breast at 4 o'clock, 4 cm from the nipple, thought to correlate with the mammographic finding measuring 7 x 4 x 6 mm. The mass is irregular and taller than wide. No left axillary adenopathy.    08/13/2016 Pathology Results    Left breast core needle biopsy: Breast, left, needle core biopsy, 4:00 - INVASIVE DUCTAL CARCINOMA. - DUCTAL CARCINOMA IN SITU. - SEE COMMENT. Microscopic Comment The invasive carcinoma is grade 1 and is highlighted by a lack of myoepithelial cells as demonstrated by p63, calponin, and smooth muscle myosin stains.  Hormone Profile: ER positive, PR positive, HER2 negative    08/13/2016 Initial Diagnosis    Breast cancer, left ([place])    09/03/2016 Imaging    MRI breast: IMPRESSION: 1. Architectural distortion involving the lower outer quadrant of the left breast. The tissue marker clip placed at the time of ultrasound biopsy is within the area of distortion. 2. 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 There is no mammographic correlate. 3. No pathologic lymphadenopathy.    12/06/2016 Surgery    Left modified radical mastectomy by Dr. Arnoldo Morale. Her surgical path demonstrated  invasive ductal carcinoma with extracellular mucin, grade 1, spanning 2 cm. DCIS grade 1. All margins negative. 0/8 lymph nodes resected were positive for malignancy. Final pathology staging [id number] (Stage IB).     01/14/2017 Oncotype testing    Oncotype Dx recurrence score of 14.      CANCER STAGING: Cancer Staging Breast cancer, left ([place]) Staging form: Breast, AJCC 8th Edition - Pathologic: Stage IA (pT1c, pN0, cM0, G1, ER: Positive, PR: Positive, HER2: Negative) - Signed by Twana First, MD on 01/01/2017    INTERVAL HISTORY:  Michelle Henry 72 y.o. female returns for routine follow-up for left breast cancer. She is tolerating her anastrozole well. She is having fatigue, constipation, and weakness. She reports her hot flashes have decreased and she rarely has them now. Her numbness to her hands and feet are stable. She reports her appetite and energy level stay at 25% however she is maintaining her weight well. She denies any new pains or lumps present. Denies any bleeding or easy bruising. Denies any skin rashes or mouth sores.    REVIEW OF SYSTEMS:  Review of Systems  Constitutional: Positive for fatigue.  HENT:   Positive for trouble swallowing (liquids occasionally).   Respiratory: Positive for shortness of breath.   Gastrointestinal: Positive for constipation and nausea.  Skin: Positive for itching.  Neurological: Positive for dizziness, extremity weakness and numbness.  All other systems reviewed and are negative.    PAST MEDICAL/SURGICAL HISTORY:  Past Medical History:  Diagnosis Date  . Blood transfusion   . Cancer ([place])   . Chronic kidney disease   .  COPD (chronic obstructive pulmonary disease) ([place])   . Diabetes mellitus   . Dysphagia   . GERD (gastroesophageal reflux disease)   . High cholesterol   . Hypertension   . Shortness of breath    Past Surgical History:  Procedure Laterality Date  . BREAST SURGERY    . COLONOSCOPY N/A 03/28/2017   Procedure:  COLONOSCOPY;  Surgeon: Rogene Houston, MD;  Location: [facility];  Service: Endoscopy;  Laterality: N/A;  1200 - moved to 9/5 @ 10:30  . ESOPHAGOGASTRODUODENOSCOPY (EGD) WITH ESOPHAGEAL DILATION  11/2012   [facility]  . MASTECTOMY  right side  . MASTECTOMY MODIFIED RADICAL Left 12/06/2016   Procedure: MASTECTOMY MODIFIED RADICAL;  Surgeon: [name], MD;  Location: [facility] ORS;  Service: General;  Laterality: Left;  . TONSILLECTOMY       SOCIAL HISTORY:  Social History   Socioeconomic History  . Marital status: Single    Spouse name: Not on file  . Number of children: Not on file  . Years of education: Not on file  . Highest education level: Not on file  Occupational History  . Not on file  Social Needs  . Financial resource strain: Not on file  . Food insecurity:    Worry: Not on file    Inability: Not on file  . Transportation needs:    Medical: Not on file    Non-medical: Not on file  Tobacco Use  . Smoking status: Current Every Day Smoker    Packs/day: 0.75    Years: 53.00    Pack years: 39.75    Types: Cigarettes  . Smokeless tobacco: Never Used  Substance and Sexual Activity  . Alcohol use: No  . Drug use: No  . Sexual activity: Yes  Lifestyle  . Physical activity:    Days per week: Not on file    Minutes per session: Not on file  . Stress: Not on file  Relationships  . Social connections:    Talks on phone: Not on file    Gets together: Not on file    Attends religious service: Not on file    Active member of club or organization: Not on file    Attends meetings of clubs or organizations: Not on file    Relationship status: Not on file  . Intimate partner violence:    Fear of current or ex partner: Not on file    Emotionally abused: Not on file    Physically abused: Not on file    Forced sexual activity: Not on file  Other Topics Concern  . Not on file  Social History Narrative  . Not on file    FAMILY HISTORY:  Family History  Problem Relation Age  of Onset  . Hypertension Mother   . Kidney disease Mother   . Emphysema Father   . Diabetes Brother   . COPD Brother   . Heart attack Brother     CURRENT MEDICATIONS:  Outpatient Encounter Medications as of 05/12/2018  Medication Sig  . albuterol (PROVENTIL HFA;VENTOLIN HFA) 108 (90 BASE) MCG/ACT inhaler Inhale 1-2 puffs into the lungs every 6 (six) hours as needed for wheezing or shortness of breath.  . anastrozole (ARIMIDEX) 1 MG tablet Take 1 tablet (1 mg total) by mouth daily.  Marland Kitchen aspirin EC 81 MG tablet Take 81 mg by mouth daily after breakfast.   . beclomethasone (QVAR) 40 MCG/ACT inhaler Inhale 1 puff into the lungs daily.   . benazepril (LOTENSIN) 20 MG  tablet Take 20 mg by mouth daily after breakfast.   . calcium-vitamin D (OSCAL WITH D) 500-200 MG-UNIT tablet Take 2 tablets by mouth daily with breakfast.  . diphenhydrAMINE (BENADRYL) 25 MG tablet Take 25 mg by mouth every 6 (six) hours as needed (for sinus/allergies.).  Marland Kitchen docusate sodium (COLACE) 100 MG capsule Take 200 mg by mouth daily after breakfast.  . FLOVENT HFA 220 MCG/ACT inhaler   . Fluticasone-Salmeterol (ADVAIR) 250-50 MCG/DOSE AEPB   . hydrOXYzine (ATARAX/VISTARIL) 25 MG tablet Take 25 mg by mouth every 8 (eight) hours as needed for itching.  . Iron-Vitamin C (VITRON-C PO) Take 1 tablet by mouth daily after breakfast.  . lovastatin (MEVACOR) 20 MG tablet Take 20 mg by mouth at bedtime.  . metFORMIN (GLUCOPHAGE) 500 MG tablet Take 500 mg by mouth daily after breakfast.   . naproxen (NAPROSYN) 500 MG tablet Take 500 mg by mouth every 12 (twelve) hours as needed (for pain.).   Marland Kitchen omeprazole (PRILOSEC) 20 MG capsule Take 20 mg by mouth daily before breakfast.   . ONE TOUCH ULTRA TEST test strip   . SPIRIVA RESPIMAT 2.5 MCG/ACT AERS   . [DISCONTINUED] fluticasone (FLONASE) 50 MCG/ACT nasal spray    Facility-Administered Encounter Medications as of 05/12/2018  Medication  . gadobenate dimeglumine (MULTIHANCE)  injection 14 mL    ALLERGIES:  No Known Allergies   PHYSICAL EXAM:  ECOG Performance status: 1  VITAL SIGNS:BP: 166/55, P:71, R:18, T:98.1, SATS: 99% Weight: 152  Physical Exam  Constitutional: She is oriented to person, place, and time. She appears well-developed and well-nourished.  Musculoskeletal: Normal range of motion.  Neurological: She is alert and oriented to person, place, and time.  Skin: Skin is warm and dry.  Psychiatric: She has a normal mood and affect. Her behavior is normal. Judgment and thought content normal.  Bilateral mastectomy sites are within normal limits with no palpable suspicious nodules.  No palpable adenopathy.  LABORATORY DATA:  I have reviewed the labs as listed.  CBC    Component Value Date/Time   WBC 6.2 05/08/2018 1047   RBC 4.21 05/08/2018 1047   HGB 12.0 05/08/2018 1047   HCT 37.6 05/08/2018 1047   PLT 232 05/08/2018 1047   MCV 89.3 05/08/2018 1047   MCH 28.5 05/08/2018 1047   MCHC 31.9 05/08/2018 1047   RDW 12.3 05/08/2018 1047   LYMPHSABS 3.0 10/31/2017 1515   MONOABS 0.5 10/31/2017 1515   EOSABS 0.2 10/31/2017 1515   BASOSABS 0.0 10/31/2017 1515   CMP Latest Ref Rng & Units 05/08/2018 10/31/2017 05/02/2017  Glucose 70 - 99 mg/dL 237(H) 166(H) 127(H)  BUN 8 - 23 mg/dL '9 11 11  ' Creatinine 0.44 - 1.00 mg/dL 0.99 0.92 1.12(H)  Sodium 135 - 145 mmol/L 140 142 136  Potassium 3.5 - 5.1 mmol/L 3.6 3.3(L) 4.3  Chloride 98 - 111 mmol/L 105 107 101  CO2 22 - 32 mmol/L '26 23 24  ' Calcium 8.9 - 10.3 mg/dL 9.5 9.3 9.3  Total Protein 6.5 - 8.1 g/dL 7.7 7.2 7.6  Total Bilirubin 0.3 - 1.2 mg/dL 0.4 0.3 0.4  Alkaline Phos 38 - 126 U/L 85 83 83  AST 15 - 41 U/L '25 20 22  ' ALT 0 - 44 U/L 21 12(L) 15         ASSESSMENT & PLAN:   Breast cancer, left ([facility]) 1.  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  She had low score on Oncotype DX.  Hence she was  placed on anastrozole. - Remote history of right breast cancer in 2006, status post right mastectomy treated at [facility]. -She is tolerating anastrozole very well.  Today's physical examination was within normal limits.  Her tumor marker was also within normal limits along with other blood counts. - We will follow her in 6 months with repeat blood counts and physical exam.  2.  Bone density: Her last DEXA scan in July 2018 had a T score of -1.0 which was normal.  We will follow it in 2-3 years. -She was told to take calcium and vitamin D supplements.      Orders placed this encounter:  Orders Placed This Encounter  Procedures  . CBC with Differential/Platelet  . Comprehensive metabolic panel  . Cancer antigen 15-3      Derek Jack, MD [place] [phone]

## 2018-05-12 NOTE — Patient Instructions (Signed)
[facility] at [facility] Discharge Instructions  Follow up in 6 months with labs    Thank you for choosing [facility] at [facility] to provide your oncology and hematology care.  To afford each patient quality time with our provider, please arrive at least 15 minutes before your scheduled appointment time.   If you have a lab appointment with the Cancer Center please come in thru the  Main Entrance and check in at the main information desk  You need to re-schedule your appointment should you arrive 10 or more minutes late.  We strive to give you quality time with our providers, and arriving late affects you and other patients whose appointments are after yours.  Also, if you no show three or more times for appointments you may be dismissed from the clinic at the providers discretion.     Again, thank you for choosing [facility].  Our hope is that these requests will decrease the amount of time that you wait before being seen by our physicians.       _____________________________________________________________  Should you have questions after your visit to [facility], please contact our office at [phone] between the hours of 8:00 a.m. and 4:30 p.m.  Voicemails left after 4:00 p.m. will not be returned until the following business day.  For prescription refill requests, have your pharmacy contact our office and allow 72 hours.    Cancer Center Support Programs:   > Cancer Support Group  2nd Tuesday of the month 1pm-2pm, Journey Room    

## 2018-05-14 ENCOUNTER — Ambulatory Visit (HOSPITAL_COMMUNITY): Payer: Medicare Other | Admitting: Hematology

## 2018-06-09 ENCOUNTER — Other Ambulatory Visit (HOSPITAL_COMMUNITY): Payer: Self-pay | Admitting: Hematology

## 2018-06-09 DIAGNOSIS — C50012 Malignant neoplasm of nipple and areola, left female breast: Secondary | ICD-10-CM

## 2018-10-08 ENCOUNTER — Other Ambulatory Visit (HOSPITAL_COMMUNITY): Payer: Self-pay | Admitting: Hematology

## 2018-10-19 ENCOUNTER — Other Ambulatory Visit: Payer: Self-pay

## 2018-10-19 ENCOUNTER — Encounter (HOSPITAL_COMMUNITY): Payer: Self-pay | Admitting: Emergency Medicine

## 2018-10-19 ENCOUNTER — Emergency Department (HOSPITAL_COMMUNITY)
Admission: EM | Admit: 2018-10-19 | Discharge: 2018-10-19 | Disposition: A | Discharge status: Home / Self Care | Payer: Medicare Other | Attending: Emergency Medicine | Admitting: Emergency Medicine

## 2018-10-19 ENCOUNTER — Emergency Department (HOSPITAL_COMMUNITY): Payer: Medicare Other

## 2018-10-19 DIAGNOSIS — Z7984 Long term (current) use of oral hypoglycemic drugs: Secondary | ICD-10-CM | POA: Insufficient documentation

## 2018-10-19 DIAGNOSIS — N189 Chronic kidney disease, unspecified: Secondary | ICD-10-CM | POA: Diagnosis not present

## 2018-10-19 DIAGNOSIS — Z7982 Long term (current) use of aspirin: Secondary | ICD-10-CM | POA: Insufficient documentation

## 2018-10-19 DIAGNOSIS — M79605 Pain in left leg: Secondary | ICD-10-CM | POA: Insufficient documentation

## 2018-10-19 DIAGNOSIS — Z79899 Other long term (current) drug therapy: Secondary | ICD-10-CM | POA: Diagnosis not present

## 2018-10-19 DIAGNOSIS — R05 Cough: Secondary | ICD-10-CM | POA: Diagnosis not present

## 2018-10-19 DIAGNOSIS — J449 Chronic obstructive pulmonary disease, unspecified: Secondary | ICD-10-CM | POA: Diagnosis not present

## 2018-10-19 DIAGNOSIS — R059 Cough, unspecified: Secondary | ICD-10-CM

## 2018-10-19 DIAGNOSIS — F17210 Nicotine dependence, cigarettes, uncomplicated: Secondary | ICD-10-CM | POA: Diagnosis not present

## 2018-10-19 DIAGNOSIS — E1122 Type 2 diabetes mellitus with diabetic chronic kidney disease: Secondary | ICD-10-CM | POA: Insufficient documentation

## 2018-10-19 DIAGNOSIS — M25552 Pain in left hip: Secondary | ICD-10-CM | POA: Diagnosis present

## 2018-10-19 DIAGNOSIS — I129 Hypertensive chronic kidney disease with stage 1 through stage 4 chronic kidney disease, or unspecified chronic kidney disease: Secondary | ICD-10-CM | POA: Insufficient documentation

## 2018-10-19 IMAGING — DX DG HIP (WITH OR WITHOUT PELVIS) 2-3V LEFT
3 series · 3 of 3 positions shown · non-contrast
Comparison: None.

CLINICAL DATA: Left hip pain after fall 3 weeks ago.

EXAM:
DG HIP (WITH OR WITHOUT PELVIS) 2-3V LEFT

[pelvis ap]
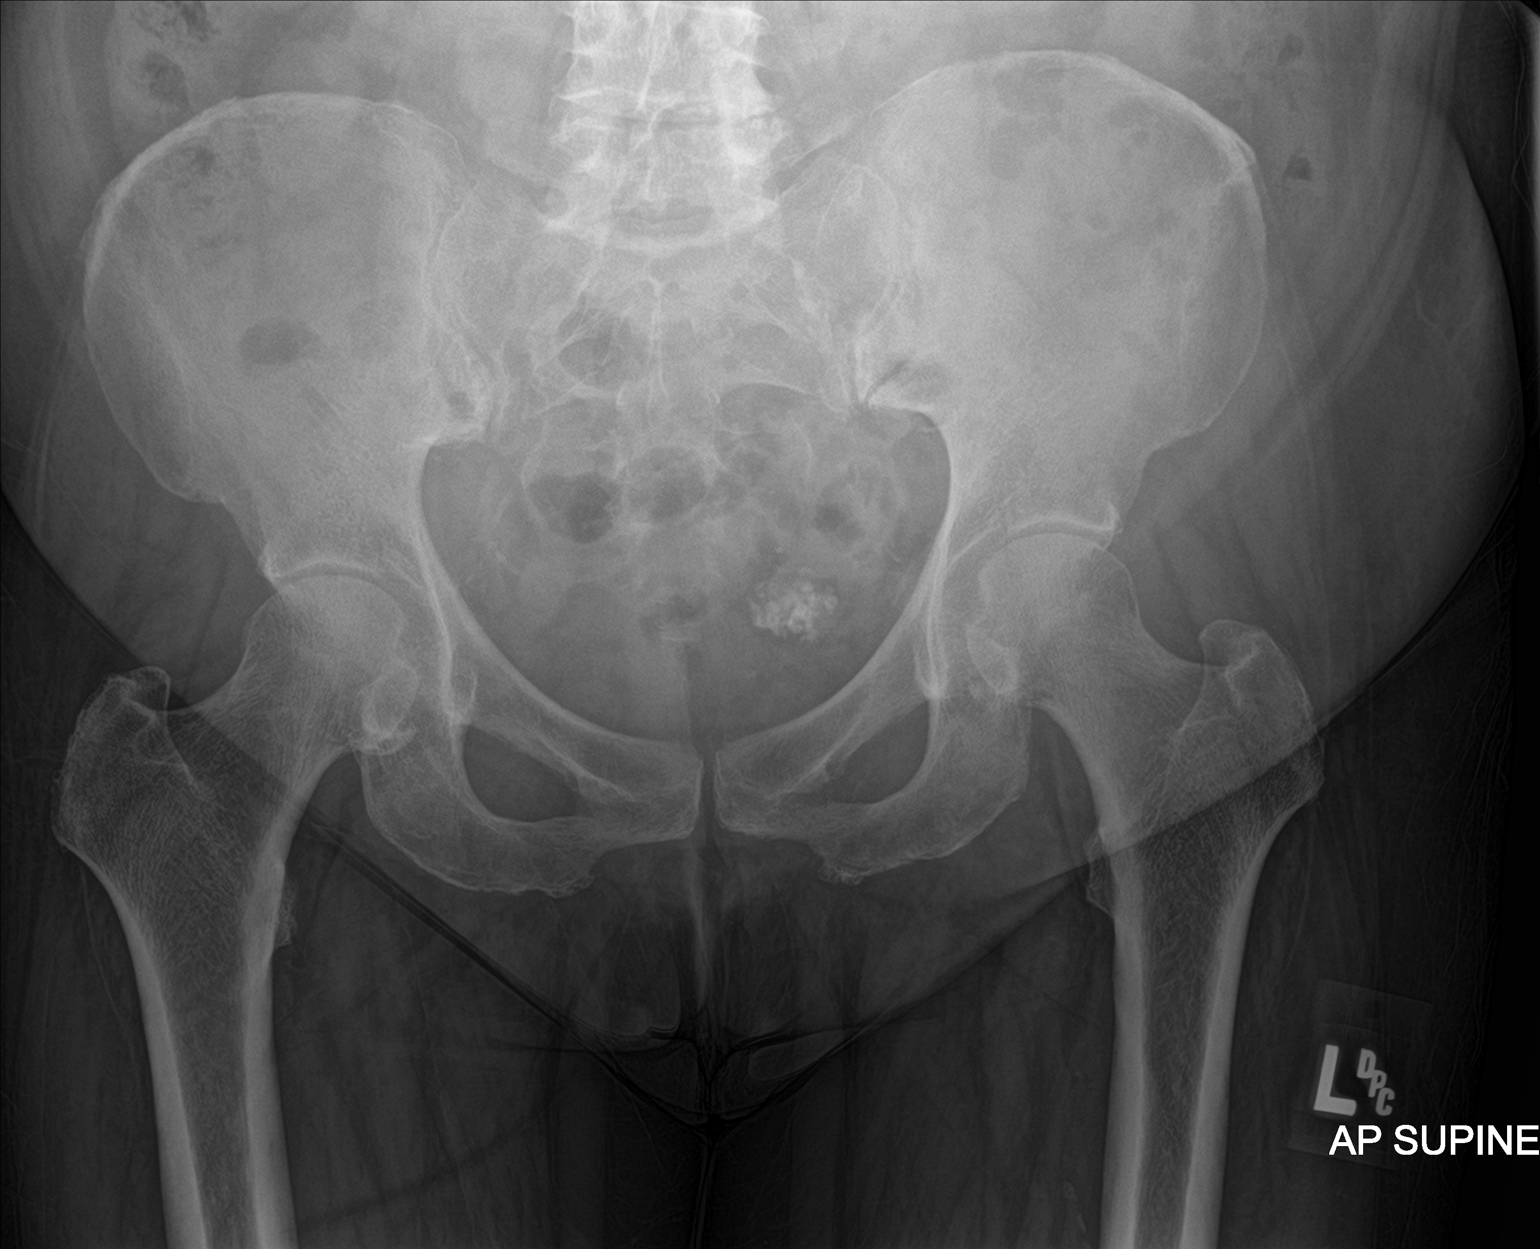

[hip ap]
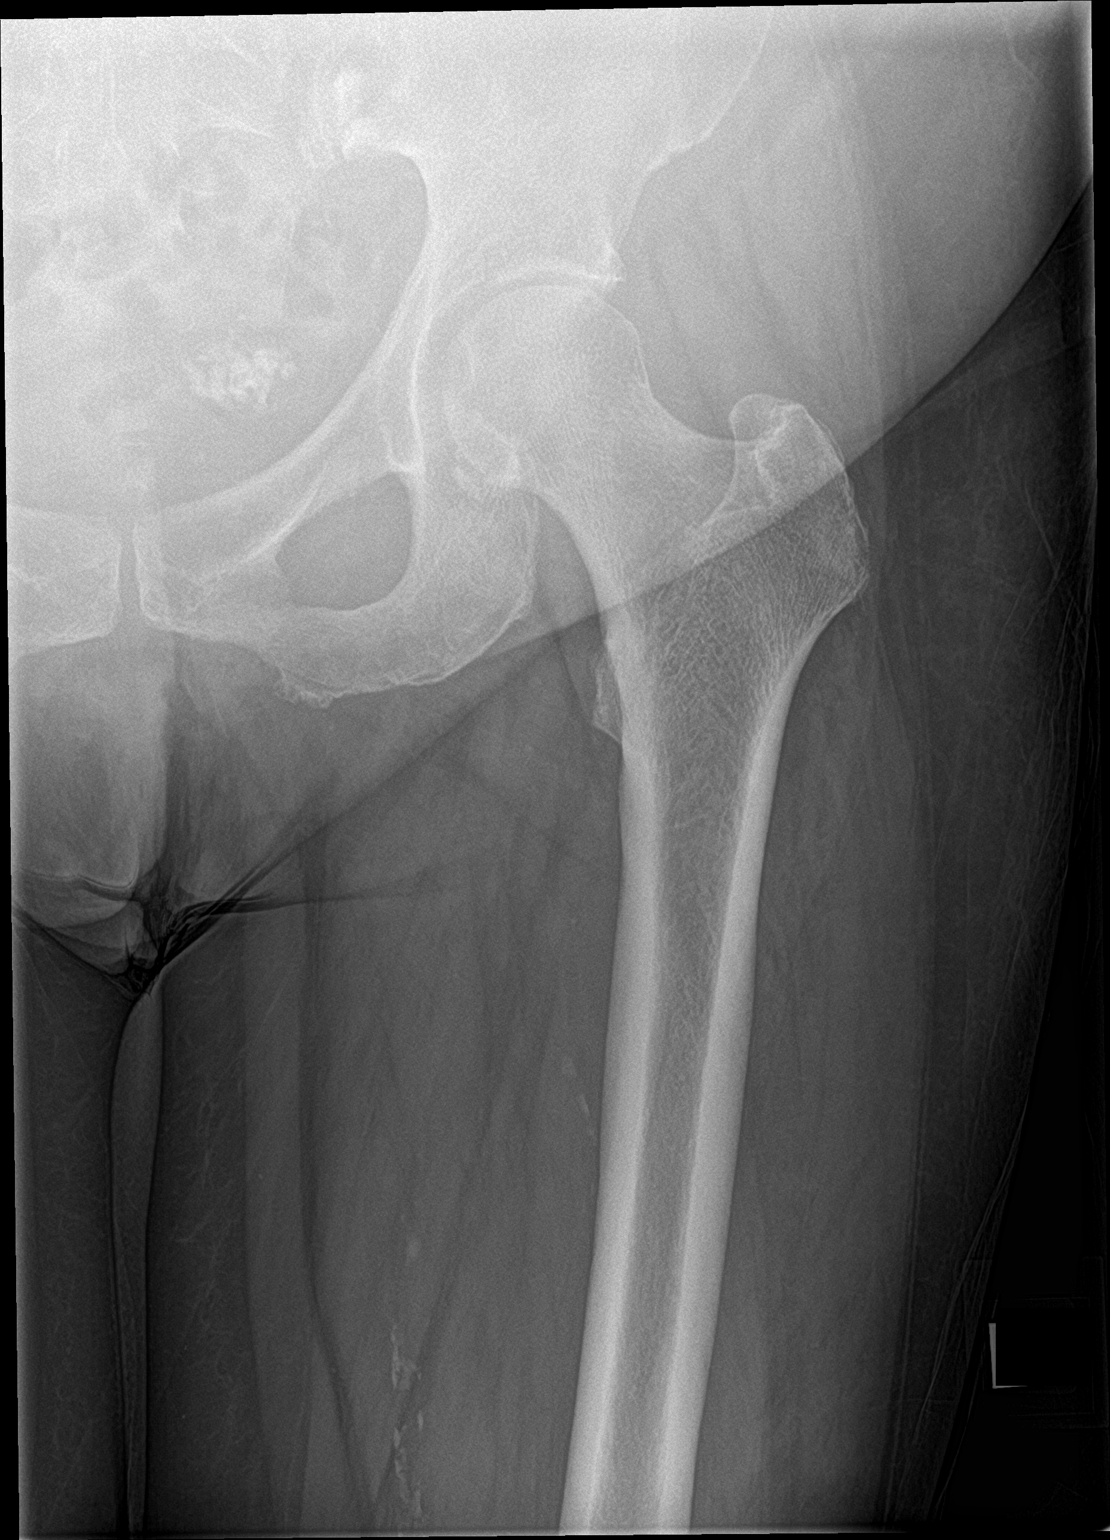

[hip lat]
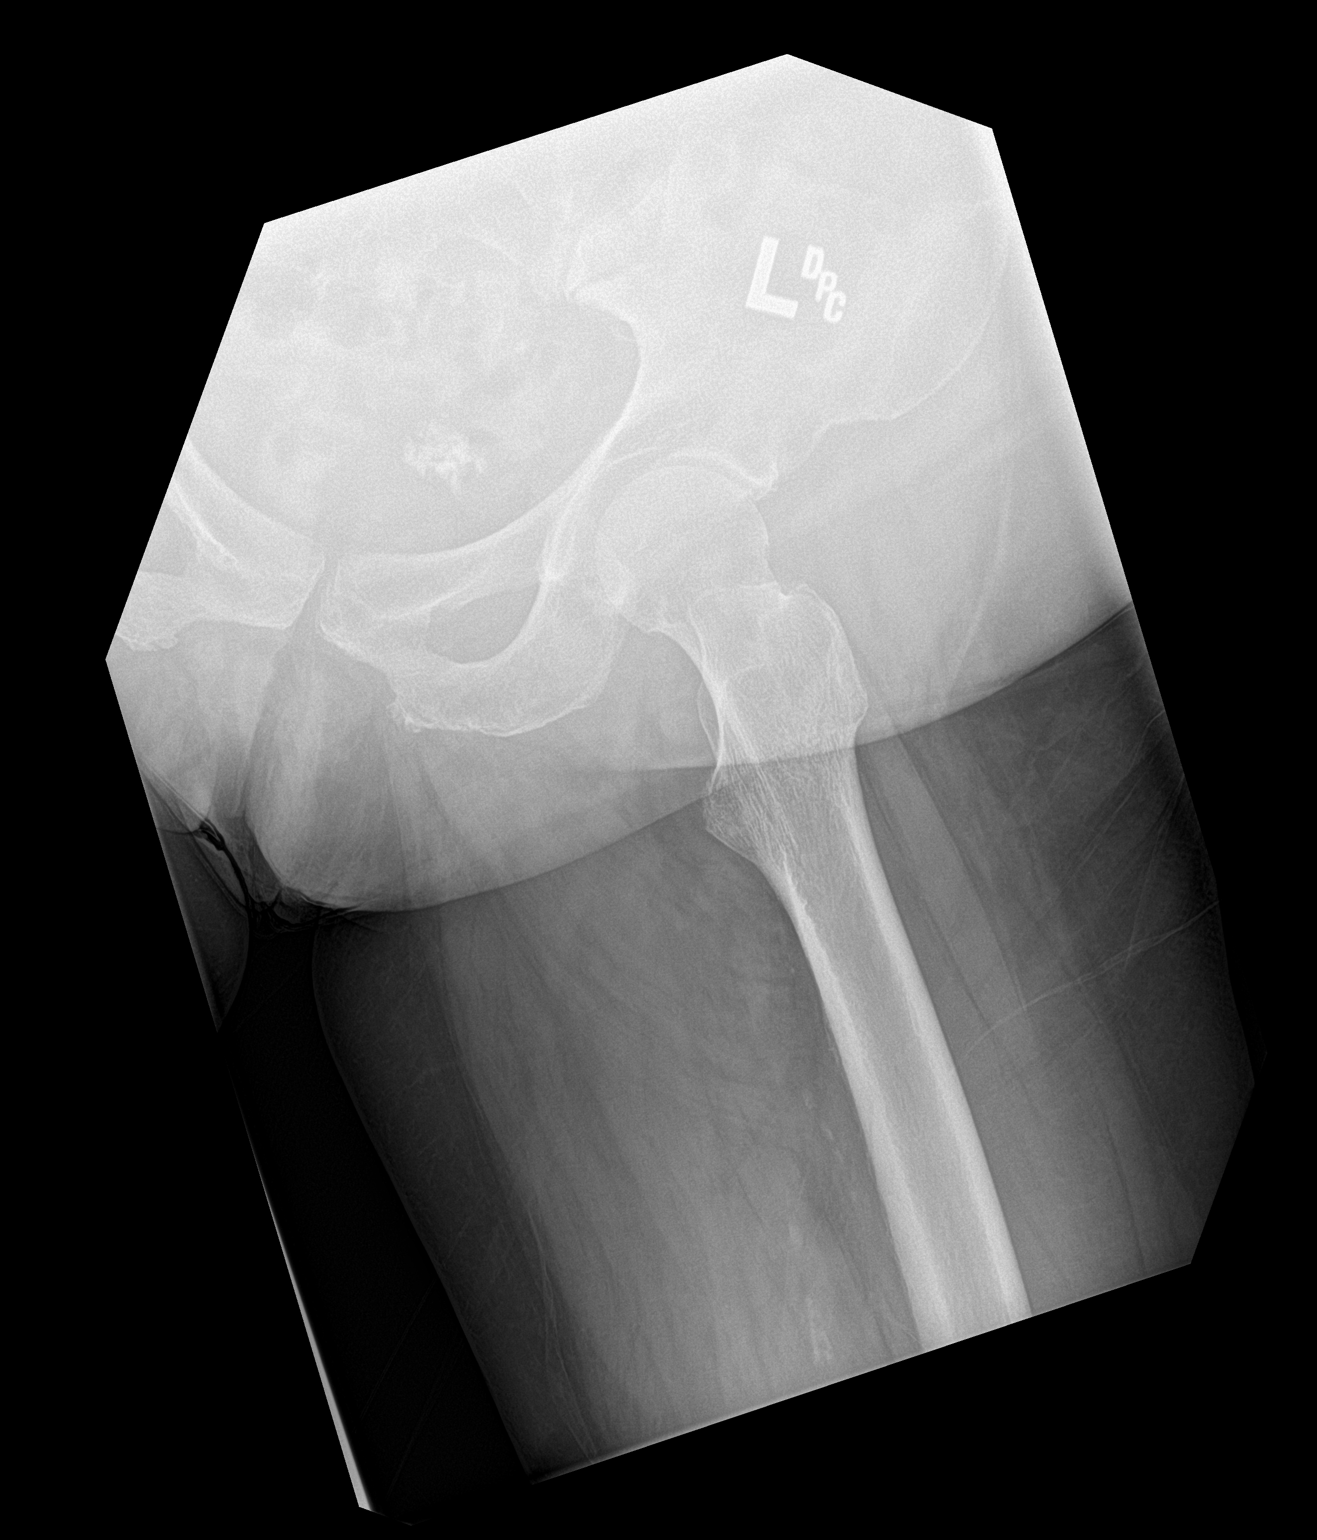

[3 of 3 positions shown; findings below may reference images not displayed]

FINDINGS: Degenerative disc disease of the included lower lumbar spine from L4
through S1. Mild facet arthropathy seen on the left at L4-5. No
pelvic diastasis or fracture. Moderate joint space narrowing of both
hips. No flattening of the femoral heads. No joint dislocation nor
susp hip fracture. No suspicious osseous lesions. Soft tissue
calcifications in the left hemipelvis likely reflect calcified
fibroids.
IMPRESSION: Degenerative joint space narrowing both hips. No acute osseous
abnormality.

Degenerative disc disease and facet arthropathy of the lower lumbar
spine.

## 2018-10-19 IMAGING — DX CHEST - 2 VIEW
2 series · 2 of 2 positions shown · non-contrast
Comparison: [DATE]

CLINICAL DATA: Cough times a few months.

EXAM:
CHEST - 2 VIEW

[chest pa]
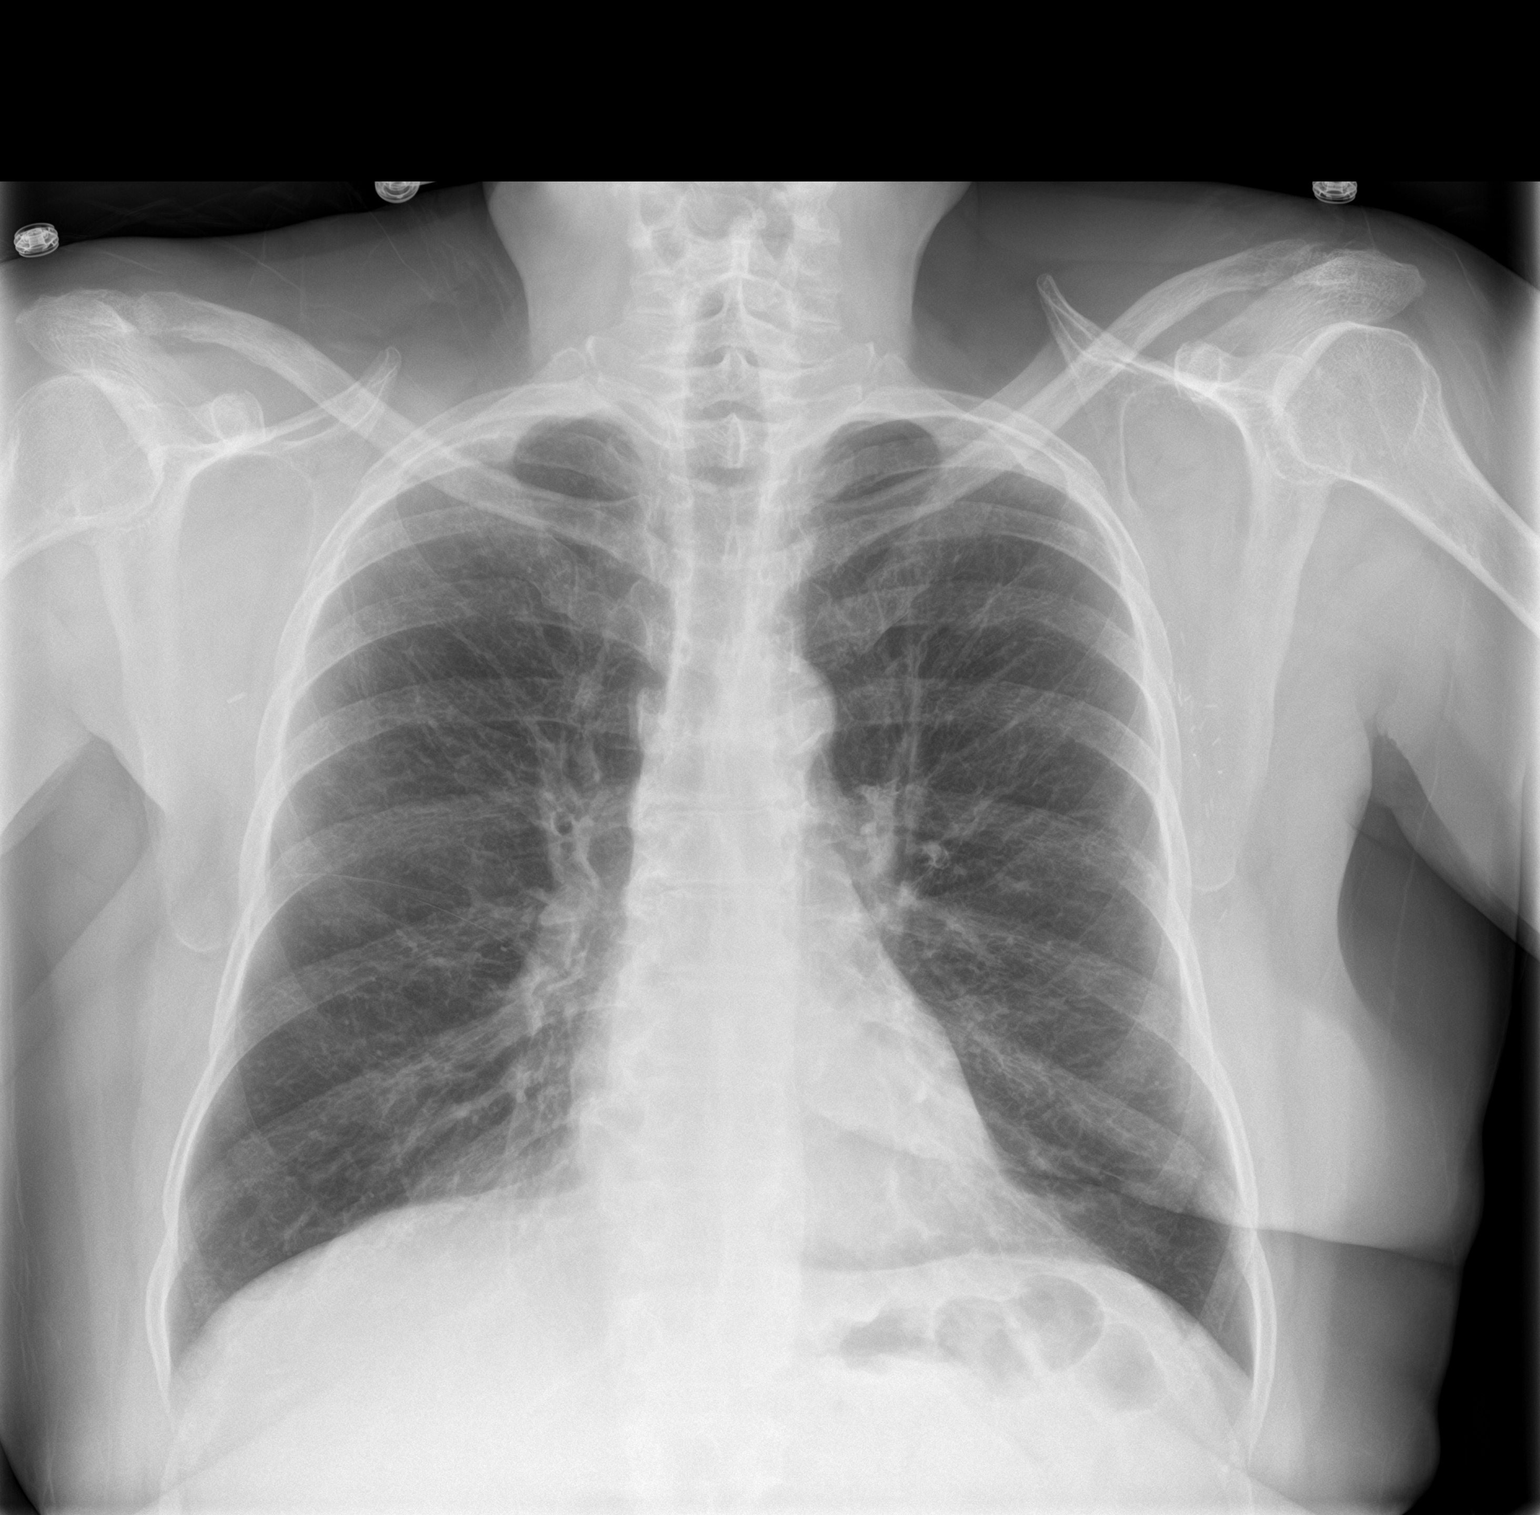

[chest lat]
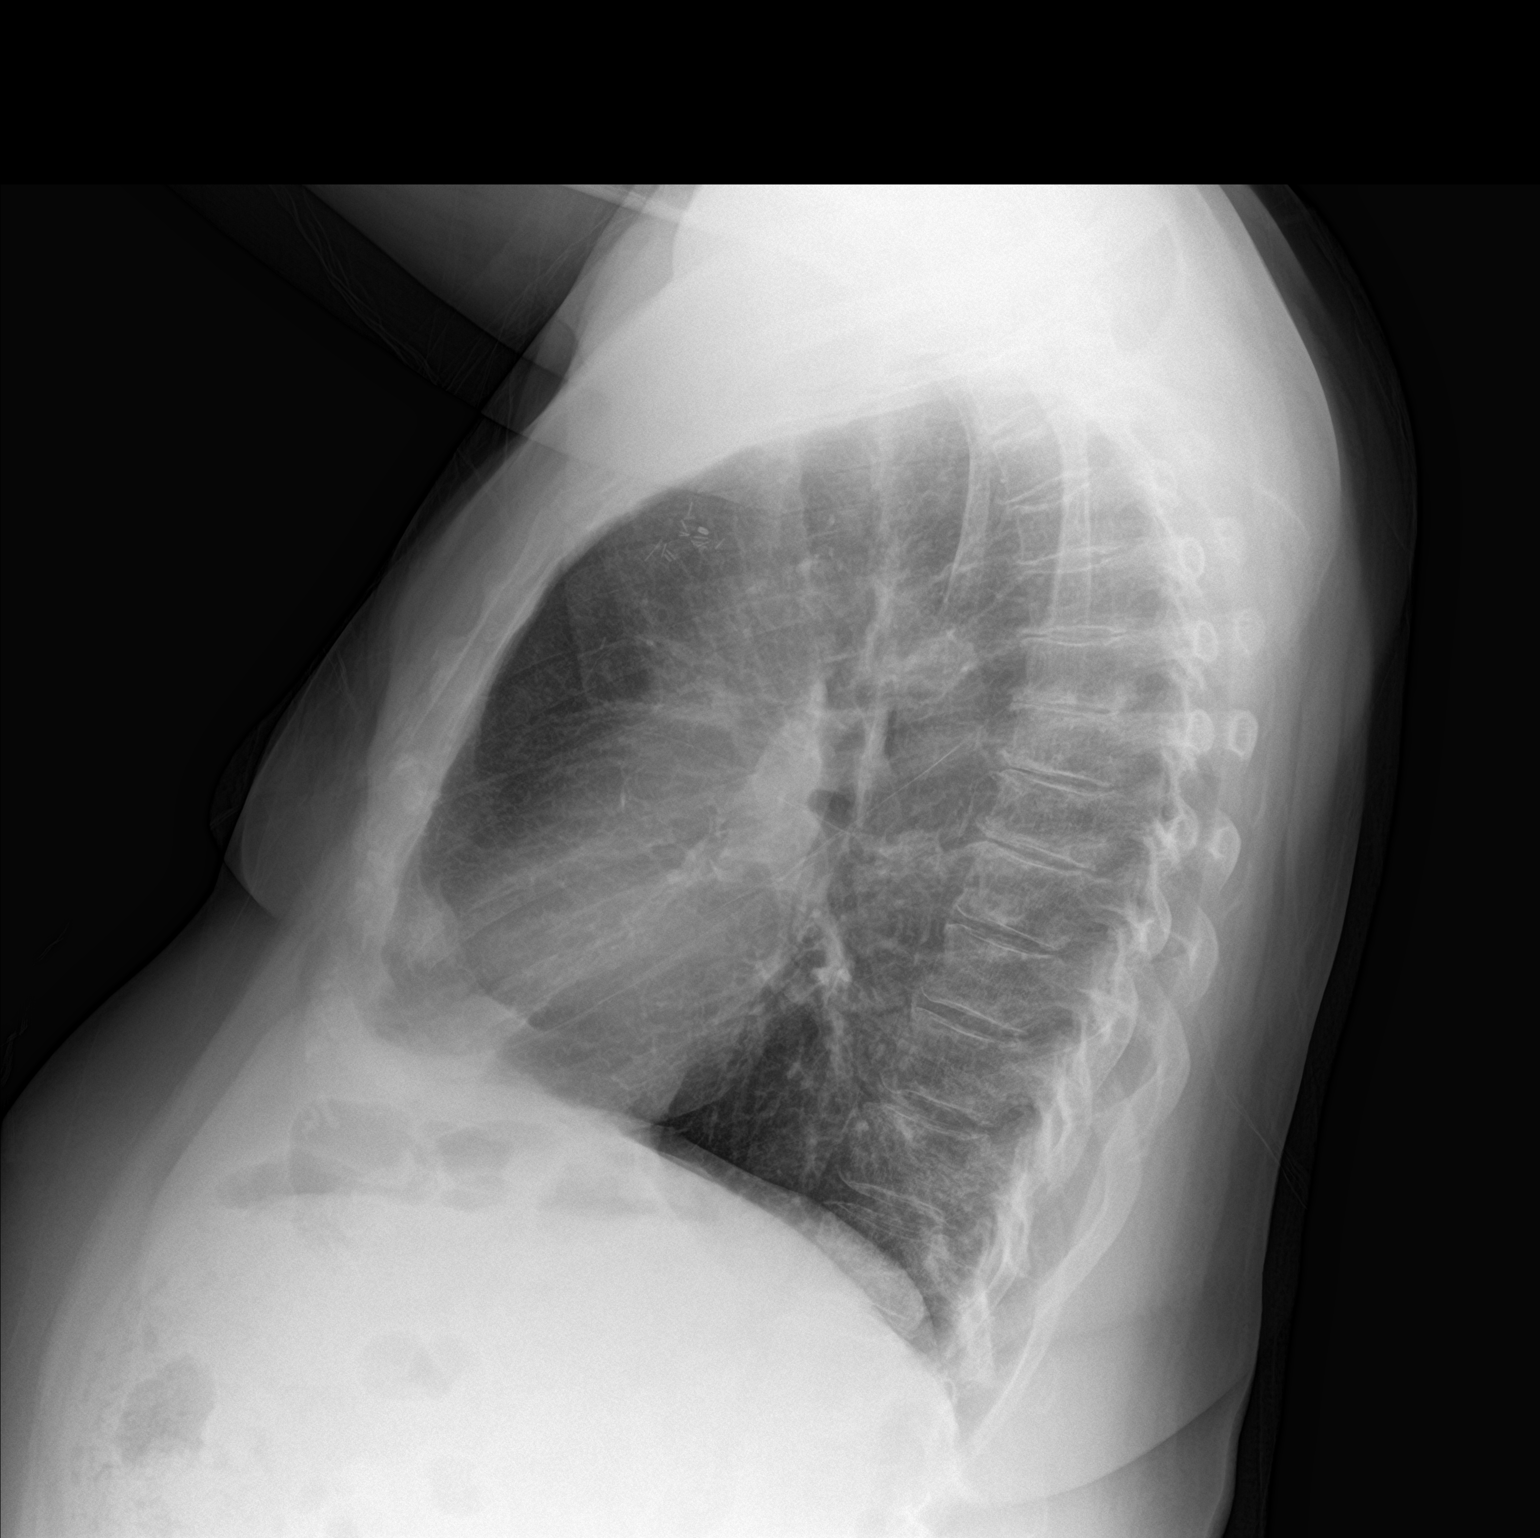

[2 of 2 positions shown; findings below may reference images not displayed]

FINDINGS: Emphysematous hyperinflation of the lungs without pulmonary
consolidation. No pulmonary edema, effusion or pneumothorax. Heart
size and mediastinal contours are stable with aortic
atherosclerosis. No aneurysm. Surgical clips project over the axilla
bilaterally.
IMPRESSION: Pulmonary hyperinflation without active pulmonary disease.

## 2018-10-19 NOTE — ED Triage Notes (Signed)
Pt fell about 3 weeks and hit her head. Pt c/o left leg pain.

## 2018-10-19 NOTE — ED Provider Notes (Signed)
[facility] EMERGENCY DEPARTMENT Provider Note   CSN: [id number] Arrival date & time: 10/19/18  2107    History   Chief Complaint Chief Complaint  Patient presents with  . Fall    HPI Michelle Henry is a 73 y.o. female.  She is complaining of left hip and thigh pain that started today.  She said the pain is improved since she has been here.  She relates it to a fall that she had about 3 weeks ago.  She said she struck her head and her left hip.  She did not lose consciousness.  She does not know why it started bothering her again today.  She is also had a chronic smoker's cough that she says is maybe a little bit worse.  Not productive of any phlegm.  No fevers or chills.  Shortness of breath is at baseline.  No numbness or weakness no abdominal pain vomiting or diarrhea.  She is tried nothing for her leg pain.     The history is provided by the patient.  Fall  This is a new problem. The current episode started 6 to 12 hours ago. The problem occurs constantly. The problem has been gradually improving. Associated symptoms include shortness of breath (chronic). Pertinent negatives include no chest pain, no abdominal pain and no headaches. The symptoms are aggravated by walking. Nothing relieves the symptoms. She has tried nothing for the symptoms. The treatment provided moderate relief.    Past Medical History:  Diagnosis Date  . Blood transfusion   . Cancer ([place])   . Chronic kidney disease   . COPD (chronic obstructive pulmonary disease) ([place])   . Diabetes mellitus   . Dysphagia   . GERD (gastroesophageal reflux disease)   . High cholesterol   . Hypertension   . Shortness of breath     Patient Active Problem List   Diagnosis Date Noted  . Breast cancer, left breast ([place]) 12/06/2016  . Breast cancer, left ([place]) 10/31/2016  . History of colonic polyps 03/20/2016  . SBO (small bowel obstruction) ([place]) 03/01/2011  . HTN (hypertension) 03/01/2011  . DM2 (diabetes mellitus, type  2) ([place]) 03/01/2011  . Hyperlipemia 03/01/2011  . Tobacco abuse 03/01/2011  . GERD (gastroesophageal reflux disease) 03/01/2011    Past Surgical History:  Procedure Laterality Date  . BREAST SURGERY    . COLONOSCOPY N/A 03/28/2017   Procedure: COLONOSCOPY;  Surgeon: Rogene Houston, MD;  Location: [facility] ENDO SUITE;  Service: Endoscopy;  Laterality: N/A;  1200 - moved to 9/5 @ 10:30  . ESOPHAGOGASTRODUODENOSCOPY (EGD) WITH ESOPHAGEAL DILATION  11/2012   [facility]  . MASTECTOMY  right side  . MASTECTOMY MODIFIED RADICAL Left 12/06/2016   Procedure: MASTECTOMY MODIFIED RADICAL;  Surgeon: [name], MD;  Location: [facility] ORS;  Service: General;  Laterality: Left;  . TONSILLECTOMY       OB History   No obstetric history on file.      Home Medications    Prior to Admission medications   Medication Sig Start Date End Date Taking? Authorizing Provider  albuterol (PROVENTIL HFA;VENTOLIN HFA) 108 (90 BASE) MCG/ACT inhaler Inhale 1-2 puffs into the lungs every 6 (six) hours as needed for wheezing or shortness of breath.    [provider]  anastrozole (ARIMIDEX) 1 MG tablet TAKE 1 TABLET BY MOUTH ONCE DAILY. 10/15/18   Derek Jack, MD  aspirin EC 81 MG tablet Take 81 mg by mouth daily after breakfast.     [provider]  beclomethasone (QVAR) 40 MCG/ACT inhaler Inhale 1 puff into the lungs daily.     [provider]  benazepril (LOTENSIN) 20 MG tablet Take 20 mg by mouth daily after breakfast.     [provider]  calcium-vitamin D (OSCAL WITH D) 500-200 MG-UNIT tablet Take 2 tablets by mouth daily with breakfast. 11/25/17   Derek Jack, MD  diphenhydrAMINE (BENADRYL) 25 MG tablet Take 25 mg by mouth every 6 (six) hours as needed (for sinus/allergies.).    [provider]  docusate sodium (COLACE) 100 MG capsule Take 200 mg by mouth daily after breakfast.    [provider]  FLOVENT [facility] 220 MCG/ACT inhaler  11/06/17   [provider]  Fluticasone-Salmeterol (ADVAIR) 250-50 MCG/DOSE AEPB  03/18/18   [provider]  hydrOXYzine (ATARAX/VISTARIL) 25 MG tablet Take 25 mg by mouth every 8 (eight) hours as needed for itching. 10/16/16   [provider]  Iron-Vitamin C (VITRON-C PO) Take 1 tablet by mouth daily after breakfast.    [provider]  lovastatin (MEVACOR) 20 MG tablet Take 20 mg by mouth at bedtime.    [provider]  metFORMIN (GLUCOPHAGE) 500 MG tablet Take 500 mg by mouth daily after breakfast.  07/27/15   [provider]  naproxen (NAPROSYN) 500 MG tablet Take 500 mg by mouth every 12 (twelve) hours as needed (for pain.).     [provider]  omeprazole (PRILOSEC) 20 MG capsule Take 20 mg by mouth daily before breakfast.     [provider]  ONE TOUCH ULTRA TEST test strip  09/10/17   [provider]  SPIRIVA RESPIMAT 2.5 MCG/ACT AERS  05/07/18   [provider]    Family History Family History  Problem Relation Age of Onset  . Hypertension Mother   . Kidney disease Mother   . Emphysema Father   . Diabetes Brother   . COPD Brother   . Heart attack Brother     Social History Social History   Tobacco Use  . Smoking status: Current Every Day Smoker    Packs/day: 0.75    Years: 53.00    Pack years: 39.75    Types: Cigarettes  . Smokeless tobacco: Never Used  Substance Use Topics  . Alcohol use: No  . Drug use: No     Allergies   Patient has no known allergies.   Review of Systems Review of Systems  Constitutional: Negative for fever.  HENT: Negative for sore throat.   Eyes: Negative for visual disturbance.  Respiratory: Positive for cough and shortness of breath (chronic).   Cardiovascular: Negative for chest pain.  Gastrointestinal: Negative for abdominal pain.  Genitourinary: Negative for dysuria.  Musculoskeletal: Negative for back pain.  Skin: Negative for rash.  Neurological: Negative for  headaches.     Physical Exam Updated Vital Signs BP (!) 143/81 (BP Location: Right Arm)   Pulse 80   Temp 98 F (36.7 C) (Oral)   Resp 16   Ht 5' (1.524 m)   Wt 68 kg   SpO2 99%   BMI 29.29 kg/m   Physical Exam Vitals signs and nursing note reviewed.  Constitutional:      General: She is not in acute distress.    Appearance: She is well-developed.  HENT:     Head: Normocephalic and atraumatic.  Eyes:     Conjunctiva/sclera: Conjunctivae normal.  Neck:     Musculoskeletal: Neck supple.  Cardiovascular:     Rate  and Rhythm: Normal rate and regular rhythm.     Heart sounds: No murmur.  Pulmonary:     Effort: Pulmonary effort is normal. No respiratory distress.     Breath sounds: Wheezing (scattered) present.  Abdominal:     Palpations: Abdomen is soft.     Tenderness: There is no abdominal tenderness.  Musculoskeletal:        General: Tenderness present. No swelling or deformity.     Right lower leg: No edema.     Left lower leg: No edema.     Comments: She is some mild tenderness in the left lateral hip.  Normal external external rotation.  Full range of motion at the knee and ankle.  No appreciable swelling in the leg.  Other extremities full range of motion without any limitations or pain.  Skin:    General: Skin is warm and dry.     Capillary Refill: Capillary refill takes less than 2 seconds.  Neurological:     General: No focal deficit present.     Mental Status: She is alert.     Sensory: No sensory deficit.     Motor: No weakness.     Gait: Gait normal.      ED Treatments / Results  Labs (all labs ordered are listed, but only abnormal results are displayed) Labs Reviewed - No data to display  EKG None  Radiology Dg Chest 2 View  Result Date: 10/19/2018 CLINICAL DATA:  Cough times a few months. EXAM: CHEST - 2 VIEW COMPARISON:  12/02/2016 FINDINGS: Emphysematous hyperinflation of the lungs without pulmonary consolidation. No pulmonary edema,  effusion or pneumothorax. Heart size and mediastinal contours are stable with aortic atherosclerosis. No aneurysm. Surgical clips project over the axilla bilaterally. IMPRESSION: Pulmonary hyperinflation without active pulmonary disease. Electronically Signed   By: Ashley Royalty M.D.   On: 10/19/2018 22:23   Dg Hip Unilat With Pelvis 2-3 Views Left  Result Date: 10/19/2018 CLINICAL DATA:  Left hip pain after fall 3 weeks ago. EXAM: DG HIP (WITH OR WITHOUT PELVIS) 2-3V LEFT COMPARISON:  None. FINDINGS: Degenerative disc disease of the included lower lumbar spine from L4 through S1. Mild facet arthropathy seen on the left at L4-5. No pelvic diastasis or fracture. Moderate joint space narrowing of both hips. No flattening of the femoral heads. No joint dislocation nor susp hip fracture. No suspicious osseous lesions. Soft tissue calcifications in the left hemipelvis likely reflect calcified fibroids. IMPRESSION: Degenerative joint space narrowing both hips. No acute osseous abnormality. 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 Electronically Signed   By: Ashley Royalty M.D.   On: 10/19/2018 22:29    Procedures Procedures (including critical care time)  Medications Ordered in ED Medications - No data to display   Initial Impression / Assessment and Plan / ED Course  I have reviewed the triage vital signs and the nursing notes.  Pertinent labs & imaging results that were available during my care of the patient were reviewed by me and considered in my medical decision making (see chart for details).  Clinical Course as of Oct 20 1118  Mon Oct 19, 2018  2230 Patient's chest x-ray and left hip x-ray were independently reviewed by me.  No gross infiltrate on the chest x-ray and no obvious fracture or dislocation on the pelvis and hip.   [MB]    Clinical Course User Index [MB] Hayden Rasmussen, MD      ddx - contusion, fracture,  dislocation, bursitis, arthritis   Final Clinical Impressions(s) / ED Diagnoses   Final diagnoses:  Left leg pain  Cough    ED Discharge Orders    None       Hayden Rasmussen, MD 10/20/18 1121

## 2018-10-19 NOTE — Discharge Instructions (Addendum)
You were seen in the emergency department for left hip and leg pain after a fall 3 weeks ago.  Your x-ray showed some arthritis but no obvious fracture or dislocation.  You can try some ice or heat to the area and Tylenol as needed for pain.  You also had a chest x-ray that did not show an obvious pneumonia.  Please follow-up with your doctor and return if any worsening symptoms.

## 2018-10-21 ENCOUNTER — Other Ambulatory Visit (HOSPITAL_COMMUNITY): Payer: Self-pay | Admitting: Hematology

## 2018-10-21 DIAGNOSIS — C50012 Malignant neoplasm of nipple and areola, left female breast: Secondary | ICD-10-CM

## 2018-11-05 ENCOUNTER — Other Ambulatory Visit (HOSPITAL_COMMUNITY): Payer: Medicare Other

## 2018-11-12 ENCOUNTER — Inpatient Hospital Stay (HOSPITAL_COMMUNITY): Payer: Medicare Other | Attending: Hematology

## 2018-11-12 ENCOUNTER — Other Ambulatory Visit: Payer: Self-pay

## 2018-11-12 ENCOUNTER — Ambulatory Visit (HOSPITAL_COMMUNITY): Payer: Medicare Other | Admitting: Nurse Practitioner

## 2018-11-12 DIAGNOSIS — N189 Chronic kidney disease, unspecified: Secondary | ICD-10-CM | POA: Diagnosis not present

## 2018-11-12 DIAGNOSIS — Z9013 Acquired absence of bilateral breasts and nipples: Secondary | ICD-10-CM | POA: Diagnosis not present

## 2018-11-12 DIAGNOSIS — K219 Gastro-esophageal reflux disease without esophagitis: Secondary | ICD-10-CM | POA: Diagnosis not present

## 2018-11-12 DIAGNOSIS — R5383 Other fatigue: Secondary | ICD-10-CM | POA: Diagnosis not present

## 2018-11-12 DIAGNOSIS — Z170 Estrogen receptor positive status [ER+]: Secondary | ICD-10-CM | POA: Diagnosis not present

## 2018-11-12 DIAGNOSIS — Z79899 Other long term (current) drug therapy: Secondary | ICD-10-CM | POA: Insufficient documentation

## 2018-11-12 DIAGNOSIS — Z7982 Long term (current) use of aspirin: Secondary | ICD-10-CM | POA: Insufficient documentation

## 2018-11-12 DIAGNOSIS — I129 Hypertensive chronic kidney disease with stage 1 through stage 4 chronic kidney disease, or unspecified chronic kidney disease: Secondary | ICD-10-CM | POA: Insufficient documentation

## 2018-11-12 DIAGNOSIS — E1122 Type 2 diabetes mellitus with diabetic chronic kidney disease: Secondary | ICD-10-CM | POA: Insufficient documentation

## 2018-11-12 DIAGNOSIS — C50412 Malignant neoplasm of upper-outer quadrant of left female breast: Secondary | ICD-10-CM | POA: Insufficient documentation

## 2018-11-12 DIAGNOSIS — Z79811 Long term (current) use of aromatase inhibitors: Secondary | ICD-10-CM | POA: Diagnosis not present

## 2018-11-12 DIAGNOSIS — G479 Sleep disorder, unspecified: Secondary | ICD-10-CM | POA: Insufficient documentation

## 2018-11-12 DIAGNOSIS — Z853 Personal history of malignant neoplasm of breast: Secondary | ICD-10-CM | POA: Diagnosis not present

## 2018-11-12 DIAGNOSIS — F17210 Nicotine dependence, cigarettes, uncomplicated: Secondary | ICD-10-CM | POA: Diagnosis not present

## 2018-11-12 DIAGNOSIS — E7800 Pure hypercholesterolemia, unspecified: Secondary | ICD-10-CM | POA: Insufficient documentation

## 2018-11-12 DIAGNOSIS — Z7951 Long term (current) use of inhaled steroids: Secondary | ICD-10-CM | POA: Insufficient documentation

## 2018-11-12 DIAGNOSIS — Z791 Long term (current) use of non-steroidal anti-inflammatories (NSAID): Secondary | ICD-10-CM | POA: Diagnosis not present

## 2018-11-12 DIAGNOSIS — J449 Chronic obstructive pulmonary disease, unspecified: Secondary | ICD-10-CM | POA: Diagnosis not present

## 2018-11-12 LAB — COMPREHENSIVE METABOLIC PANEL
ALT: 14 U/L (ref 0–44)
AST: 21 U/L (ref 15–41)
Albumin: 4.2 g/dL (ref 3.5–5.0)
Alkaline Phosphatase: 91 U/L (ref 38–126)
Anion gap: 11 (ref 5–15)
BUN: 8 mg/dL (ref 8–23)
CO2: 24 mmol/L (ref 22–32)
Calcium: 9.8 mg/dL (ref 8.9–10.3)
Chloride: 105 mmol/L (ref 98–111)
Creatinine, Ser: 0.9 mg/dL (ref 0.44–1.00)
GFR calc Af Amer: >60 mL/min (ref >60)
GFR calc non Af Amer: >60 mL/min (ref >60)
Glucose, Bld: 131 mg/dL — ABNORMAL HIGH (ref 70–99)
Potassium: 3.4 mmol/L — ABNORMAL LOW (ref 3.5–5.1)
Sodium: 140 mmol/L (ref 135–145)
Total Bilirubin: 0.4 mg/dL (ref 0.3–1.2)
Total Protein: 7.8 g/dL (ref 6.5–8.1)

## 2018-11-12 LAB — CBC WITH DIFFERENTIAL/PLATELET
Abs Immature Granulocytes: 0.01 10*3/uL (ref 0.00–0.07)
Basophils Absolute: 0 10*3/uL (ref 0.0–0.1)
Basophils Relative: 0 %
Eosinophils Absolute: 0.2 10*3/uL (ref 0.0–0.5)
Eosinophils Relative: 4 %
HCT: 37.4 % (ref 36.0–46.0)
Hemoglobin: 12.3 g/dL (ref 12.0–15.0)
Immature Granulocytes: 0 %
Lymphocytes Relative: 44 %
Lymphs Abs: 2.4 10*3/uL (ref 0.7–4.0)
MCH: 29.4 pg (ref 26.0–34.0)
MCHC: 32.9 g/dL (ref 30.0–36.0)
MCV: 89.3 fL (ref 80.0–100.0)
Monocytes Absolute: 0.3 10*3/uL (ref 0.1–1.0)
Monocytes Relative: 6 %
Neutro Abs: 2.4 10*3/uL (ref 1.7–7.7)
Neutrophils Relative %: 46 %
Platelets: 227 10*3/uL (ref 150–400)
RBC: 4.19 MIL/uL (ref 3.87–5.11)
RDW: 13.1 % (ref 11.5–15.5)
WBC: 5.3 10*3/uL (ref 4.0–10.5)
nRBC: 0 % (ref 0.0–0.2)

## 2018-11-13 LAB — CANCER ANTIGEN 15-3: CA 15-3: 6.9 U/mL (ref 0.0–25.0)

## 2018-11-19 ENCOUNTER — Other Ambulatory Visit: Payer: Self-pay

## 2018-11-19 ENCOUNTER — Inpatient Hospital Stay (HOSPITAL_BASED_OUTPATIENT_CLINIC_OR_DEPARTMENT_OTHER): Payer: Medicare Other | Admitting: Nurse Practitioner

## 2018-11-19 DIAGNOSIS — C50412 Malignant neoplasm of upper-outer quadrant of left female breast: Secondary | ICD-10-CM

## 2018-11-19 DIAGNOSIS — Z79899 Other long term (current) drug therapy: Secondary | ICD-10-CM

## 2018-11-19 DIAGNOSIS — R5383 Other fatigue: Secondary | ICD-10-CM

## 2018-11-19 DIAGNOSIS — Z853 Personal history of malignant neoplasm of breast: Secondary | ICD-10-CM | POA: Diagnosis not present

## 2018-11-19 DIAGNOSIS — G479 Sleep disorder, unspecified: Secondary | ICD-10-CM

## 2018-11-19 DIAGNOSIS — E7800 Pure hypercholesterolemia, unspecified: Secondary | ICD-10-CM

## 2018-11-19 DIAGNOSIS — Z7982 Long term (current) use of aspirin: Secondary | ICD-10-CM

## 2018-11-19 DIAGNOSIS — Z79811 Long term (current) use of aromatase inhibitors: Secondary | ICD-10-CM | POA: Diagnosis not present

## 2018-11-19 DIAGNOSIS — Z791 Long term (current) use of non-steroidal anti-inflammatories (NSAID): Secondary | ICD-10-CM

## 2018-11-19 DIAGNOSIS — E1122 Type 2 diabetes mellitus with diabetic chronic kidney disease: Secondary | ICD-10-CM

## 2018-11-19 DIAGNOSIS — Z9013 Acquired absence of bilateral breasts and nipples: Secondary | ICD-10-CM

## 2018-11-19 DIAGNOSIS — J449 Chronic obstructive pulmonary disease, unspecified: Secondary | ICD-10-CM

## 2018-11-19 DIAGNOSIS — Z7951 Long term (current) use of inhaled steroids: Secondary | ICD-10-CM

## 2018-11-19 DIAGNOSIS — F17210 Nicotine dependence, cigarettes, uncomplicated: Secondary | ICD-10-CM

## 2018-11-19 DIAGNOSIS — Z170 Estrogen receptor positive status [ER+]: Secondary | ICD-10-CM

## 2018-11-19 DIAGNOSIS — I129 Hypertensive chronic kidney disease with stage 1 through stage 4 chronic kidney disease, or unspecified chronic kidney disease: Secondary | ICD-10-CM

## 2018-11-19 DIAGNOSIS — K219 Gastro-esophageal reflux disease without esophagitis: Secondary | ICD-10-CM

## 2018-11-19 DIAGNOSIS — N189 Chronic kidney disease, unspecified: Secondary | ICD-10-CM

## 2018-11-19 NOTE — Patient Instructions (Addendum)
[place] at [facility] Discharge Instructions  Follow up in 6 months with labs and dexa scan   Thank you for choosing [place] at [facility] to provide your oncology and hematology care.  To afford each patient quality time with our provider, please arrive at least 15 minutes before your scheduled appointment time.   If you have a lab appointment with the [facility] please come in thru the  Main Entrance and check in at the main information desk  You need to re-schedule your appointment should you arrive 10 or more minutes late.  We strive to give you quality time with our providers, and arriving late affects you and other patients whose appointments are after yours.  Also, if you no show three or more times for appointments you may be dismissed from the clinic at the providers discretion.     Again, thank you for choosing [facility]  Our hope is that these requests will decrease the amount of time that you wait before being seen by our physicians.       _____________________________________________________________  Should you have questions after your visit to [facility], please contact our office at [phone] between the hours of 8:00 a.m. and 4:30 p.m.  Voicemails left after 4:00 p.m. will not be returned until the following business day.  For prescription refill requests, have your pharmacy contact our office and allow 72 hours.    Cancer Center Support Programs:   > Cancer Support Group  2nd Tuesday of the month 1pm-2pm, Journey Room

## 2018-11-19 NOTE — Progress Notes (Signed)
Pt taking anastrozole. Pt not having any side effects from taking this medication.

## 2018-11-19 NOTE — Assessment & Plan Note (Addendum)
1.  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 - She has a remote history of right breast cancer in 2006, status post right mastectomy treated at [facility]. -She is tolerating her anastrozole with no side effects. -Physical examination today was within normal limits. -Labs on 11/12/2018 showed CA 15-3 was 6.9.  All other labs were within normal limits. -We will follow her in 6 months with repeat labs.  2.  Bone density: -Her last DEXA scan was on 01/2017. -She had a T score of -1.0 which was normal. -We will follow her in 2 to 3 years. -She is taking calcium and vitamin D daily. -We will repeat a DEXA scan on her next visit in 6 months.

## 2018-11-19 NOTE — Progress Notes (Signed)
[address] [place], [place] [address]   CLINIC:  Medical Oncology/Hematology  PCP:  Michelle Henry [phone]   REASON FOR VISIT: Follow-up for bilateral breast cancer  CURRENT THERAPY: Anastrozole started in June 2018  BRIEF ONCOLOGIC HISTORY:    Breast cancer, left ([place])   07/30/2016 Mammogram    Left diagnostic mammogram: Mammographic distortion in the lateral inferior left breast thought to correlate with a region of distortion and a small associated mass seen on ultrasound.    07/30/2016 Imaging    Left breast US: Targeted ultrasound is performed, showing a mass and distortion in the left breast at 4 o'clock, 4 cm from the nipple, thought to correlate with the mammographic finding measuring 7 x 4 x 6 mm. The mass is irregular and taller than wide. No left axillary adenopathy.    08/13/2016 Pathology Results    Left breast core needle biopsy: Breast, left, needle core biopsy, 4:00 - INVASIVE DUCTAL CARCINOMA. - DUCTAL CARCINOMA IN SITU. - SEE COMMENT. Microscopic Comment The invasive carcinoma is grade 1 and is highlighted by a lack of myoepithelial cells as demonstrated by p63, calponin, and smooth muscle myosin stains.  Hormone Profile: ER positive, PR positive, HER2 negative    08/13/2016 Initial Diagnosis    Breast cancer, left ([place])    09/03/2016 Imaging    MRI breast: IMPRESSION: 1. Architectural distortion involving the lower outer quadrant of the left breast. The tissue marker clip placed at the time of ultrasound biopsy is within the area of distortion. 2. 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 There is no mammographic correlate. 3. No pathologic lymphadenopathy.    12/06/2016 Surgery    Left modified radical mastectomy by Dr. Arnoldo Morale. Her surgical path  demonstrated invasive ductal carcinoma with extracellular mucin, grade 1, spanning 2 cm. DCIS grade 1. All margins negative. 0/8 lymph nodes resected were positive for malignancy. Final pathology staging [id number] (Stage IB).     01/14/2017 Oncotype testing    Oncotype Dx recurrence score of 14.      CANCER STAGING: Cancer Staging Breast cancer, left ([place]) Staging form: Breast, AJCC 8th Edition - Pathologic: Stage IA (pT1c, pN0, cM0, G1, ER: Positive, PR: Positive, HER2: Negative) - Signed by Twana First, MD on 01/01/2017    INTERVAL HISTORY:  [name] 73 y.o. female returns for routine follow-up follow-up for breast cancer.  She is here today and been doing well since her last visit.  She reports she has been taking her anastrozole as prescribed, with no major side effects.  She does have problems sleeping at times.  But she has no other complaints at this time Denies any nausea, vomiting, or diarrhea. Denies any new pains. Had not noticed any recent bleeding such as epistaxis, hematuria or hematochezia. Denies recent chest pain on exertion, shortness of breath on minimal exertion, pre-syncopal episodes, or palpitations. Denies any numbness or tingling in hands or feet. Denies any recent fevers, infections, or recent hospitalizations. Patient reports appetite at 100% and energy level at 25%.  She is eating well and maintaining her weight at this time.    REVIEW OF SYSTEMS:  Review of Systems  Constitutional: Positive for fatigue.  Psychiatric/Behavioral: Positive for sleep disturbance.  All other systems reviewed and are negative.    PAST MEDICAL/SURGICAL HISTORY:  Past Medical History:  Diagnosis Date  . Blood transfusion   . Cancer ([facility])   . Chronic kidney disease   . COPD (chronic obstructive pulmonary disease) ([place])   . Diabetes mellitus   . Dysphagia   . GERD (gastroesophageal reflux disease)   . High cholesterol   . Hypertension   . Shortness of breath    Past  Surgical History:  Procedure Laterality Date  . BREAST SURGERY    . COLONOSCOPY N/A 03/28/2017   Procedure: COLONOSCOPY;  Surgeon: Rogene Houston, MD;  Location: [facility];  Service: Endoscopy;  Laterality: N/A;  1200 - moved to 9/5 @ 10:30  . ESOPHAGOGASTRODUODENOSCOPY (EGD) WITH ESOPHAGEAL DILATION  11/2012   [facility]  . MASTECTOMY  right side  . MASTECTOMY MODIFIED RADICAL Left 12/06/2016   Procedure: MASTECTOMY MODIFIED RADICAL;  Surgeon: [name], MD;  Location: [facility] ORS;  Service: General;  Laterality: Left;  . TONSILLECTOMY       SOCIAL HISTORY:  Social History   Socioeconomic History  . Marital status: Single    Spouse name: Not on file  . Number of children: Not on file  . Years of education: Not on file  . Highest education level: Not on file  Occupational History  . Not on file  Social Needs  . Financial resource strain: Not on file  . Food insecurity:    Worry: Not on file    Inability: Not on file  . Transportation needs:    Medical: Not on file    Non-medical: Not on file  Tobacco Use  . Smoking status: Current Every Day Smoker    Packs/day: 0.75    Years: 53.00    Pack years: 39.75    Types: Cigarettes  . Smokeless tobacco: Never Used  Substance and Sexual Activity  . Alcohol use: No  . Drug use: No  . Sexual activity: Yes  Lifestyle  . Physical activity:    Days per week: Not on file    Minutes per session: Not on file  . Stress: Not on file  Relationships  . Social connections:    Talks on phone: Not on file    Gets together: Not on file    Attends religious service: Not on file    Active member of club or organization: Not on file    Attends meetings of clubs or organizations: Not on file    Relationship status: Not on file  . Intimate partner violence:    Fear of current or ex partner: Not on file    Emotionally abused: Not on file    Physically abused: Not on file    Forced sexual activity: Not on file  Other Topics Concern  . Not on  file  Social History Narrative  . Not on file    FAMILY HISTORY:  Family History  Problem Relation Age of Onset  . Hypertension Mother   . Kidney disease Mother   . Emphysema Father   . Diabetes Brother   . COPD Brother   . Heart attack Brother     CURRENT MEDICATIONS:  Outpatient Encounter Medications as of 11/19/2018  Medication Sig  . albuterol (PROVENTIL HFA;VENTOLIN HFA) 108 (90 BASE) MCG/ACT inhaler Inhale 1-2 puffs into the lungs every 6 (six) hours as needed for wheezing or shortness of breath.  . anastrozole (ARIMIDEX) 1 MG tablet TAKE 1 TABLET BY MOUTH ONCE DAILY.  Marland Kitchen aspirin EC 81 MG tablet Take 81 mg by mouth daily after breakfast.   . beclomethasone (  QVAR) 40 MCG/ACT inhaler Inhale 1 puff into the lungs daily.   . benazepril (LOTENSIN) 20 MG tablet Take 20 mg by mouth daily after breakfast.   . calcium-vitamin D (OSCAL WITH D) 500-200 MG-UNIT TABS tablet TAKE 2 TABLETS BY MOUTH IN THE MORNING WITH BREAKFAST.  Marland Kitchen diphenhydrAMINE (BENADRYL) 25 MG tablet Take 25 mg by mouth every 6 (six) hours as needed (for sinus/allergies.).  Marland Kitchen docusate sodium (COLACE) 100 MG capsule Take 200 mg by mouth daily after breakfast.  . FEROSUL 325 (65 Fe) MG tablet   . FLOVENT HFA 220 MCG/ACT inhaler   . fluticasone (FLONASE) 50 MCG/ACT nasal spray   . Fluticasone-Salmeterol (ADVAIR) 250-50 MCG/DOSE AEPB   . gabapentin (NEURONTIN) 300 MG capsule   . hydrOXYzine (ATARAX/VISTARIL) 25 MG tablet Take 25 mg by mouth every 8 (eight) hours as needed for itching.  Marland Kitchen ibuprofen (ADVIL) 600 MG tablet   . Iron-Vitamin C (VITRON-C PO) Take 1 tablet by mouth daily after breakfast.  . lovastatin (MEVACOR) 20 MG tablet Take 20 mg by mouth at bedtime.  . metFORMIN (GLUCOPHAGE) 500 MG tablet Take 500 mg by mouth daily after breakfast.   . naproxen (NAPROSYN) 500 MG tablet Take 500 mg by mouth every 12 (twelve) hours as needed (for pain.).   Marland Kitchen omeprazole (PRILOSEC) 20 MG capsule Take 20 mg by mouth daily before  breakfast.   . ONE TOUCH ULTRA TEST test strip   . SPIRIVA RESPIMAT 2.5 MCG/ACT AERS    Facility-Administered Encounter Medications as of 11/19/2018  Medication  . gadobenate dimeglumine (MULTIHANCE) injection 14 mL    ALLERGIES:  No Known Allergies   PHYSICAL EXAM:  ECOG Performance status: 1  Vitals:   11/19/18 0954  BP: (!) 146/68  Pulse: 89  Resp: 18  Temp: 98.1 F (36.7 C)  SpO2: 99%   Filed Weights   11/19/18 0954  Weight: 146 lb (66.2 kg)    Physical Exam Constitutional:      Appearance: Normal appearance. She is normal weight.  Cardiovascular:     Rate and Rhythm: Normal rate and regular rhythm.     Heart sounds: Normal heart sounds.  Pulmonary:     Effort: Pulmonary effort is normal.     Breath sounds: Normal breath sounds.  Abdominal:     General: Bowel sounds are normal.     Palpations: Abdomen is soft.  Musculoskeletal: Normal range of motion.  Skin:    General: Skin is warm and dry.  Neurological:     Mental Status: She is alert and oriented to person, place, and time. Mental status is at baseline.  Psychiatric:        Mood and Affect: Mood normal.        Behavior: Behavior normal.        Thought Content: Thought content normal.        Judgment: Judgment normal.   Breast: Double mastectomy sites are within normal limits.  No lymphedema noted. No palpable masses, no skin changes or no adenopathy.   LABORATORY DATA:  I have reviewed the labs as listed.  CBC    Component Value Date/Time   WBC 5.3 11/12/2018 0917   RBC 4.19 11/12/2018 0917   HGB 12.3 11/12/2018 0917   HCT 37.4 11/12/2018 0917   PLT 227 11/12/2018 0917   MCV 89.3 11/12/2018 0917   MCH 29.4 11/12/2018 0917   MCHC 32.9 11/12/2018 0917   RDW 13.1 11/12/2018 0917   LYMPHSABS 2.4 11/12/2018 0917   MONOABS  0.3 11/12/2018 0917   EOSABS 0.2 11/12/2018 0917   BASOSABS 0.0 11/12/2018 0917   CMP Latest Ref Rng & Units 11/12/2018 05/08/2018 10/31/2017  Glucose 70 - 99 mg/dL 131(H)  237(H) 166(H)  BUN 8 - 23 mg/dL '8 9 11  ' Creatinine 0.44 - 1.00 mg/dL 0.90 0.99 0.92  Sodium 135 - 145 mmol/L 140 140 142  Potassium 3.5 - 5.1 mmol/L 3.4(L) 3.6 3.3(L)  Chloride 98 - 111 mmol/L 105 105 107  CO2 22 - 32 mmol/L '24 26 23  ' Calcium 8.9 - 10.3 mg/dL 9.8 9.5 9.3  Total Protein 6.5 - 8.1 g/dL 7.8 7.7 7.2  Total Bilirubin 0.3 - 1.2 mg/dL 0.4 0.4 0.3  Alkaline Phos 38 - 126 U/L 91 85 83  AST 15 - 41 U/L '21 25 20  ' ALT 0 - 44 U/L 14 21 12(L)     I personally performed a face-to-face visit.  All questions were answered to patient's stated satisfaction. Encouraged patient to call with any new concerns or questions before his next visit to the cancer center and we can certain see him sooner, if needed.     ASSESSMENT & PLAN:   Breast cancer, left ([place]) 1.  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 - She has a remote history of right breast cancer in 2006, status post right mastectomy treated at [facility]. -She is tolerating her anastrozole with no side effects. -Physical examination today was within normal limits. -Labs on 11/12/2018 showed CA 15-3 was 6.9.  All other labs were within normal limits. -We will follow her in 6 months with repeat labs.  2.  Bone density: -Her last DEXA scan was on 01/2017. -She had a T score of -1.0 which was normal. -We will follow her in 2 to 3 years. -She is taking calcium and vitamin D daily. -We will repeat a DEXA scan on her next visit in 6 months.      Orders placed this encounter:  Orders Placed This Encounter  Procedures  . DG Bone Density  . Magnesium  . CBC with Differential/Platelet  . Comprehensive metabolic panel  . Vitamin B12  . VITAMIN D 25 Hydroxy (Vit-D Deficiency, Fractures)  . Lactate dehydrogenase  . Cancer antigen 15-3      Francene Finders, FNP-C [place] [phone]

## 2018-11-30 ENCOUNTER — Other Ambulatory Visit (HOSPITAL_COMMUNITY): Payer: Self-pay | Admitting: Hematology

## 2018-11-30 DIAGNOSIS — C50012 Malignant neoplasm of nipple and areola, left female breast: Secondary | ICD-10-CM

## 2019-01-08 ENCOUNTER — Other Ambulatory Visit (HOSPITAL_COMMUNITY): Payer: Self-pay | Admitting: Hematology

## 2019-01-08 DIAGNOSIS — C50012 Malignant neoplasm of nipple and areola, left female breast: Secondary | ICD-10-CM

## 2019-02-26 ENCOUNTER — Other Ambulatory Visit (HOSPITAL_COMMUNITY): Payer: Self-pay | Admitting: Hematology

## 2019-05-06 ENCOUNTER — Other Ambulatory Visit: Payer: Self-pay

## 2019-05-06 ENCOUNTER — Emergency Department (HOSPITAL_COMMUNITY)
Admission: EM | Admit: 2019-05-06 | Discharge: 2019-05-06 | Disposition: A | Discharge status: Home / Self Care | Payer: Medicare Other | Attending: Emergency Medicine | Admitting: Emergency Medicine

## 2019-05-06 ENCOUNTER — Encounter (HOSPITAL_COMMUNITY): Payer: Self-pay

## 2019-05-06 DIAGNOSIS — R519 Headache, unspecified: Secondary | ICD-10-CM | POA: Diagnosis present

## 2019-05-06 DIAGNOSIS — Z5321 Procedure and treatment not carried out due to patient leaving prior to being seen by health care provider: Secondary | ICD-10-CM | POA: Diagnosis not present

## 2019-05-06 NOTE — ED Triage Notes (Signed)
Pt states she fell last week and hit her head and since then her head has been hurting. Her PCP recommended that she come in to be seen. Denies LOC with fall and she fell due to being unstable

## 2019-05-13 ENCOUNTER — Other Ambulatory Visit: Payer: Self-pay

## 2019-05-13 ENCOUNTER — Ambulatory Visit (HOSPITAL_COMMUNITY)
Admission: RE | Admit: 2019-05-13 | Discharge: 2019-05-13 | Disposition: A | Discharge status: Home / Self Care | Payer: Medicare Other | Source: Ambulatory Visit | Attending: Nurse Practitioner | Admitting: Nurse Practitioner

## 2019-05-13 ENCOUNTER — Inpatient Hospital Stay (HOSPITAL_COMMUNITY): Payer: Medicare Other | Attending: Hematology

## 2019-05-13 DIAGNOSIS — E119 Type 2 diabetes mellitus without complications: Secondary | ICD-10-CM | POA: Diagnosis not present

## 2019-05-13 DIAGNOSIS — Z7984 Long term (current) use of oral hypoglycemic drugs: Secondary | ICD-10-CM | POA: Insufficient documentation

## 2019-05-13 DIAGNOSIS — M8589 Other specified disorders of bone density and structure, multiple sites: Secondary | ICD-10-CM | POA: Diagnosis not present

## 2019-05-13 DIAGNOSIS — Z79811 Long term (current) use of aromatase inhibitors: Secondary | ICD-10-CM | POA: Insufficient documentation

## 2019-05-13 DIAGNOSIS — Z7982 Long term (current) use of aspirin: Secondary | ICD-10-CM | POA: Diagnosis not present

## 2019-05-13 DIAGNOSIS — J449 Chronic obstructive pulmonary disease, unspecified: Secondary | ICD-10-CM | POA: Diagnosis not present

## 2019-05-13 DIAGNOSIS — I129 Hypertensive chronic kidney disease with stage 1 through stage 4 chronic kidney disease, or unspecified chronic kidney disease: Secondary | ICD-10-CM | POA: Diagnosis not present

## 2019-05-13 DIAGNOSIS — Z170 Estrogen receptor positive status [ER+]: Secondary | ICD-10-CM | POA: Insufficient documentation

## 2019-05-13 DIAGNOSIS — Z23 Encounter for immunization: Secondary | ICD-10-CM | POA: Diagnosis not present

## 2019-05-13 DIAGNOSIS — Z853 Personal history of malignant neoplasm of breast: Secondary | ICD-10-CM | POA: Diagnosis not present

## 2019-05-13 DIAGNOSIS — N189 Chronic kidney disease, unspecified: Secondary | ICD-10-CM | POA: Insufficient documentation

## 2019-05-13 DIAGNOSIS — C50412 Malignant neoplasm of upper-outer quadrant of left female breast: Secondary | ICD-10-CM | POA: Diagnosis not present

## 2019-05-13 DIAGNOSIS — F17210 Nicotine dependence, cigarettes, uncomplicated: Secondary | ICD-10-CM | POA: Insufficient documentation

## 2019-05-13 DIAGNOSIS — K219 Gastro-esophageal reflux disease without esophagitis: Secondary | ICD-10-CM | POA: Insufficient documentation

## 2019-05-13 DIAGNOSIS — Z79899 Other long term (current) drug therapy: Secondary | ICD-10-CM | POA: Diagnosis not present

## 2019-05-13 DIAGNOSIS — Z9013 Acquired absence of bilateral breasts and nipples: Secondary | ICD-10-CM | POA: Diagnosis not present

## 2019-05-13 LAB — COMPREHENSIVE METABOLIC PANEL
ALT: 11 U/L (ref 0–44)
AST: 17 U/L (ref 15–41)
Albumin: 4.1 g/dL (ref 3.5–5.0)
Alkaline Phosphatase: 83 U/L (ref 38–126)
Anion gap: 10 (ref 5–15)
BUN: 10 mg/dL (ref 8–23)
CO2: 26 mmol/L (ref 22–32)
Calcium: 10 mg/dL (ref 8.9–10.3)
Chloride: 106 mmol/L (ref 98–111)
Creatinine, Ser: 0.9 mg/dL (ref 0.44–1.00)
GFR calc Af Amer: >60 mL/min (ref >60)
GFR calc non Af Amer: >60 mL/min (ref >60)
Glucose, Bld: 175 mg/dL — ABNORMAL HIGH (ref 70–99)
Potassium: 3.6 mmol/L (ref 3.5–5.1)
Sodium: 142 mmol/L (ref 135–145)
Total Bilirubin: 0.3 mg/dL (ref 0.3–1.2)
Total Protein: 7.8 g/dL (ref 6.5–8.1)

## 2019-05-13 LAB — CBC WITH DIFFERENTIAL/PLATELET
Abs Immature Granulocytes: 0.01 10*3/uL (ref 0.00–0.07)
Basophils Absolute: 0 10*3/uL (ref 0.0–0.1)
Basophils Relative: 1 %
Eosinophils Absolute: 0.1 10*3/uL (ref 0.0–0.5)
Eosinophils Relative: 3 %
HCT: 39.5 % (ref 36.0–46.0)
Hemoglobin: 12.5 g/dL (ref 12.0–15.0)
Immature Granulocytes: 0 %
Lymphocytes Relative: 39 %
Lymphs Abs: 2 10*3/uL (ref 0.7–4.0)
MCH: 29.5 pg (ref 26.0–34.0)
MCHC: 31.6 g/dL (ref 30.0–36.0)
MCV: 93.2 fL (ref 80.0–100.0)
Monocytes Absolute: 0.3 10*3/uL (ref 0.1–1.0)
Monocytes Relative: 6 %
Neutro Abs: 2.5 10*3/uL (ref 1.7–7.7)
Neutrophils Relative %: 51 %
Platelets: 269 10*3/uL (ref 150–400)
RBC: 4.24 MIL/uL (ref 3.87–5.11)
RDW: 12.8 % (ref 11.5–15.5)
WBC: 5 10*3/uL (ref 4.0–10.5)
nRBC: 0 % (ref 0.0–0.2)

## 2019-05-13 LAB — MAGNESIUM: Magnesium: 1.6 mg/dL — ABNORMAL LOW (ref 1.7–2.4)

## 2019-05-13 LAB — LACTATE DEHYDROGENASE: LDH: 156 U/L (ref 98–192)

## 2019-05-13 LAB — VITAMIN D 25 HYDROXY (VIT D DEFICIENCY, FRACTURES): Vit D, 25-Hydroxy: 36.83 ng/mL (ref 30–100)

## 2019-05-13 LAB — VITAMIN B12: Vitamin B-12: 208 pg/mL (ref 180–914)

## 2019-05-14 LAB — CANCER ANTIGEN 15-3: CA 15-3: 6.6 U/mL (ref 0.0–25.0)

## 2019-05-20 ENCOUNTER — Inpatient Hospital Stay (HOSPITAL_BASED_OUTPATIENT_CLINIC_OR_DEPARTMENT_OTHER): Payer: Medicare Other | Admitting: Hematology

## 2019-05-20 ENCOUNTER — Other Ambulatory Visit: Payer: Self-pay

## 2019-05-20 ENCOUNTER — Ambulatory Visit (HOSPITAL_COMMUNITY): Payer: Medicare Other | Admitting: Nurse Practitioner

## 2019-05-20 VITALS — BP 128/55 | HR 103 | Temp 97.3°F | Resp 16 | Wt 141.0 lb

## 2019-05-20 DIAGNOSIS — C50412 Malignant neoplasm of upper-outer quadrant of left female breast: Secondary | ICD-10-CM

## 2019-05-20 DIAGNOSIS — Z0000 Encounter for general adult medical examination without abnormal findings: Secondary | ICD-10-CM | POA: Diagnosis not present

## 2019-05-20 DIAGNOSIS — Z170 Estrogen receptor positive status [ER+]: Secondary | ICD-10-CM

## 2019-05-20 MED ORDER — INFLUENZA VAC A&B SA ADJ QUAD 0.5 ML IM PRSY
0.5000 mL | PREFILLED_SYRINGE | Freq: Once | INTRAMUSCULAR | Status: AC
  Administered 2019-05-20: 0.5 mL via INTRAMUSCULAR
  Filled 2019-05-20: qty 0.5

## 2019-05-21 NOTE — Progress Notes (Signed)
[place] [place], [place] [address]   CLINIC:  Medical Oncology/Hematology  PCP:  Vesta Mixer [address] [place] [address] [phone]   REASON FOR VISIT:  Follow-up for breast cancer  CURRENT THERAPY: Anastrozole  BRIEF ONCOLOGIC HISTORY:  Oncology History  Breast cancer, left ([place])  07/30/2016 Mammogram   Left diagnostic mammogram: Mammographic distortion in the lateral inferior left breast thought to correlate with a region of distortion and a small associated mass seen on ultrasound.   07/30/2016 Imaging   Left breast US: Targeted ultrasound is performed, showing a mass and distortion in the left breast at 4 o'clock, 4 cm from the nipple, thought to correlate with the mammographic finding measuring 7 x 4 x 6 mm. The mass is irregular and taller than wide. No left axillary adenopathy.   08/13/2016 Pathology Results   Left breast core needle biopsy: Breast, left, needle core biopsy, 4:00 - INVASIVE DUCTAL CARCINOMA. - DUCTAL CARCINOMA IN SITU. - SEE COMMENT. Microscopic Comment The invasive carcinoma is grade 1 and is highlighted by a lack of myoepithelial cells as demonstrated by p63, calponin, and smooth muscle myosin stains.  Hormone Profile: ER positive, PR positive, HER2 negative   08/13/2016 Initial Diagnosis   Breast cancer, left ([place])   09/03/2016 Imaging   MRI breast: IMPRESSION: 1. Architectural distortion involving the lower outer quadrant of the left breast. The tissue marker clip placed at the time of ultrasound biopsy is within the area of distortion. 2. 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 There is no mammographic correlate. 3. No pathologic lymphadenopathy.   12/06/2016 Surgery   Left modified radical mastectomy by Dr. Arnoldo Morale. Her surgical path demonstrated invasive ductal  carcinoma with extracellular mucin, grade 1, spanning 2 cm. DCIS grade 1. All margins negative. 0/8 lymph nodes resected were positive for malignancy. Final pathology staging [id number] (Stage IB).    01/14/2017 Oncotype testing   Oncotype Dx recurrence score of 14.      CANCER STAGING: Cancer Staging Breast cancer, left ([place]) Staging form: Breast, AJCC 8th Edition - Pathologic: Stage IA (pT1c, pN0, cM0, G1, ER: Positive, PR: Positive, HER2: Negative) - Signed by Twana First, MD on 01/01/2017    INTERVAL HISTORY:  Ms. Longhi 73 y.o. female Libby Maw today for follow-up.  She reports overall doing well.  She denies any changes in her health history.  She reports mild to moderate fatigue.  She is currently on anastrozole, tolerating well.  She denies any changes in bowel habits.  No weight loss.  Appetite is stable.  She denies any changes in her breast.  No new lumps, masses, nipple inversion or discharge.    REVIEW OF SYSTEMS:  Review of Systems  Constitutional: Positive for fatigue.  HENT:  Negative.   Eyes: Negative.   Respiratory: Negative.   Cardiovascular: Negative.   Gastrointestinal: Negative.   Endocrine: Negative.   Genitourinary: Negative.    Musculoskeletal: Positive for arthralgias and myalgias.  Skin: Negative.   Neurological: Negative.   Hematological: Negative.   Psychiatric/Behavioral: Negative.      PAST MEDICAL/SURGICAL HISTORY:  Past Medical History:  Diagnosis Date  . Blood transfusion   . Cancer ([place])   . Chronic kidney disease   . COPD (chronic obstructive pulmonary disease) ([place])   . Diabetes mellitus   . Dysphagia   . GERD (gastroesophageal reflux  disease)   . High cholesterol   . Hypertension   . Shortness of breath    Past Surgical History:  Procedure Laterality Date  . BREAST SURGERY    . COLONOSCOPY N/A 03/28/2017   Procedure: COLONOSCOPY;  Surgeon: Rogene Houston, MD;  Location: [facility];  Service: Endoscopy;  Laterality: N/A;  1200  - moved to 9/5 @ 10:30  . ESOPHAGOGASTRODUODENOSCOPY (EGD) WITH ESOPHAGEAL DILATION  11/2012   [facility]  . MASTECTOMY  right side  . MASTECTOMY MODIFIED RADICAL Left 12/06/2016   Procedure: MASTECTOMY MODIFIED RADICAL;  Surgeon: [name], MD;  Location: [facility];  Service: General;  Laterality: Left;  . TONSILLECTOMY       SOCIAL HISTORY:  Social History   Socioeconomic History  . Marital status: Single    Spouse name: Not on file  . Number of children: Not on file  . Years of education: Not on file  . Highest education level: Not on file  Occupational History  . Not on file  Social Needs  . Financial resource strain: Not on file  . Food insecurity    Worry: Not on file    Inability: Not on file  . Transportation needs    Medical: Not on file    Non-medical: Not on file  Tobacco Use  . Smoking status: Current Every Day Smoker    Packs/day: 1.00    Years: 53.00    Pack years: 53.00    Types: Cigarettes  . Smokeless tobacco: Never Used  Substance and Sexual Activity  . Alcohol use: No  . Drug use: No  . Sexual activity: Yes  Lifestyle  . Physical activity    Days per week: Not on file    Minutes per session: Not on file  . Stress: Not on file  Relationships  . Social Herbalist on phone: Not on file    Gets together: Not on file    Attends religious service: Not on file    Active member of club or organization: Not on file    Attends meetings of clubs or organizations: Not on file    Relationship status: Not on file  . Intimate partner violence    Fear of current or ex partner: Not on file    Emotionally abused: Not on file    Physically abused: Not on file    Forced sexual activity: Not on file  Other Topics Concern  . Not on file  Social History Narrative  . Not on file    FAMILY HISTORY:  Family History  Problem Relation Age of Onset  . Hypertension Mother   . Kidney disease Mother   . Emphysema Father   . Diabetes Brother   . COPD Brother    . Heart attack Brother     CURRENT MEDICATIONS:  Outpatient Encounter Medications as of 05/20/2019  Medication Sig  . anastrozole (ARIMIDEX) 1 MG tablet TAKE 1 TABLET BY MOUTH ONCE DAILY. (Patient taking differently: Take 1 mg by mouth daily. Pt takes twice a week per pt.)  . aspirin EC 81 MG tablet Take 81 mg by mouth daily after breakfast.   . beclomethasone (QVAR) 40 MCG/ACT inhaler Inhale 1 puff into the lungs daily.   . benazepril (LOTENSIN) 20 MG tablet Take 20 mg by mouth daily after breakfast.   . calcium-vitamin D (OSCAL WITH D) 500-200 MG-UNIT TABS tablet TAKE 2 TABLETS BY MOUTH IN THE MORNING WITH BREAKFAST.  Marland Kitchen docusate sodium (COLACE)  100 MG capsule Take 200 mg by mouth daily after breakfast.  . FEROSUL 325 (65 Fe) MG tablet Take 325 mg by mouth daily.   Marland Kitchen FLOVENT HFA 220 MCG/ACT inhaler Inhale 2 puffs into the lungs as needed.   . gabapentin (NEURONTIN) 300 MG capsule Take 300 mg by mouth at bedtime.   . Iron-Vitamin C (VITRON-C PO) Take 1 tablet by mouth daily after breakfast.  . lovastatin (MEVACOR) 20 MG tablet Take 20 mg by mouth at bedtime.  . metFORMIN (GLUCOPHAGE) 500 MG tablet Take 500 mg by mouth daily after breakfast.   . Multiple Vitamins-Minerals (GNP CENTURY ADULTS 50+ SENIOR) TABS Take by mouth daily.   Marland Kitchen omeprazole (PRILOSEC) 20 MG capsule Take 20 mg by mouth daily before breakfast.   . ONE TOUCH ULTRA TEST test strip   . SPIRIVA RESPIMAT 2.5 MCG/ACT AERS daily.   Marland Kitchen albuterol (PROVENTIL HFA;VENTOLIN HFA) 108 (90 BASE) MCG/ACT inhaler Inhale 1-2 puffs into the lungs every 6 (six) hours as needed for wheezing or shortness of breath.  . diphenhydrAMINE (BENADRYL) 25 MG tablet Take 25 mg by mouth every 6 (six) hours as needed (for sinus/allergies.).  Marland Kitchen fluticasone (FLONASE) 50 MCG/ACT nasal spray Place 2 sprays into both nostrils as needed.   . Fluticasone-Salmeterol (ADVAIR) 250-50 MCG/DOSE AEPB Inhale 1 puff into the lungs as needed.   . hydrOXYzine  (ATARAX/VISTARIL) 25 MG tablet Take 25 mg by mouth every 8 (eight) hours as needed for itching.  Marland Kitchen ibuprofen (ADVIL) 600 MG tablet Take 600 mg by mouth as needed.   . naproxen (NAPROSYN) 500 MG tablet Take 500 mg by mouth every 12 (twelve) hours as needed (for pain.).   . [DISCONTINUED] anastrozole (ARIMIDEX) 1 MG tablet TAKE 1 TABLET BY MOUTH ONCE DAILY.   Facility-Administered Encounter Medications as of 05/20/2019  Medication  . gadobenate dimeglumine (MULTIHANCE) injection 14 mL  . [COMPLETED] influenza vaccine adjuvanted (FLUAD) injection 0.5 mL    ALLERGIES:  No Known Allergies   PHYSICAL EXAM:  ECOG Performance status: 1  Vitals:   05/20/19 1405  BP: (!) 128/55  Pulse: (!) 103  Resp: 16  Temp: (!) 97.3 F (36.3 C)  SpO2: 96%   Filed Weights   05/20/19 1405  Weight: 141 lb (64 kg)    Physical Exam Constitutional:      Appearance: Normal appearance. She is obese.  HENT:     Head: Normocephalic.     Right Ear: External ear normal.     Left Ear: External ear normal.     Nose: Nose normal.     Mouth/Throat:     Pharynx: Oropharynx is clear.  Eyes:     Conjunctiva/sclera: Conjunctivae normal.  Cardiovascular:     Rate and Rhythm: Normal rate and regular rhythm.     Pulses: Normal pulses.     Heart sounds: Normal heart sounds.  Pulmonary:     Effort: Pulmonary effort is normal.     Breath sounds: Normal breath sounds.  Abdominal:     General: Bowel sounds are normal.  Musculoskeletal:        General: Normal range of motion.     Cervical back: Normal range of motion.  Skin:    General: Skin is warm.  Neurological:     General: No focal deficit present.     Mental Status: She is alert and oriented to person, place, and time.  Psychiatric:        Mood and Affect: Mood normal.  Behavior: Behavior normal.      LABORATORY DATA:  I have reviewed the labs as listed.  CBC    Component Value Date/Time   WBC 5.0 05/13/2019 0952   RBC 4.24  05/13/2019 0952   HGB 12.5 05/13/2019 0952   HCT 39.5 05/13/2019 0952   PLT 269 05/13/2019 0952   MCV 93.2 05/13/2019 0952   MCH 29.5 05/13/2019 0952   MCHC 31.6 05/13/2019 0952   RDW 12.8 05/13/2019 0952   LYMPHSABS 2.0 05/13/2019 0952   MONOABS 0.3 05/13/2019 0952   EOSABS 0.1 05/13/2019 0952   BASOSABS 0.0 05/13/2019 0952   CMP Latest Ref Rng & Units 05/13/2019 11/12/2018 05/08/2018  Glucose 70 - 99 mg/dL 175(H) 131(H) 237(H)  BUN 8 - 23 mg/dL '10 8 9  ' Creatinine 0.44 - 1.00 mg/dL 0.90 0.90 0.99  Sodium 135 - 145 mmol/L 142 140 140  Potassium 3.5 - 5.1 mmol/L 3.6 3.4(L) 3.6  Chloride 98 - 111 mmol/L 106 105 105  CO2 22 - 32 mmol/L '26 24 26  ' Calcium 8.9 - 10.3 mg/dL 10.0 9.8 9.5  Total Protein 6.5 - 8.1 g/dL 7.8 7.8 7.7  Total Bilirubin 0.3 - 1.2 mg/dL 0.3 0.4 0.4  Alkaline Phos 38 - 126 U/L 83 91 85  AST 15 - 41 U/L '17 21 25  ' ALT 0 - 44 U/L '11 14 21        ' ASSESSMENT & PLAN:   Breast cancer, left ([facility]) 1.  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 - She has a remote history of right breast cancer in 2006, status post right mastectomy treated at [facility]. -She is tolerating her anastrozole with no side effects. -Physical examination today was within normal limits. -Labs on 05/13/2019: CA 15-3; 6.6.  All other labs were acceptable. -We will follow her in 6 months with repeat labs.  sHe may continue with annual mammograms.  2.  Bone density: -Her last DEXA scan was on 01/2017. -She had a T score of -1.0 which was normal. -DEXA: 05/13/19: Tscore: -1.3.  -She will continue on vitamin D and calcium supplementation.  3.  Tobacco abuse -Patient has a history of tobacco abuse of greater than 30 years, she is over the age of 84.  Have recommended low-dose CT screening for patient.      Orders  placed this encounter:  Orders Placed This Encounter  Procedures  . CT CHEST LUNG CA SCREEN LOW DOSE W/O CM     [place] [phone]

## 2019-05-27 ENCOUNTER — Ambulatory Visit (HOSPITAL_COMMUNITY): Admission: RE | Admit: 2019-05-27 | Payer: Medicare Other | Source: Ambulatory Visit

## 2019-05-27 ENCOUNTER — Ambulatory Visit (HOSPITAL_COMMUNITY): Payer: Medicare Other | Admitting: Hematology

## 2019-05-31 ENCOUNTER — Telehealth (HOSPITAL_COMMUNITY): Payer: Self-pay

## 2019-05-31 ENCOUNTER — Other Ambulatory Visit: Payer: Self-pay

## 2019-05-31 ENCOUNTER — Inpatient Hospital Stay (HOSPITAL_COMMUNITY): Payer: Medicare Other | Attending: Hematology | Admitting: Hematology

## 2019-05-31 NOTE — Telephone Encounter (Signed)
Patient had a phone visit scheduled on 05/31/2019 with Reynolds Bowl, NP.  I tried contacting patient with no answer x 3.

## 2019-07-13 NOTE — Assessment & Plan Note (Addendum)
1.  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 - She has a remote history of right breast cancer in 2006, status post right mastectomy treated at [facility]. -She is tolerating her anastrozole with no side effects. -Physical examination today was within normal limits. -Labs on 05/13/2019: CA 15-3; 6.6.  All other labs were acceptable. -We will follow her in 6 months with repeat labs.  sHe may continue with annual mammograms.  2.  Bone density: -Her last DEXA scan was on 01/2017. -She had a T score of -1.0 which was normal. -DEXA: 05/13/19: Tscore: -1.3.  -She will continue on vitamin D and calcium supplementation.  3.  Tobacco abuse -Patient has a history of tobacco abuse of greater than 30 years, she is over the age of 50.  Have recommended low-dose CT screening for patient.

## 2019-07-19 ENCOUNTER — Other Ambulatory Visit (HOSPITAL_COMMUNITY): Payer: Self-pay | Admitting: Nurse Practitioner

## 2019-07-19 DIAGNOSIS — C50012 Malignant neoplasm of nipple and areola, left female breast: Secondary | ICD-10-CM

## 2019-11-10 ENCOUNTER — Other Ambulatory Visit (HOSPITAL_COMMUNITY): Payer: Self-pay | Admitting: *Deleted

## 2019-11-10 DIAGNOSIS — Z170 Estrogen receptor positive status [ER+]: Secondary | ICD-10-CM

## 2019-11-10 DIAGNOSIS — Z0000 Encounter for general adult medical examination without abnormal findings: Secondary | ICD-10-CM

## 2019-11-10 DIAGNOSIS — C50412 Malignant neoplasm of upper-outer quadrant of left female breast: Secondary | ICD-10-CM

## 2019-11-11 ENCOUNTER — Inpatient Hospital Stay (HOSPITAL_COMMUNITY): Payer: Medicare HMO | Attending: Hematology

## 2019-11-18 ENCOUNTER — Ambulatory Visit (HOSPITAL_COMMUNITY): Payer: Medicare Other | Admitting: Nurse Practitioner

## 2019-11-26 ENCOUNTER — Inpatient Hospital Stay (HOSPITAL_COMMUNITY): Payer: Medicare HMO | Attending: Hematology

## 2019-12-03 ENCOUNTER — Ambulatory Visit (HOSPITAL_COMMUNITY): Payer: Medicare HMO | Admitting: Nurse Practitioner

## 2019-12-30 ENCOUNTER — Other Ambulatory Visit: Payer: Self-pay

## 2019-12-30 ENCOUNTER — Encounter (HOSPITAL_COMMUNITY): Payer: Self-pay | Admitting: Emergency Medicine

## 2019-12-30 DIAGNOSIS — E1122 Type 2 diabetes mellitus with diabetic chronic kidney disease: Secondary | ICD-10-CM | POA: Insufficient documentation

## 2019-12-30 DIAGNOSIS — M7989 Other specified soft tissue disorders: Secondary | ICD-10-CM | POA: Diagnosis not present

## 2019-12-30 DIAGNOSIS — I129 Hypertensive chronic kidney disease with stage 1 through stage 4 chronic kidney disease, or unspecified chronic kidney disease: Secondary | ICD-10-CM | POA: Insufficient documentation

## 2019-12-30 DIAGNOSIS — R05 Cough: Secondary | ICD-10-CM | POA: Diagnosis not present

## 2019-12-30 DIAGNOSIS — J449 Chronic obstructive pulmonary disease, unspecified: Secondary | ICD-10-CM | POA: Insufficient documentation

## 2019-12-30 DIAGNOSIS — Z7982 Long term (current) use of aspirin: Secondary | ICD-10-CM | POA: Insufficient documentation

## 2019-12-30 DIAGNOSIS — F17210 Nicotine dependence, cigarettes, uncomplicated: Secondary | ICD-10-CM | POA: Insufficient documentation

## 2019-12-30 DIAGNOSIS — N189 Chronic kidney disease, unspecified: Secondary | ICD-10-CM | POA: Diagnosis not present

## 2019-12-30 DIAGNOSIS — Z7984 Long term (current) use of oral hypoglycemic drugs: Secondary | ICD-10-CM | POA: Diagnosis not present

## 2019-12-30 DIAGNOSIS — M25572 Pain in left ankle and joints of left foot: Secondary | ICD-10-CM | POA: Insufficient documentation

## 2019-12-30 NOTE — ED Triage Notes (Signed)
Patient states left foot pain that started 3 days ago. Patient denies any recent injury.

## 2019-12-31 ENCOUNTER — Emergency Department (HOSPITAL_COMMUNITY): Payer: Medicare Other

## 2019-12-31 ENCOUNTER — Emergency Department (HOSPITAL_COMMUNITY)
Admission: EM | Admit: 2019-12-31 | Discharge: 2019-12-31 | Disposition: A | Discharge status: Home / Self Care | Payer: Medicare Other | Attending: Emergency Medicine | Admitting: Emergency Medicine

## 2019-12-31 DIAGNOSIS — M25572 Pain in left ankle and joints of left foot: Secondary | ICD-10-CM | POA: Diagnosis not present

## 2019-12-31 DIAGNOSIS — M79672 Pain in left foot: Secondary | ICD-10-CM

## 2019-12-31 IMAGING — DX DG FOOT COMPLETE 3+V*L*
3 series · 3 of 3 positions shown · non-contrast
Comparison: None.

CLINICAL DATA: Left foot pain that started 3 days ago

EXAM:
LEFT FOOT - COMPLETE 3+ VIEW

[foot ap]
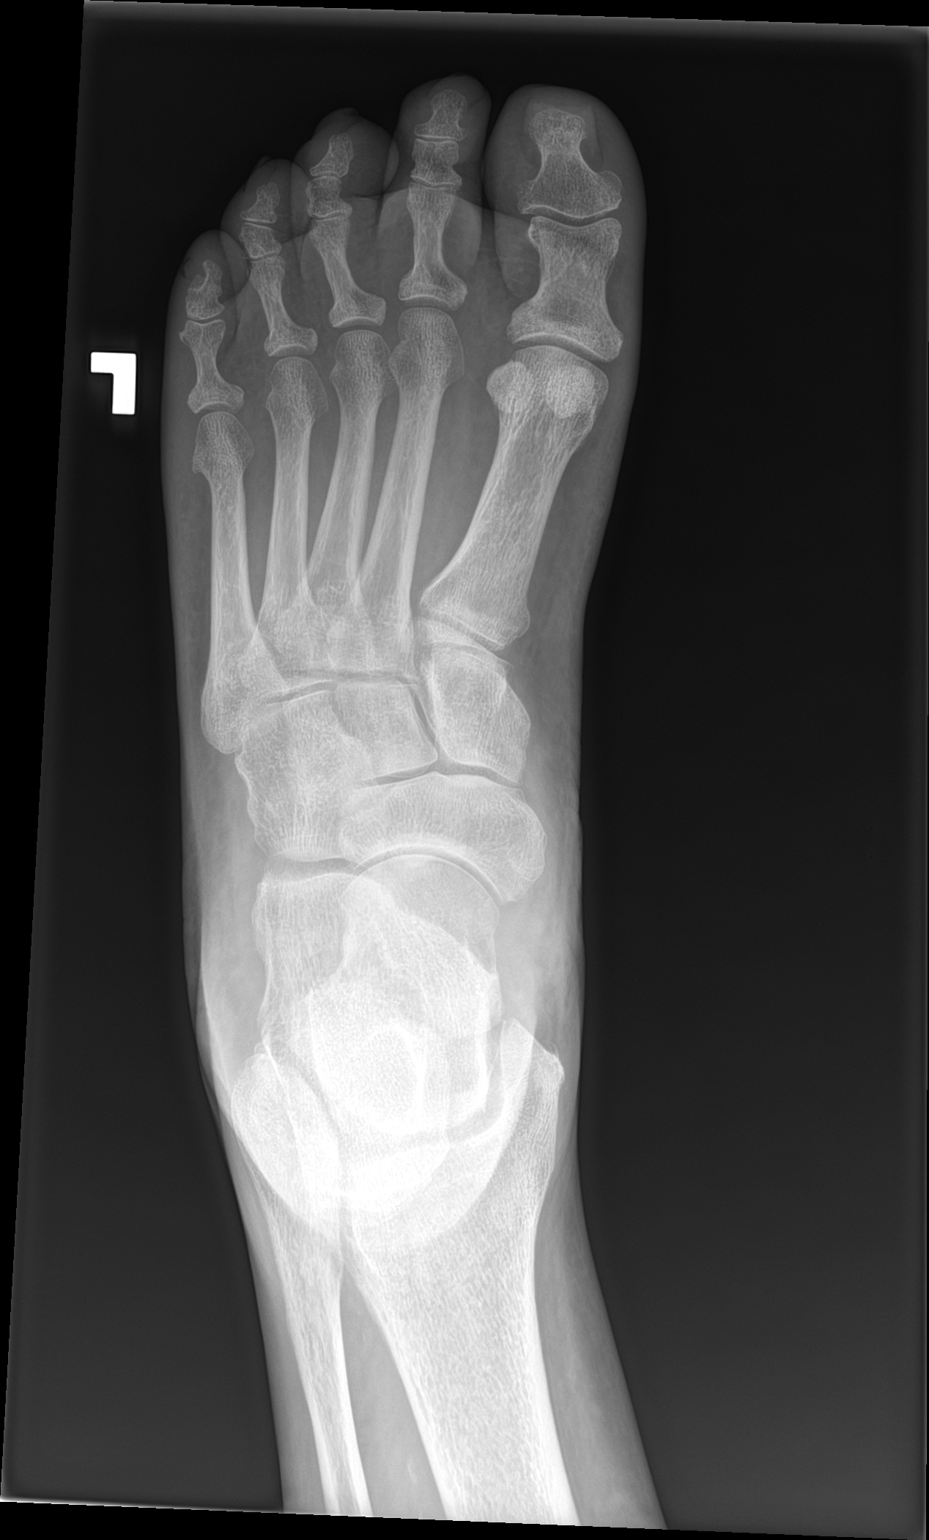

[foot obl]
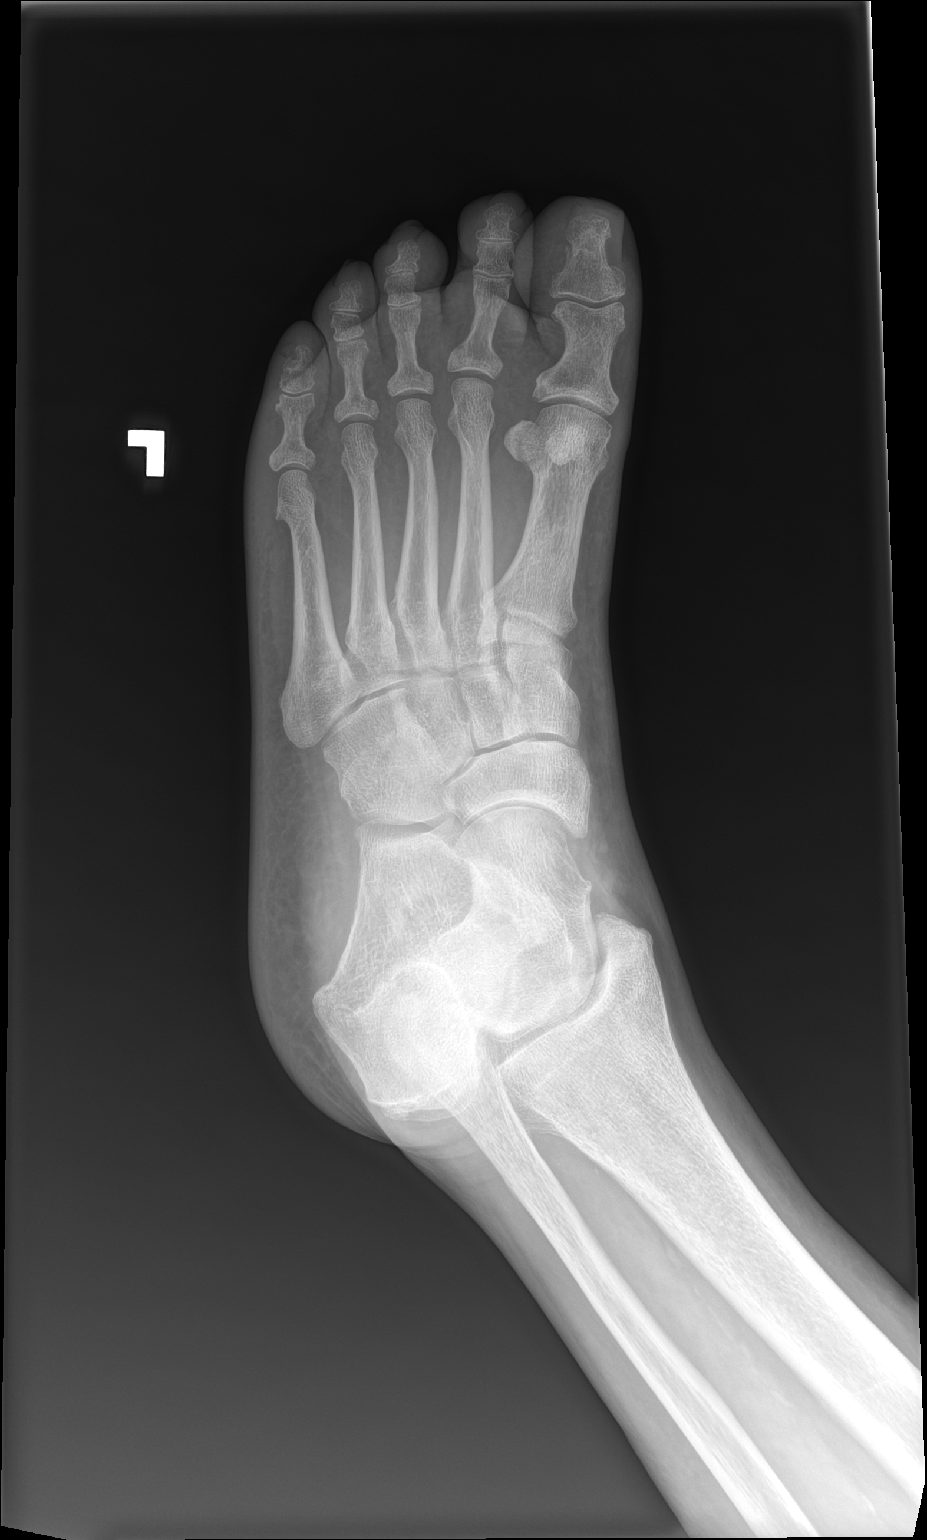

[foot lat]
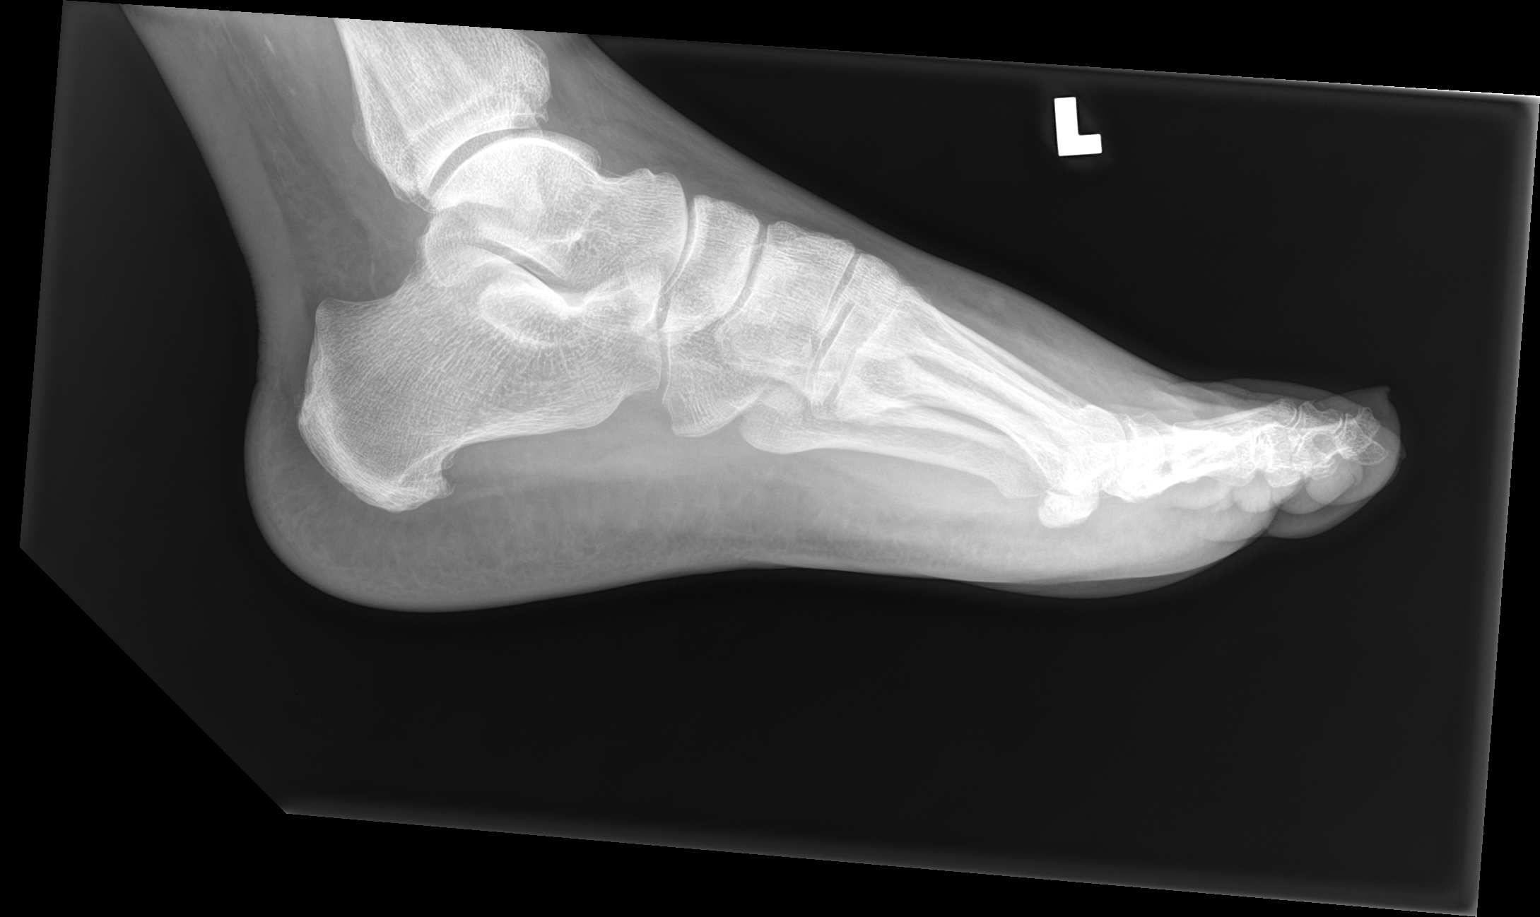

[3 of 3 positions shown; findings below may reference images not displayed]

FINDINGS: No soft tissue gas or foreign body.  No fracture or erosion.
IMPRESSION: No acute or focal finding.

## 2019-12-31 MED ORDER — HYDROCODONE-ACETAMINOPHEN 5-325 MG PO TABS
1.0000 | ORAL_TABLET | Freq: Once | ORAL | Status: AC
  Administered 2019-12-31: 1 via ORAL
  Filled 2019-12-31: qty 1

## 2019-12-31 MED ORDER — ALBUTEROL SULFATE HFA 108 (90 BASE) MCG/ACT IN AERS
2.0000 | INHALATION_SPRAY | Freq: Once | RESPIRATORY_TRACT | Status: AC
  Administered 2019-12-31: 2 via RESPIRATORY_TRACT
  Filled 2019-12-31: qty 6.7

## 2019-12-31 MED ORDER — PREDNISONE 50 MG PO TABS
ORAL_TABLET | ORAL | 0 refills | Status: DC
Start: 2019-12-31 — End: 2020-06-13

## 2019-12-31 MED ORDER — PREDNISONE 50 MG PO TABS
60.0000 mg | ORAL_TABLET | Freq: Once | ORAL | Status: AC
  Administered 2019-12-31: 60 mg via ORAL
  Filled 2019-12-31: qty 1

## 2019-12-31 NOTE — ED Provider Notes (Signed)
[facility] EMERGENCY DEPARTMENT Provider Note   CSN: [id number] Arrival date & time: 12/30/19  2020     History Chief Complaint  Patient presents with  . Left foot pain    Michelle Henry is a 74 y.o. female.  The history is provided by the patient.  Foot Pain This is a new problem. The current episode started more than 2 days ago. The problem occurs daily. The problem has been gradually worsening. Pertinent negatives include no chest pain and no abdominal pain. The symptoms are aggravated by walking. The symptoms are relieved by rest.  Patient history of COPD, diabetes presents with left foot pain.  Patient reports she has a history of frequent falls but denies any known injury to her foot.  She reports over the past 3 days she has been having pain and swelling of the foot.  No other traumatic injuries.  She does report mild cough from her COPD.  She thinks she has a history of gout in the past     Past Medical History:  Diagnosis Date  . Blood transfusion   . Cancer ([place])   . Chronic kidney disease   . COPD (chronic obstructive pulmonary disease) ([place])   . Diabetes mellitus   . Dysphagia   . GERD (gastroesophageal reflux disease)   . High cholesterol   . Hypertension   . Shortness of breath     Patient Active Problem List   Diagnosis Date Noted  . Breast cancer, left breast ([place]) 12/06/2016  . Breast cancer, left ([place]) 10/31/2016  . History of colonic polyps 03/20/2016  . SBO (small bowel obstruction) ([place]) 03/01/2011  . HTN (hypertension) 03/01/2011  . DM2 (diabetes mellitus, type 2) ([place]) 03/01/2011  . Hyperlipemia 03/01/2011  . Tobacco abuse 03/01/2011  . GERD (gastroesophageal reflux disease) 03/01/2011    Past Surgical History:  Procedure Laterality Date  . BREAST SURGERY    . COLONOSCOPY N/A 03/28/2017   Procedure: COLONOSCOPY;  Surgeon: Rogene Houston, MD;  Location: [facility] ENDO SUITE;  Service: Endoscopy;  Laterality: N/A;  1200 - moved to 9/5 @ 10:30  .  ESOPHAGOGASTRODUODENOSCOPY (EGD) WITH ESOPHAGEAL DILATION  11/2012   [facility]  . MASTECTOMY  right side  . MASTECTOMY MODIFIED RADICAL Left 12/06/2016   Procedure: MASTECTOMY MODIFIED RADICAL;  Surgeon: [name], MD;  Location: [facility] ORS;  Service: General;  Laterality: Left;  . TONSILLECTOMY       OB History   No obstetric history on file.     Family History  Problem Relation Age of Onset  . Hypertension Mother   . Kidney disease Mother   . Emphysema Father   . Diabetes Brother   . COPD Brother   . Heart attack Brother     Social History   Tobacco Use  . Smoking status: Current Every Day Smoker    Packs/day: 1.00    Years: 53.00    Pack years: 53.00    Types: Cigarettes  . Smokeless tobacco: Never Used  Substance Use Topics  . Alcohol use: No  . Drug use: No    Home Medications Prior to Admission medications   Medication Sig Start Date End Date Taking? Authorizing Provider  albuterol (PROVENTIL HFA;VENTOLIN HFA) 108 (90 BASE) MCG/ACT inhaler Inhale 1-2 puffs into the lungs every 6 (six) hours as needed for wheezing or shortness of breath.    [provider]  anastrozole (ARIMIDEX) 1 MG tablet TAKE 1 TABLET BY MOUTH ONCE DAILY. Patient taking differently: Take  1 mg by mouth daily. Pt takes twice a week per pt. 02/26/19   Derek Jack, MD  aspirin EC 81 MG tablet Take 81 mg by mouth daily after breakfast.     [provider]  beclomethasone (QVAR) 40 MCG/ACT inhaler Inhale 1 puff into the lungs daily.     [provider]  benazepril (LOTENSIN) 20 MG tablet Take 20 mg by mouth daily after breakfast.     [provider]  calcium-vitamin D (OSCAL WITH D) 500-200 MG-UNIT TABS tablet TAKE 2 TABLETS BY MOUTH IN THE MORNING WITH BREAKFAST. 07/20/19   Lockamy, Randi L, NP-C  diphenhydrAMINE (BENADRYL) 25 MG tablet Take 25 mg by mouth every 6 (six) hours as needed (for sinus/allergies.).    [provider]  docusate sodium (COLACE)  100 MG capsule Take 200 mg by mouth daily after breakfast.    [provider]  FEROSUL 325 (65 Fe) MG tablet Take 325 mg by mouth daily.  09/28/18   [provider]  FLOVENT HFA 220 MCG/ACT inhaler Inhale 2 puffs into the lungs as needed.  11/06/17   [provider]  fluticasone (FLONASE) 50 MCG/ACT nasal spray Place 2 sprays into both nostrils as needed.  10/16/18   [provider]  Fluticasone-Salmeterol (ADVAIR) 250-50 MCG/DOSE AEPB Inhale 1 puff into the lungs as needed.  03/18/18   [provider]  gabapentin (NEURONTIN) 300 MG capsule Take 300 mg by mouth at bedtime.  09/21/18   [provider]  hydrOXYzine (ATARAX/VISTARIL) 25 MG tablet Take 25 mg by mouth every 8 (eight) hours as needed for itching. 10/16/16   [provider]  ibuprofen (ADVIL) 600 MG tablet Take 600 mg by mouth as needed.  11/17/18   [provider]  Iron-Vitamin C (VITRON-C PO) Take 1 tablet by mouth daily after breakfast.    [provider]  lovastatin (MEVACOR) 20 MG tablet Take 20 mg by mouth at bedtime.    [provider]  metFORMIN (GLUCOPHAGE) 500 MG tablet Take 500 mg by mouth daily after breakfast.  07/27/15   [provider]  Multiple Vitamins-Minerals [facility] CENTURY ADULTS 50+ SENIOR) TABS Take by mouth daily.  02/03/19   [provider]  naproxen (NAPROSYN) 500 MG tablet Take 500 mg by mouth every 12 (twelve) hours as needed (for pain.).     [provider]  omeprazole (PRILOSEC) 20 MG capsule Take 20 mg by mouth daily before breakfast.     [provider]  ONE TOUCH ULTRA TEST test strip  09/10/17   [provider]  SPIRIVA RESPIMAT 2.5 MCG/ACT AERS daily.  05/07/18   [provider]  anastrozole (ARIMIDEX) 1 MG tablet TAKE 1 TABLET BY MOUTH ONCE DAILY. 12/02/18   Derek Jack, MD    Allergies    Patient has no known allergies.  Review of Systems   Review of Systems    Constitutional: Negative for fever.  Respiratory: Positive for cough.   Cardiovascular: Negative for chest pain.  Gastrointestinal: Negative for abdominal pain.  Musculoskeletal: Positive for arthralgias and joint swelling.  All other systems reviewed and are negative.   Physical Exam Updated Vital Signs BP (!) 164/71   Pulse 89   Temp 98.6 F (37 C) (Oral)   Resp 15   Ht 1.676 m (5\' 6" )   Wt 81.6 kg   SpO2 98%   BMI 29.05 kg/m   Physical Exam  CONSTITUTIONAL: Elderly, no acute distress HEAD: Normocephalic/atraumatic EYES: EOMI ENMT:  Mucous membranes moist NECK: supple no meningeal signs SPINE/BACK:entire spine nontender, no bruising/crepitance/stepoffs noted to spine CV: S1/S2 noted, no murmurs/rubs/gallops noted LUNGS: Scattered wheezing bilaterally no apparent distress ABDOMEN: soft, nontender NEURO: Pt is awake/alert/appropriate, moves all extremitiesx4.  No facial droop.  EXTREMITIES: pulses normal/equal, full ROM, tenderness noted to dorsal aspect of left foot.  Mild swelling noted.  No erythema.  No evidence of cellulitis, no signs of abscess.  No crepitus.  Distal pulses intact.  No puncture wounds noted to the dorsal or plantar surface of the foot SKIN: warm, color normal PSYCH: no abnormalities of mood noted, alert and oriented to situation  ED Results / Procedures / Treatments   Labs (all labs ordered are listed, but only abnormal results are displayed) Labs Reviewed - No data to display  EKG None  Radiology DG Foot Complete Left  Result Date: 12/31/2019 CLINICAL DATA:  Left foot pain that started 3 days ago EXAM: LEFT FOOT - COMPLETE 3+ VIEW COMPARISON:  None. FINDINGS: No soft tissue gas or foreign body.  No fracture or erosion. IMPRESSION: No acute or focal finding. Electronically Signed   By: Monte Fantasia M.D.   On: 12/31/2019 04:11    Procedures Procedures   Medications Ordered in ED Medications  predniSONE (DELTASONE) tablet 60 mg (60 mg  Oral Given 12/31/19 0350)  albuterol (VENTOLIN HFA) 108 (90 Base) MCG/ACT inhaler 2 puff (2 puffs Inhalation Given 12/31/19 0350)  HYDROcodone-acetaminophen (NORCO/VICODIN) 5-325 MG per tablet 1 tablet (1 tablet Oral Given 12/31/19 0350)    ED Course  I have reviewed the triage vital signs and the nursing notes.  Pertinent  imaging results that were available during my care of the patient were reviewed by me and considered in my medical decision making (see chart for details).    MDM Rules/Calculators/A&P                      She presents for left foot pain.  For this is suspected could be related to gout.  No signs of traumatic injury.  No signs of cellulitis or infectious etiology.  She can fully range her left ankle Will place patient on steroids.  She also had mild cough, albuterol was given.  Otherwise she is in no distress. Patient reports recent falls at home but no traumatic injuries.  No signs of any head injury.  Patient reports she would welcome home health.  Final Clinical Impression(s) / ED Diagnoses Final diagnoses:  Left foot pain    Rx / DC Orders ED Discharge Orders         Ordered    predniSONE (DELTASONE) 50 MG tablet     12/31/19 5170           Ripley Fraise, MD 12/31/19 [phone]

## 2019-12-31 NOTE — Discharge Instructions (Addendum)
Keep the foot elevated.  Take the steroids daily.  If no improvement next week please follow-up with your primary doctor

## 2020-02-23 ENCOUNTER — Ambulatory Visit (HOSPITAL_COMMUNITY): Payer: Medicare Other | Admitting: Nurse Practitioner

## 2020-02-23 ENCOUNTER — Inpatient Hospital Stay (HOSPITAL_COMMUNITY): Payer: Medicaid Other | Attending: Hematology

## 2020-02-23 NOTE — Assessment & Plan Note (Deleted)
1.  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 - She has a remote history of right breast cancer in 2006, status post right mastectomy treated at [facility]. -She is tolerating her anastrozole with no side effects. -Physical examination today was within normal limits. -Labs on 11/12/2018 showed CA 15-3 was 6.9.  All other labs were within normal limits. -We will follow her in 6 months with repeat labs.  2.  Bone density: -Her last DEXA scan was on 01/2017. -She had a T score of -1.0 which was normal. -We will follow her in 2 to 3 years. -She is taking calcium and vitamin D daily. -We will repeat a DEXA scan on her next visit in 6 months.  3.  Tobacco abuse: -Patient has a history of tobacco abuse greater than 30 years.  She is over the age of 28. -We have recommended a low-dose CT screening.

## 2020-03-02 ENCOUNTER — Ambulatory Visit (HOSPITAL_COMMUNITY): Payer: Medicaid Other | Admitting: Nurse Practitioner

## 2020-03-02 ENCOUNTER — Other Ambulatory Visit (HOSPITAL_COMMUNITY): Payer: Medicaid Other

## 2020-05-19 DIAGNOSIS — R413 Other amnesia: Secondary | ICD-10-CM | POA: Insufficient documentation

## 2020-05-19 DIAGNOSIS — R410 Disorientation, unspecified: Secondary | ICD-10-CM | POA: Insufficient documentation

## 2020-06-13 ENCOUNTER — Ambulatory Visit: Payer: Medicare HMO | Admitting: Infectious Diseases

## 2020-06-13 ENCOUNTER — Encounter: Payer: Self-pay | Admitting: Infectious Diseases

## 2020-06-13 ENCOUNTER — Ambulatory Visit: Payer: Medicare HMO | Attending: Infectious Diseases | Admitting: Infectious Diseases

## 2020-06-13 ENCOUNTER — Other Ambulatory Visit
Admission: RE | Admit: 2020-06-13 | Discharge: 2020-06-13 | Disposition: A | Discharge status: Home / Self Care | Payer: Medicare HMO | Attending: Infectious Diseases | Admitting: Infectious Diseases

## 2020-06-13 VITALS — BP 130/77 | HR 110 | Temp 98.0°F | Resp 16 | Ht 66.0 in | Wt 183.0 lb

## 2020-06-13 DIAGNOSIS — R413 Other amnesia: Secondary | ICD-10-CM | POA: Diagnosis not present

## 2020-06-13 DIAGNOSIS — N189 Chronic kidney disease, unspecified: Secondary | ICD-10-CM | POA: Diagnosis not present

## 2020-06-13 DIAGNOSIS — A539 Syphilis, unspecified: Secondary | ICD-10-CM

## 2020-06-13 DIAGNOSIS — R4189 Other symptoms and signs involving cognitive functions and awareness: Secondary | ICD-10-CM | POA: Diagnosis present

## 2020-06-13 DIAGNOSIS — F17210 Nicotine dependence, cigarettes, uncomplicated: Secondary | ICD-10-CM | POA: Diagnosis not present

## 2020-06-13 DIAGNOSIS — E1122 Type 2 diabetes mellitus with diabetic chronic kidney disease: Secondary | ICD-10-CM | POA: Insufficient documentation

## 2020-06-13 DIAGNOSIS — R29818 Other symptoms and signs involving the nervous system: Secondary | ICD-10-CM | POA: Insufficient documentation

## 2020-06-13 DIAGNOSIS — Z7984 Long term (current) use of oral hypoglycemic drugs: Secondary | ICD-10-CM | POA: Diagnosis not present

## 2020-06-13 DIAGNOSIS — J449 Chronic obstructive pulmonary disease, unspecified: Secondary | ICD-10-CM | POA: Diagnosis not present

## 2020-06-13 DIAGNOSIS — Z79899 Other long term (current) drug therapy: Secondary | ICD-10-CM | POA: Diagnosis not present

## 2020-06-13 DIAGNOSIS — I129 Hypertensive chronic kidney disease with stage 1 through stage 4 chronic kidney disease, or unspecified chronic kidney disease: Secondary | ICD-10-CM | POA: Diagnosis not present

## 2020-06-13 LAB — SEDIMENTATION RATE: Sed Rate: 56 mm/hr — ABNORMAL HIGH (ref 0–30)

## 2020-06-13 NOTE — Patient Instructions (Signed)
You are here referred by your neurologist for a positive treponemal test. 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

## 2020-06-13 NOTE — Progress Notes (Signed)
NAME: Michelle Henry  DOB: October 21, 1945  MRN: [id number]  Date/Time: 06/13/2020 9:46 AM  REQUESTING PROVIDER:Ashley Stepp Subjective:  REASON FOR CONSULT: positive syphilis test ?sister in law accompanied her to the visit Michelle Henry is a 74 y.o. female is referred to me by neurology for a positive treponemal test. She has h/o  Rt breast cancer s/p mastectomy, left breast cancer grade 1 ductal carcinoma, status post modified radical mastectomy plus lymph node dissection and currently on anastrozole Pt went to neurology for evaluation of memory loss and mild cognitive impairment. As part of the work up on 05/19/20 RPR, B12, vitamin D, thiamine, folate, TSH A1c were done TPA was positive and RPR was negative and  . ( reverse test) B12 was low at 198. Pt says 26 yrs ago when she was in [place] she had a possitive RPR test and was treated for  syphilis with 1 dose of penicillin( given in both buttocks). As per sister in law she is slow to walk , some memory issues Patient is currently at [place] [facility] assisted living facility. Pt has pain back of her legs going down  No difficulty with eye visit, no double vision    Past Medical History:  Diagnosis Date  . Blood transfusion   . Cancer ([place])   . Chronic kidney disease   . COPD (chronic obstructive pulmonary disease) ([place])   . Diabetes mellitus   . Dysphagia   . GERD (gastroesophageal reflux disease)   . High cholesterol   . Hypertension   . Shortness of breath     Past Surgical History:  Procedure Laterality Date  . BREAST SURGERY    . COLONOSCOPY N/A 03/28/2017   Procedure: COLONOSCOPY;  Surgeon: Rogene Houston, MD;  Location: [facility] ENDO SUITE;  Service: Endoscopy;  Laterality: N/A;  1200 - moved to 9/5 @ 10:30  . ESOPHAGOGASTRODUODENOSCOPY (EGD) WITH ESOPHAGEAL DILATION  11/2012   [facility]  . MASTECTOMY  right side  . MASTECTOMY MODIFIED RADICAL Left 12/06/2016   Procedure: MASTECTOMY MODIFIED RADICAL;  Surgeon: [name], MD;   Location: [facility] ORS;  Service: General;  Laterality: Left;  . TONSILLECTOMY      Social History   Socioeconomic History  . Marital status: Single    Spouse name: Not on file  . Number of children: Not on file  . Years of education: Not on file  . Highest education level: Not on file  Occupational History  . Not on file  Tobacco Use  . Smoking status: Current Every Day Smoker    Packs/day: 1.00    Years: 53.00    Pack years: 53.00    Types: Cigarettes  . Smokeless tobacco: Never Used  Vaping Use  . Vaping Use: Never used  Substance and Sexual Activity  . Alcohol use: No  . Drug use: No  . Sexual activity: Yes  Other Topics Concern  . Not on file  Social History Narrative  . Not on file   Social Determinants of Health   Financial Resource Strain:   . Difficulty of Paying Living Expenses: Not on file  Food Insecurity:   . Worried About Charity fundraiser in the Last Year: Not on file  . Ran Out of Food in the Last Year: Not on file  Transportation Needs:   . Lack of Transportation (Medical): Not on file  . Lack of Transportation (Non-Medical): Not on file  Physical Activity:   . Days of Exercise per Week: Not on  file  . Minutes of Exercise per Session: Not on file  Stress:   . Feeling of Stress : Not on file  Social Connections:   . Frequency of Communication with Friends and Family: Not on file  . Frequency of Social Gatherings with Friends and Family: Not on file  . Attends Religious Services: Not on file  . Active Member of Clubs or Organizations: Not on file  . Attends Archivist Meetings: Not on file  . Marital Status: Not on file  Intimate Partner Violence:   . Fear of Current or Ex-Partner: Not on file  . Emotionally Abused: Not on file  . Physically Abused: Not on file  . Sexually Abused: Not on file    Family History  Problem Relation Age of Onset  . Hypertension Mother   . Kidney disease Mother   . Emphysema Father   . Diabetes Brother    . COPD Brother   . Heart attack Brother    No Known Allergies  ? Current Outpatient Medications  Medication Sig Dispense Refill  . albuterol (PROVENTIL HFA;VENTOLIN HFA) 108 (90 BASE) MCG/ACT inhaler Inhale 1-2 puffs into the lungs every 6 (six) hours as needed for wheezing or shortness of breath.    . anastrozole (ARIMIDEX) 1 MG tablet TAKE 1 TABLET BY MOUTH ONCE DAILY. (Patient taking differently: Take 1 mg by mouth daily. Pt takes twice a week per pt.) 90 tablet 0  . aspirin EC 81 MG tablet Take 81 mg by mouth daily after breakfast.     . beclomethasone (QVAR) 40 MCG/ACT inhaler Inhale 1 puff into the lungs daily.     . benazepril (LOTENSIN) 20 MG tablet Take 20 mg by mouth daily after breakfast.     . calcium-vitamin D (OSCAL WITH D) 500-200 MG-UNIT TABS tablet TAKE 2 TABLETS BY MOUTH IN THE MORNING WITH BREAKFAST. 60 tablet 0  . diphenhydrAMINE (BENADRYL) 25 MG tablet Take 25 mg by mouth every 6 (six) hours as needed (for sinus/allergies.).    Marland Kitchen docusate sodium (COLACE) 100 MG capsule Take 200 mg by mouth daily after breakfast.    . FEROSUL 325 (65 Fe) MG tablet Take 325 mg by mouth daily.     Marland Kitchen FLOVENT HFA 220 MCG/ACT inhaler Inhale 2 puffs into the lungs as needed.     . fluticasone (FLONASE) 50 MCG/ACT nasal spray Place 2 sprays into both nostrils as needed.     . gabapentin (NEURONTIN) 300 MG capsule Take 300 mg by mouth at bedtime.     . hydrOXYzine (ATARAX/VISTARIL) 25 MG tablet Take 25 mg by mouth every 8 (eight) hours as needed for itching.    Marland Kitchen ibuprofen (ADVIL) 600 MG tablet Take 600 mg by mouth as needed.     . Iron-Vitamin C (VITRON-C PO) Take 1 tablet by mouth daily after breakfast.    . lovastatin (MEVACOR) 20 MG tablet Take 20 mg by mouth at bedtime.    . metFORMIN (GLUCOPHAGE) 500 MG tablet Take 500 mg by mouth daily after breakfast.     . Multiple Vitamins-Minerals ([facility] CENTURY ADULTS 50+ SENIOR) TABS Take by mouth daily.     . naproxen (NAPROSYN) 500 MG tablet Take  500 mg by mouth every 12 (twelve) hours as needed (for pain.).     Marland Kitchen omeprazole (PRILOSEC) 20 MG capsule Take 20 mg by mouth daily before breakfast.     . ONE TOUCH ULTRA TEST test strip     . SPIRIVA RESPIMAT 2.5 MCG/ACT  AERS daily.   11   No current facility-administered medications for this visit.   Facility-Administered Medications Ordered in Other Visits  Medication Dose Route Frequency Provider Last Rate Last Admin  . gadobenate dimeglumine (MULTIHANCE) injection 14 mL  14 mL Intravenous Once PRN Baucom, Sonia Baller B, PA-C         Abtx:  Anti-infectives (From admission, onward)   None      REVIEW OF SYSTEMS:  Const: negative fever, negative chills, negative weight loss Eyes: negative diplopia or visual changes, negative eye pain ENT: negative coryza, negative sore throat Resp: negative cough, hemoptysis, dyspnea Cards: negative for chest pain, palpitations, lower extremity edema GU: negative for frequency, dysuria and hematuria GI: Negative for abdominal pain, diarrhea, bleeding, constipation Skin: negative for rash and pruritus Heme: negative for easy bruising and gum/nose bleeding MS: negative for myalgias, arthralgias, back pain and muscle weakness Neurolo:, memory problems, slow walk, sleep okay Psych: negative for feelings of anxiety, depression  Endocrine: Has diabetes Allergy/Immunology- negative for any medication or food allergies  Objective:  VITALS:  BP 130/77   Pulse (!) 110   Temp 98 F (36.7 C) (Temporal)   Resp 16   Ht 5\' 6"  (1.676 m)   Wt 183 lb (83 kg)   BMI 29.54 kg/m  PHYSICAL EXAM:  General: Alert, cooperative, no distress, slow to respond Head: Normocephalic, without obvious abnormality, atraumatic. Eyes: Conjunctivae clear, anicteric sclerae. Pupils are equal ENT Nares normal. No drainage or sinus tenderness. Lips, mucosa, and tongue normal. No Thrush Neck: Supple, symmetrical, no adenopathy, thyroid: non tender no carotid bruit and no  JVD. Back: No CVA tenderness. Lungs: Clear to auscultation bilaterally. No Wheezing or Rhonchi. No rales. Heart: Regular rate and rhythm, no murmur, rub or gallop. Abdomen: Soft, non-tender,not distended. Bowel sounds normal. No masses Extremities: atraumatic, no cyanosis. No edema. No clubbing Skin: No rashes or lesions. Or bruising Lymph: Cervical, supraclavicular normal. Neurologic: Pupils equal reacting to light and Did not appreciate  [place]  Robertson pupil  gait slow but normal Romberg sign?? Positive(swaying to the back) Pertinent Labs Lab Results CBC    Component Value Date/Time   WBC 5.0 05/13/2019 0952   RBC 4.24 05/13/2019 0952   HGB 12.5 05/13/2019 0952   HCT 39.5 05/13/2019 0952   PLT 269 05/13/2019 0952   MCV 93.2 05/13/2019 0952   MCH 29.5 05/13/2019 0952   MCHC 31.6 05/13/2019 0952   RDW 12.8 05/13/2019 0952   LYMPHSABS 2.0 05/13/2019 0952   MONOABS 0.3 05/13/2019 0952   EOSABS 0.1 05/13/2019 0952   BASOSABS 0.0 05/13/2019 0952    CMP Latest Ref Rng & Units 05/13/2019 11/12/2018 05/08/2018  Glucose 70 - 99 mg/dL 175(H) 131(H) 237(H)  BUN 8 - 23 mg/dL 10 8 9   Creatinine 0.44 - 1.00 mg/dL 0.90 0.90 0.99  Sodium 135 - 145 mmol/L 142 140 140  Potassium 3.5 - 5.1 mmol/L 3.6 3.4(L) 3.6  Chloride 98 - 111 mmol/L 106 105 105  CO2 22 - 32 mmol/L 26 24 26   Calcium 8.9 - 10.3 mg/dL 10.0 9.8 9.5  Total Protein 6.5 - 8.1 g/dL 7.8 7.8 7.7  Total Bilirubin 0.3 - 1.2 mg/dL 0.3 0.4 0.4  Alkaline Phos 38 - 126 U/L 83 91 85  AST 15 - 41 U/L 17 21 25   ALT 0 - 44 U/L 11 14 21     IMAGING RESULTS: I have personally reviewed the films ? Impression/Recommendation ? ?74 year old female with early memory loss of  past  2 years.  Seen by neurology Diagnosed with early neurocognitive impairment As part of the work-up underwent syphilis test.  reverse syphilis screening done and the Treponema pallidum antibody was positive and RPR was negative.  On further questioning the  patient  said 26 years ago she was treated for syphilis with 1 dose of penicillin 2,400,000 units.  She does not remember any other history.  She was at [facility] at that time She also has a low B12 at 198 for which she is getting injections..   I spoke to Chipper Herb neuro provider and recommended complete neurological examination i to detect any neurological signs suggestive of neurosyphilis or tabes dorsalis.  If there is no improvement in her memory with B12 replacement then she may need  lumbar puncture and CSF examination . Today I will check HIV, hepatitis panel. Follow-up as needed  History of breast cancers bilateral Currently on anastrozole  Diabetes mellitus on Metformin  COPD on inhalers. ___________________________________________________ Discussed with patient, and her sister and also with neurologist.  Note:  This document was prepared using Dragon voice recognition software and may include unintentional dictation errors.

## 2020-06-14 LAB — HIV ANTIBODY (ROUTINE TESTING W REFLEX): HIV Screen 4th Generation wRfx: NONREACTIVE

## 2020-06-15 LAB — HEPATITIS PANEL, ACUTE
HCV Ab: <0.1 s/co ratio — AB (ref 0.0–0.9)
Hep A IgM: NEGATIVE — AB
Hep B C IgM: NEGATIVE — AB
Hepatitis B Surface Ag: NEGATIVE — AB

## 2020-07-19 ENCOUNTER — Other Ambulatory Visit: Payer: Self-pay | Admitting: Neurology

## 2020-07-19 DIAGNOSIS — A523 Neurosyphilis, unspecified: Secondary | ICD-10-CM

## 2020-07-26 ENCOUNTER — Other Ambulatory Visit: Payer: Self-pay

## 2020-07-26 ENCOUNTER — Ambulatory Visit
Admission: RE | Admit: 2020-07-26 | Discharge: 2020-07-26 | Disposition: A | Discharge status: Home / Self Care | Payer: Medicare HMO | Source: Ambulatory Visit | Attending: Neurology | Admitting: Neurology

## 2020-07-26 DIAGNOSIS — A523 Neurosyphilis, unspecified: Secondary | ICD-10-CM | POA: Insufficient documentation

## 2020-07-26 IMAGING — MR MR HEAD W/O CM
12 series · 45 of 48 positions shown · non-contrast
Comparison: None.

CLINICAL DATA: Neurosyphilis.

EXAM:
MRI HEAD WITHOUT CONTRAST
TECHNIQUE: Multiplanar, multiecho pulse sequences of the brain and surrounding
structures were obtained without intravenous contrast.

[Series 5: ax dwi_tracew · axial · 3.0mm · 0.60mm/px · z∈[-120,+35]mm · 4 of 48 slices shown]
[im 1/48]
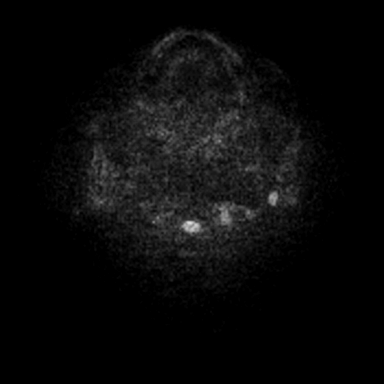
[im 16/48]
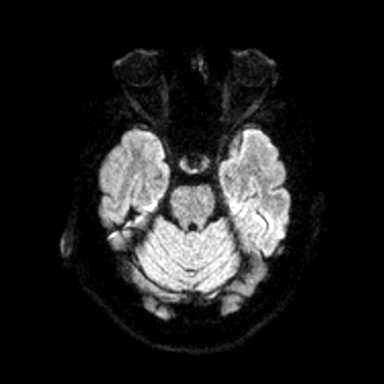
[im 32/48]
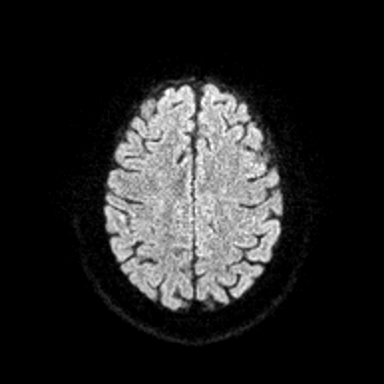
[im 48/48]
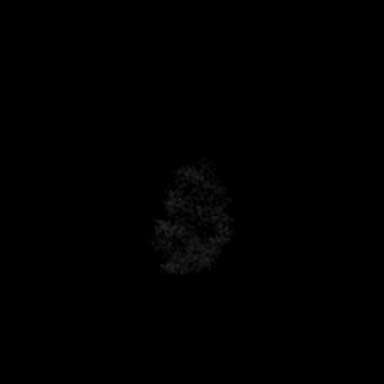

[Series 6: ax dwi_adc · axial · 3.0mm · 0.60mm/px · z∈[-120,+35]mm · 4 of 48 slices shown]
[im 1/48]
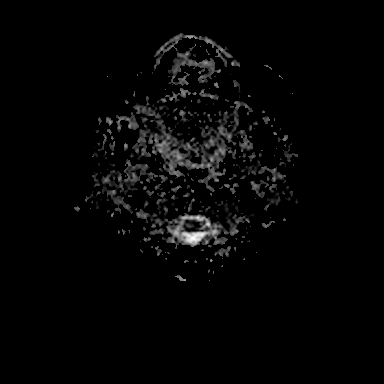
[im 16/48]
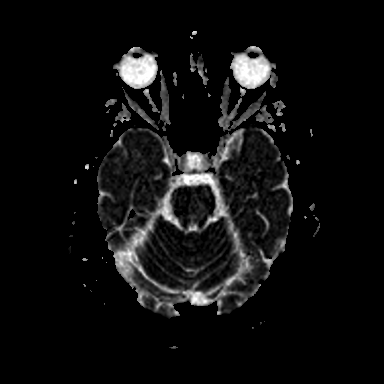
[im 32/48]
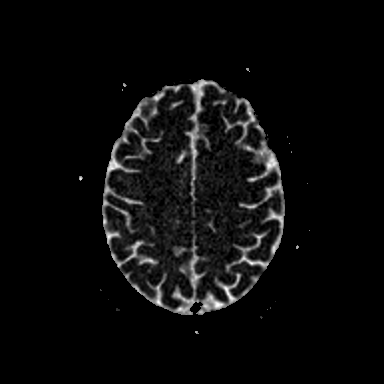
[im 48/48]
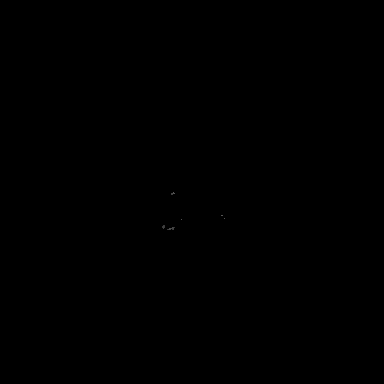

[Series 7: cor dwi_tracew · coronal · 5.0mm · 0.60mm/px · 3 of 38 slices shown]
[im 1/38]
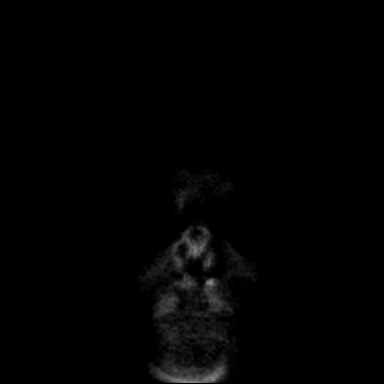
[im 19/38]
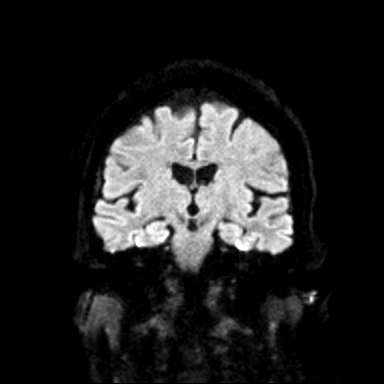
[im 38/38]
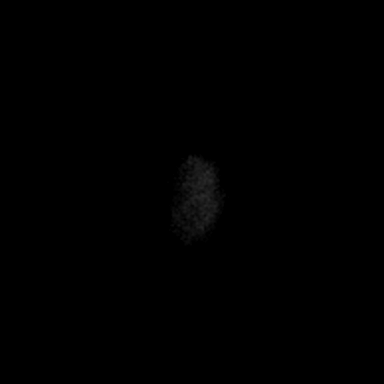

[Series 8: cor dwi_adc · coronal · 5.0mm · 0.60mm/px · 3 of 35 slices shown]
[im 1/35]
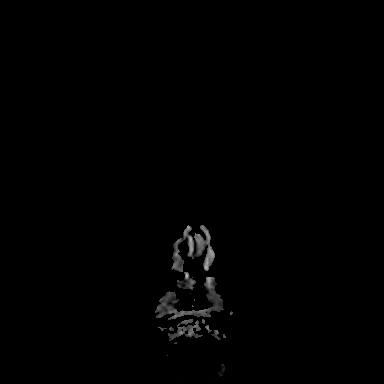
[im 18/35]
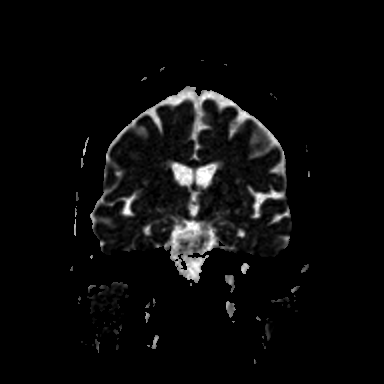
[im 35/35]
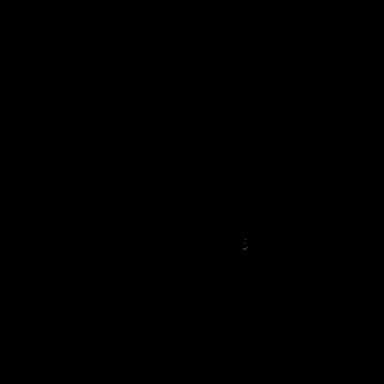

[Series 9: T1 · sagittal · 5.0mm · 0.62mm/px · 2 of 23 slices shown (1 of 2)]
[im 1/23]
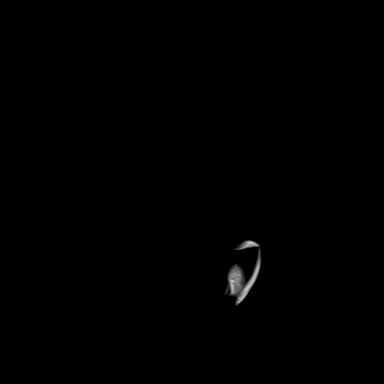
[im 23/23]
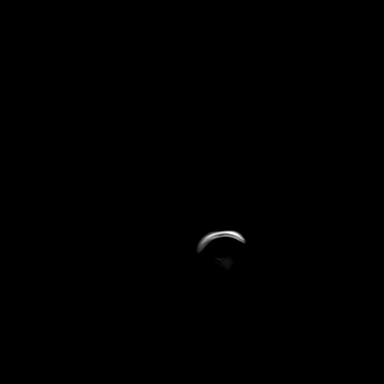

[Series 10: T2 · axial · 5.0mm · 0.53mm/px · z∈[-114,+30]mm · 2 of 25 slices shown (1 of 2)]
[im 1/25]
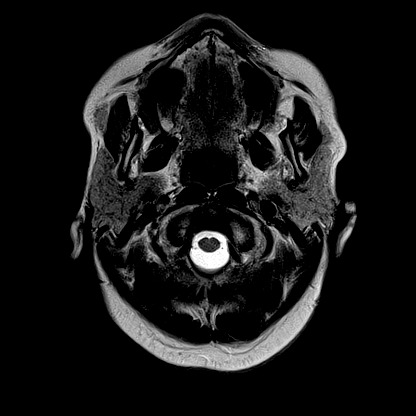
[im 25/25]
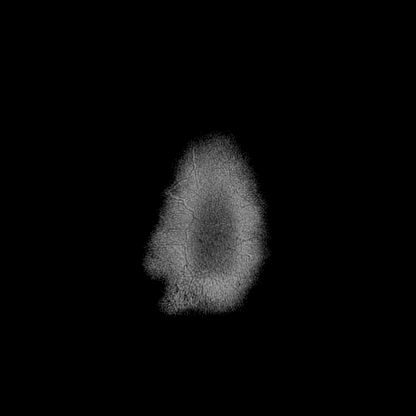

[Series 11: mag_images · axial · 3.0mm · 0.90mm/px · z∈[-130,+47]mm · 4 of 60 slices shown]
[im 1/60]
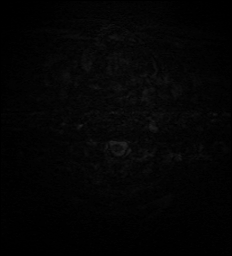
[im 20/60]
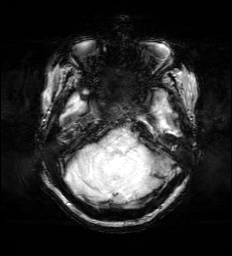
[im 40/60]
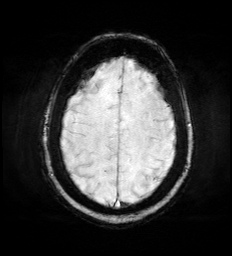
[im 60/60]
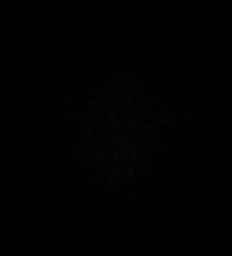

[Series 12: pha_images · axial · 3.0mm · 0.90mm/px · z∈[-127,+47]mm · 4 of 59 slices shown]
[im 1/59]
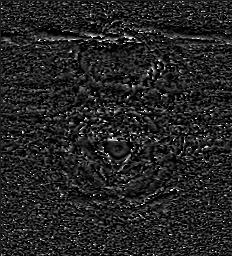
[im 20/59]
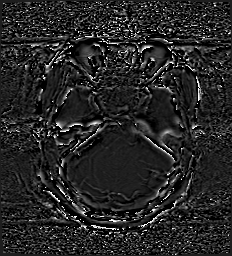
[im 39/59]
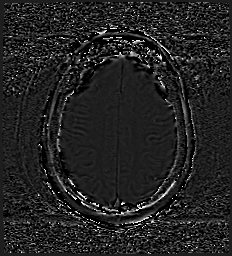
[im 59/59]
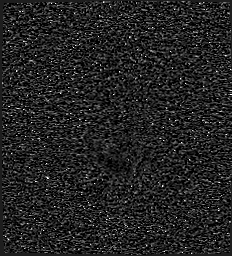

[Series 13: swi_images · axial · 3.0mm · 0.90mm/px · z∈[-130,+47]mm · 4 of 60 slices shown]
[im 1/60]
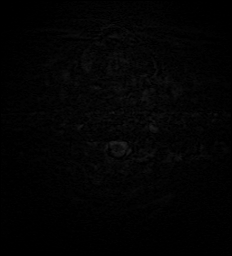
[im 20/60]
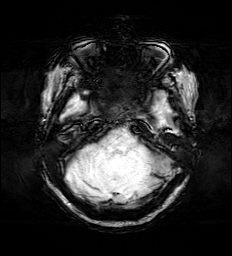
[im 40/60]
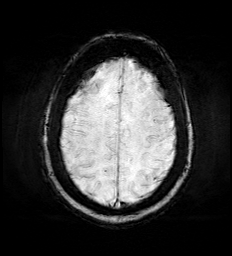
[im 60/60]
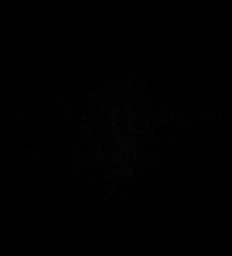

[Series 15: FLAIR · axial · 3.0mm · 0.53mm/px · z∈[-123,+39]mm · 4 of 55 slices shown]
[im 1/55]
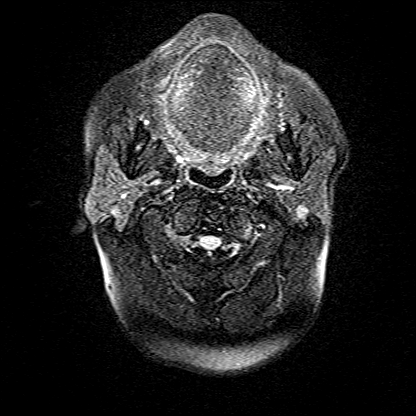
[im 19/55]
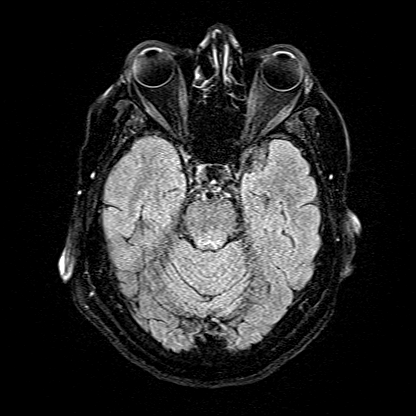
[im 37/55]
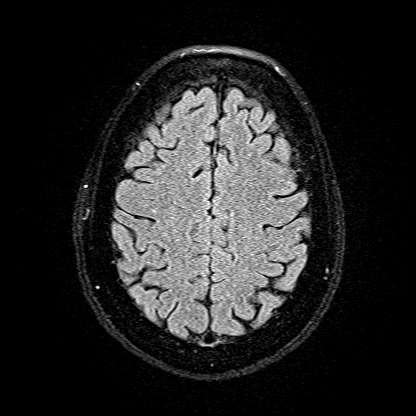
[im 55/55]
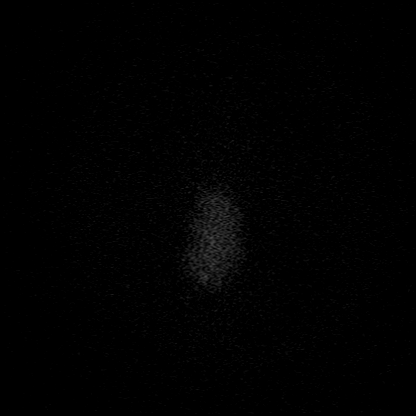

[Series 16: T1 · axial · 1.0mm · 0.98mm/px · z∈[-127,+31]mm · 9 of 160 slices shown (2 of 2)]
[im 1/160]
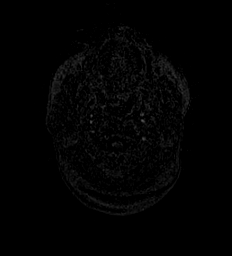
[im 15/160]
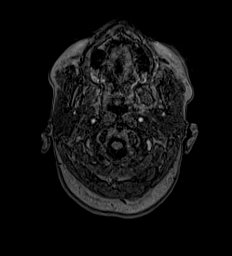
[im 29/160]
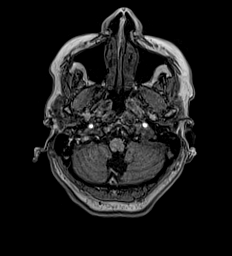
[im 44/160]
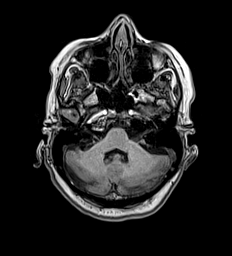
[im 73/160]
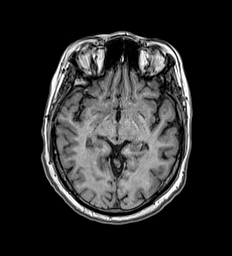
[im 87/160]
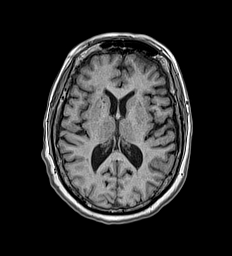
[im 116/160]
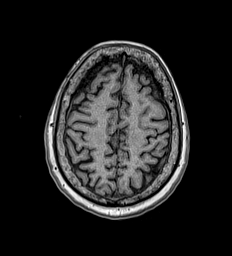
[im 131/160]
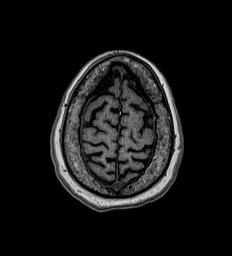
[im 160/160]
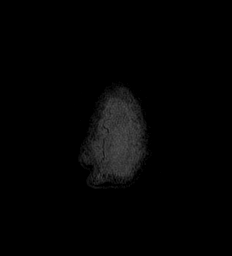

[Series 17: T2 · coronal · 5.0mm · 0.57mm/px · 2 of 29 slices shown (2 of 2)]
[im 1/29]
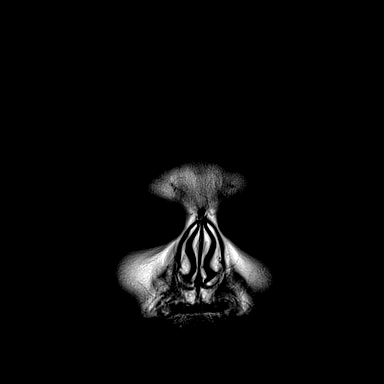
[im 29/29]
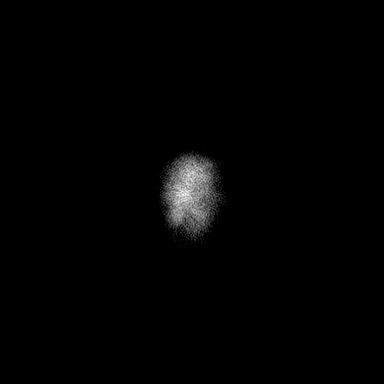

[45 of 48 positions shown; findings below may reference images not displayed]

FINDINGS: Brain: No diffusion-weighted signal abnormality. No intracranial
hemorrhage. No midline shift, ventriculomegaly or extra-axial fluid
collection. No mass lesion. Mild chronic microvascular ischemic
changes.

Vascular: Normal flow voids.

Skull and upper cervical spine: Normal marrow signal.

Sinuses/Orbits: Normal orbits. Clear paranasal sinuses. Trace
bilateral mastoid free fluid.

Other: None.
IMPRESSION: Mild chronic microvascular ischemic changes.

Otherwise unremarkable noncontrast MRI brain.

## 2020-10-09 ENCOUNTER — Other Ambulatory Visit: Payer: Self-pay

## 2020-10-09 ENCOUNTER — Observation Stay: Payer: 59

## 2020-10-09 ENCOUNTER — Observation Stay: Admit: 2020-10-09 | Payer: 59

## 2020-10-09 ENCOUNTER — Observation Stay
Admission: EM | Admit: 2020-10-09 | Discharge: 2020-10-10 | Disposition: A | Discharge status: Home / Self Care | Payer: 59 | Attending: Internal Medicine | Admitting: Internal Medicine

## 2020-10-09 ENCOUNTER — Emergency Department: Payer: 59

## 2020-10-09 DIAGNOSIS — E782 Mixed hyperlipidemia: Secondary | ICD-10-CM | POA: Diagnosis not present

## 2020-10-09 DIAGNOSIS — M7989 Other specified soft tissue disorders: Secondary | ICD-10-CM

## 2020-10-09 DIAGNOSIS — Z79899 Other long term (current) drug therapy: Secondary | ICD-10-CM | POA: Diagnosis not present

## 2020-10-09 DIAGNOSIS — Z7982 Long term (current) use of aspirin: Secondary | ICD-10-CM | POA: Diagnosis not present

## 2020-10-09 DIAGNOSIS — Z20822 Contact with and (suspected) exposure to covid-19: Secondary | ICD-10-CM | POA: Insufficient documentation

## 2020-10-09 DIAGNOSIS — N189 Chronic kidney disease, unspecified: Secondary | ICD-10-CM | POA: Diagnosis not present

## 2020-10-09 DIAGNOSIS — Z7984 Long term (current) use of oral hypoglycemic drugs: Secondary | ICD-10-CM | POA: Diagnosis not present

## 2020-10-09 DIAGNOSIS — I69952 Hemiplegia and hemiparesis following unspecified cerebrovascular disease affecting left dominant side: Secondary | ICD-10-CM | POA: Insufficient documentation

## 2020-10-09 DIAGNOSIS — Z8661 Personal history of infections of the central nervous system: Secondary | ICD-10-CM

## 2020-10-09 DIAGNOSIS — F0390 Unspecified dementia without behavioral disturbance: Secondary | ICD-10-CM

## 2020-10-09 DIAGNOSIS — R2990 Unspecified symptoms and signs involving the nervous system: Secondary | ICD-10-CM | POA: Diagnosis not present

## 2020-10-09 DIAGNOSIS — E1122 Type 2 diabetes mellitus with diabetic chronic kidney disease: Secondary | ICD-10-CM | POA: Diagnosis not present

## 2020-10-09 DIAGNOSIS — J449 Chronic obstructive pulmonary disease, unspecified: Secondary | ICD-10-CM | POA: Diagnosis not present

## 2020-10-09 DIAGNOSIS — F17210 Nicotine dependence, cigarettes, uncomplicated: Secondary | ICD-10-CM | POA: Insufficient documentation

## 2020-10-09 DIAGNOSIS — I639 Cerebral infarction, unspecified: Secondary | ICD-10-CM

## 2020-10-09 DIAGNOSIS — I129 Hypertensive chronic kidney disease with stage 1 through stage 4 chronic kidney disease, or unspecified chronic kidney disease: Secondary | ICD-10-CM | POA: Insufficient documentation

## 2020-10-09 DIAGNOSIS — I69992 Facial weakness following unspecified cerebrovascular disease: Principal | ICD-10-CM | POA: Insufficient documentation

## 2020-10-09 DIAGNOSIS — E119 Type 2 diabetes mellitus without complications: Secondary | ICD-10-CM

## 2020-10-09 DIAGNOSIS — M4800 Spinal stenosis, site unspecified: Secondary | ICD-10-CM | POA: Diagnosis not present

## 2020-10-09 DIAGNOSIS — R29810 Facial weakness: Secondary | ICD-10-CM | POA: Diagnosis present

## 2020-10-09 LAB — CBC
HCT: 35.1 % — ABNORMAL LOW (ref 36.0–46.0)
Hemoglobin: 11.6 g/dL — ABNORMAL LOW (ref 12.0–15.0)
MCH: 29.6 pg (ref 26.0–34.0)
MCHC: 33 g/dL (ref 30.0–36.0)
MCV: 89.5 fL (ref 80.0–100.0)
Platelets: 226 10*3/uL (ref 150–400)
RBC: 3.92 MIL/uL (ref 3.87–5.11)
RDW: 12.7 % (ref 11.5–15.5)
WBC: 5.7 10*3/uL (ref 4.0–10.5)
nRBC: 0 % (ref 0.0–0.2)

## 2020-10-09 LAB — DIFFERENTIAL
Abs Immature Granulocytes: 0.02 10*3/uL (ref 0.00–0.07)
Basophils Absolute: 0 10*3/uL (ref 0.0–0.1)
Basophils Relative: 0 %
Eosinophils Absolute: 0.1 10*3/uL (ref 0.0–0.5)
Eosinophils Relative: 2 %
Immature Granulocytes: 0 %
Lymphocytes Relative: 28 %
Lymphs Abs: 1.6 10*3/uL (ref 0.7–4.0)
Monocytes Absolute: 0.5 10*3/uL (ref 0.1–1.0)
Monocytes Relative: 8 %
Neutro Abs: 3.5 10*3/uL (ref 1.7–7.7)
Neutrophils Relative %: 62 %

## 2020-10-09 LAB — COMPREHENSIVE METABOLIC PANEL
ALT: 13 U/L (ref 0–44)
AST: 20 U/L (ref 15–41)
Albumin: 4.1 g/dL (ref 3.5–5.0)
Alkaline Phosphatase: 66 U/L (ref 38–126)
Anion gap: 8 (ref 5–15)
BUN: 14 mg/dL (ref 8–23)
CO2: 25 mmol/L (ref 22–32)
Calcium: 9.8 mg/dL (ref 8.9–10.3)
Chloride: 105 mmol/L (ref 98–111)
Creatinine, Ser: 0.89 mg/dL (ref 0.44–1.00)
GFR, Estimated: >60 mL/min (ref >60)
Glucose, Bld: 99 mg/dL (ref 70–99)
Potassium: 4.4 mmol/L (ref 3.5–5.1)
Sodium: 138 mmol/L (ref 135–145)
Total Bilirubin: 0.6 mg/dL (ref 0.3–1.2)
Total Protein: 7.6 g/dL (ref 6.5–8.1)

## 2020-10-09 LAB — TSH: TSH: 0.856 u[IU]/mL (ref 0.350–4.500)

## 2020-10-09 LAB — SARS CORONAVIRUS 2 (TAT 6-24 HRS): SARS Coronavirus 2: NEGATIVE

## 2020-10-09 LAB — CBG MONITORING, ED: Glucose-Capillary: 93 mg/dL (ref 70–99)

## 2020-10-09 LAB — URINE DRUG SCREEN, QUALITATIVE (ARMC ONLY)
Amphetamines, Ur Screen: NOT DETECTED
Barbiturates, Ur Screen: NOT DETECTED
Benzodiazepine, Ur Scrn: NOT DETECTED
Cannabinoid 50 Ng, Ur ~~LOC~~: NOT DETECTED
Cocaine Metabolite,Ur ~~LOC~~: NOT DETECTED
MDMA (Ecstasy)Ur Screen: NOT DETECTED
Methadone Scn, Ur: NOT DETECTED
Opiate, Ur Screen: NOT DETECTED
Phencyclidine (PCP) Ur S: NOT DETECTED
Tricyclic, Ur Screen: NOT DETECTED

## 2020-10-09 LAB — PROTIME-INR
INR: 1.1 (ref 0.8–1.2)
Prothrombin Time: 13.4 seconds (ref 11.4–15.2)

## 2020-10-09 LAB — APTT: aPTT: 33 seconds (ref 24–36)

## 2020-10-09 IMAGING — MR MR CERVICAL SPINE WO/W CM
6 of 10 series · 25 of 48 positions shown · IV contrast (6ml Gadavist)
Comparison: None available.

CLINICAL DATA: Initial evaluation for stroke-like symptoms, facial
droop, slurred speech, left upper extremity weakness. History of
neuro syphilis.

EXAM:
MRI CERVICAL SPINE WITHOUT AND WITH CONTRAST
TECHNIQUE: Multiplanar and multiecho pulse sequences of the cervical spine, to
include the craniocervical junction and cervicothoracic junction,
were obtained without and with intravenous contrast.
CONTRAST:  6mL GADAVIST GADOBUTROL 1 MMOL/ML IV SOLN

[Series 5: T2 · sagittal · 3.0mm · 0.62mm/px · 3 of 15 slices shown (1 of 2)]
[im 1/15]
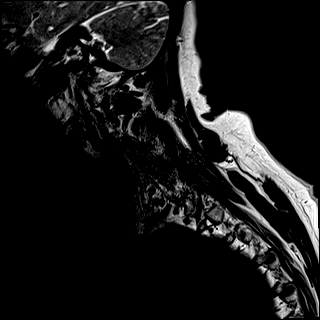
[im 8/15]
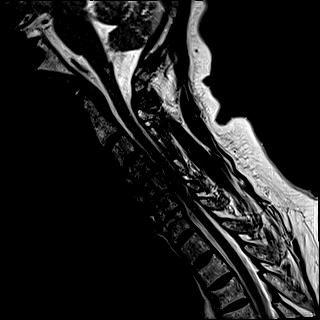
[im 15/15]
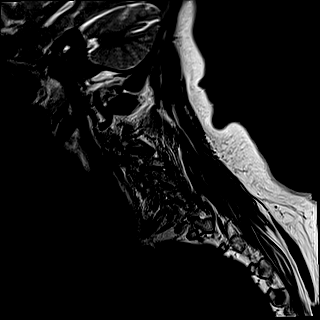

[Series 7: STIR · sagittal · 3.0mm · 0.62mm/px · 3 of 15 slices shown]
[im 1/15]
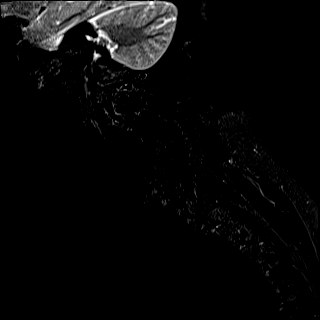
[im 8/15]
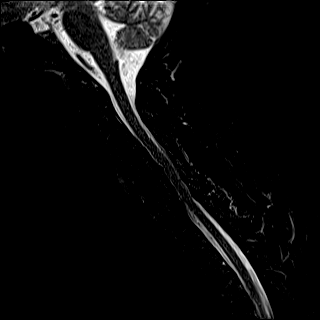
[im 15/15]
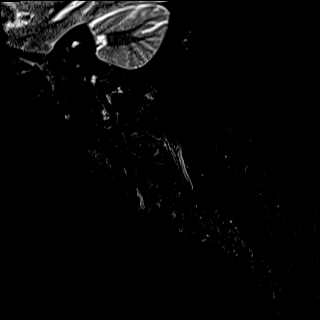

[Series 8: T2 · oblique · 3.0mm · 0.70mm/px · 6 of 29 slices shown (2 of 2)]
[im 1/29]
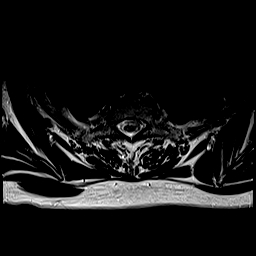
[im 6/29]
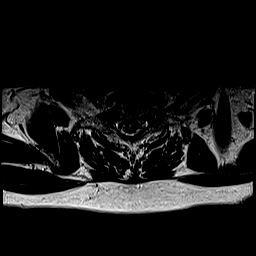
[im 12/29]
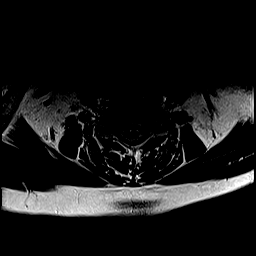
[im 17/29]
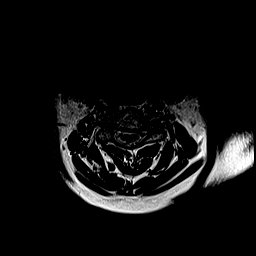
[im 23/29]
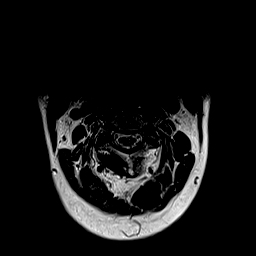
[im 29/29]
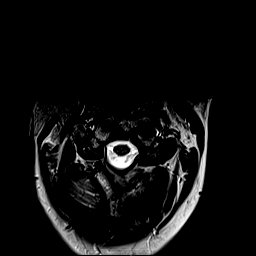

[Series 10: T1 · oblique · non-contrast · 3.0mm · 0.35mm/px · 6 of 29 slices shown (1 of 2)]
[im 1/29]
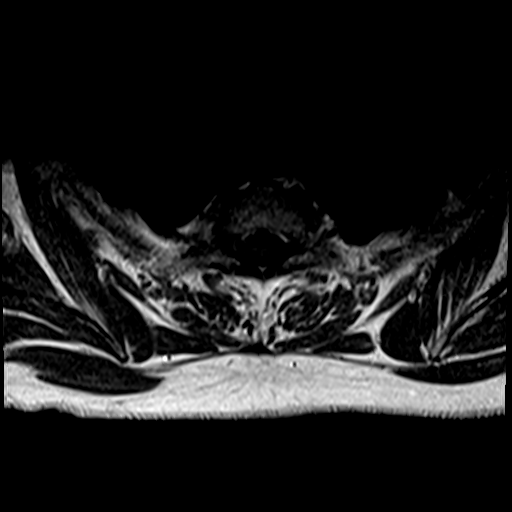
[im 6/29]
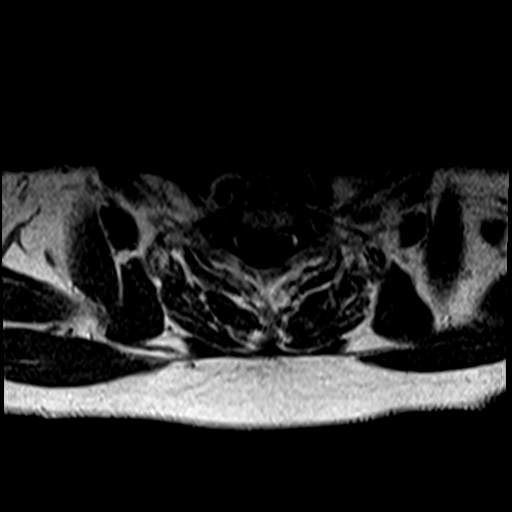
[im 12/29]
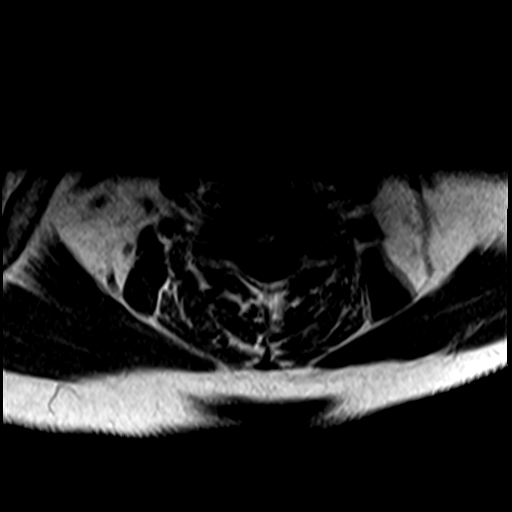
[im 17/29]
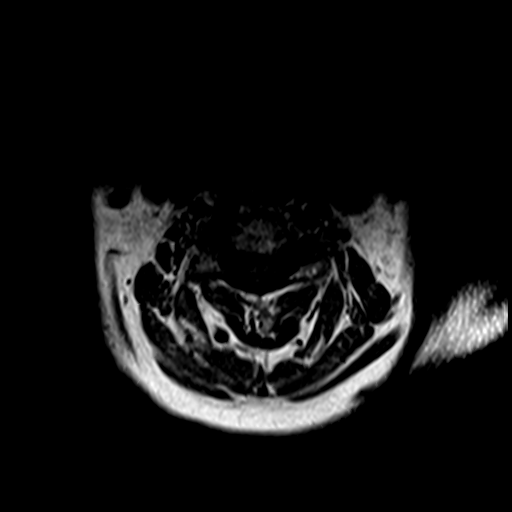
[im 23/29]
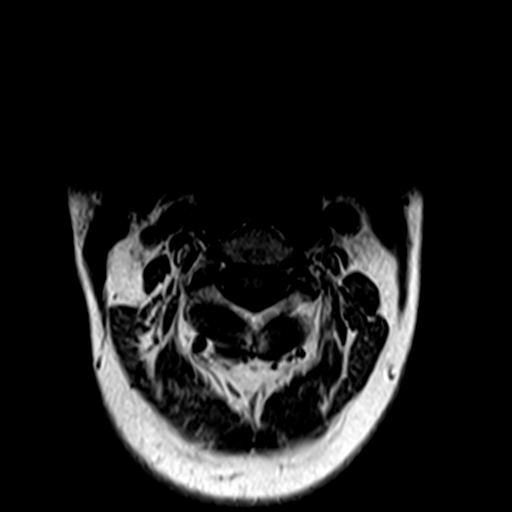
[im 29/29]
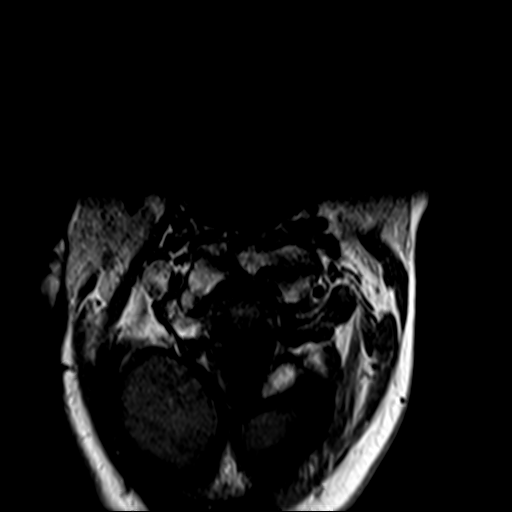

[Series 11: T1 · oblique · non-contrast · 3.0mm · 0.35mm/px · 6 of 29 slices shown (2 of 2)]
[im 1/29]
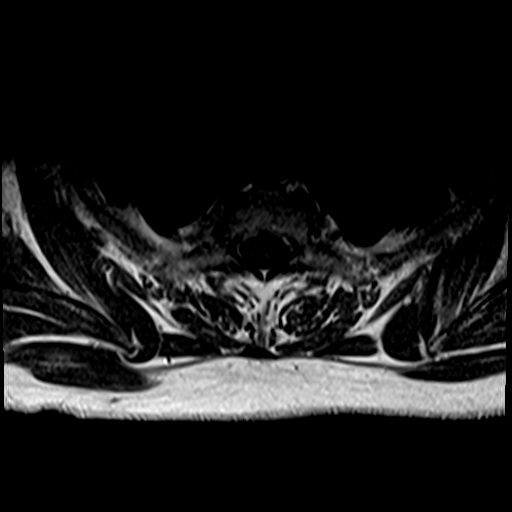
[im 6/29]
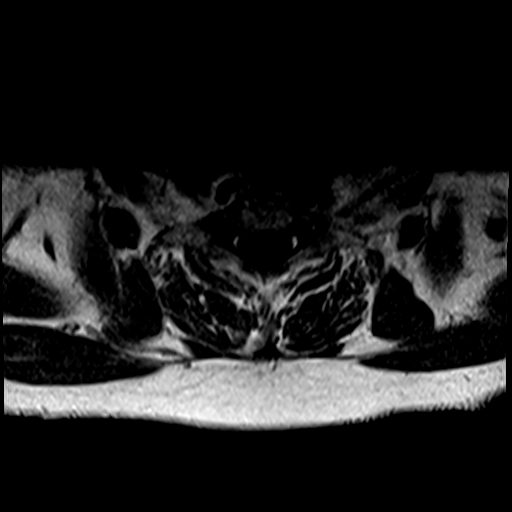
[im 12/29]
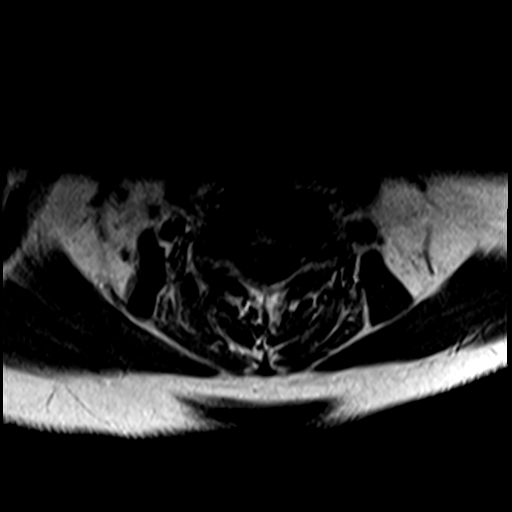
[im 17/29]
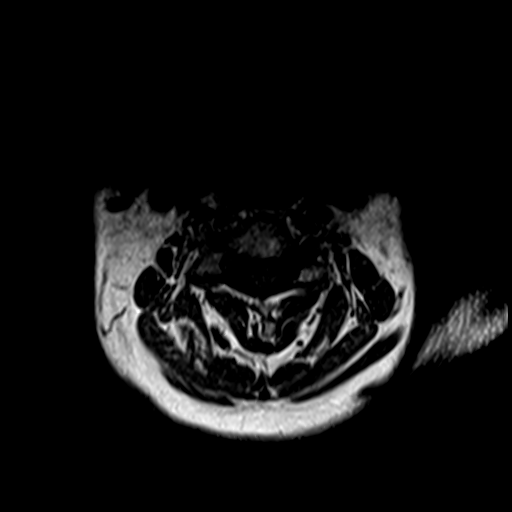
[im 23/29]
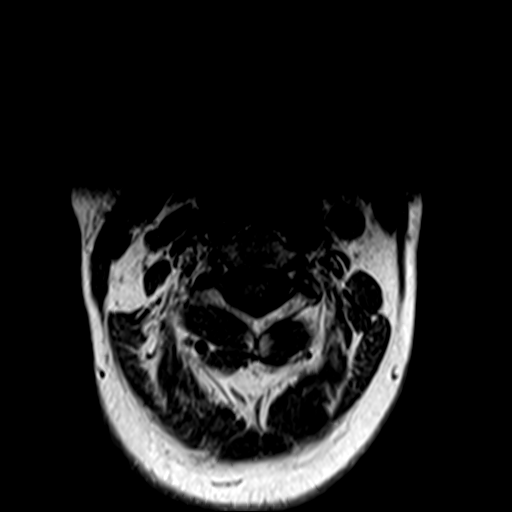
[im 29/29]
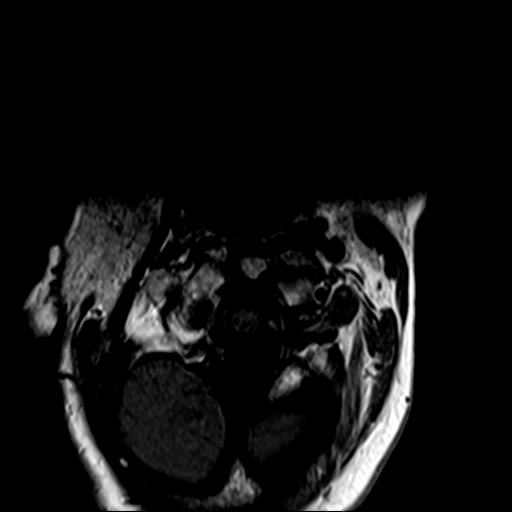

[Series 13: T1 post-contrast · oblique · 3.0mm · 0.35mm/px · 1 of 29 slices shown]
[im 1/29]
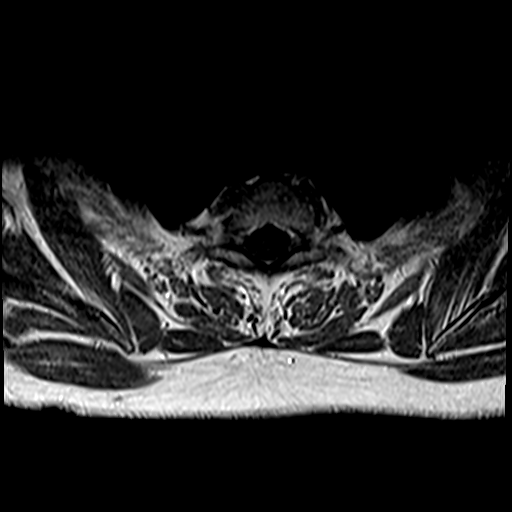

[25 of 48 positions shown; findings below may reference images not displayed]

FINDINGS: Alignment: Straightening with mild reversal of the normal cervical
lordosis. No listhesis.

Vertebrae: Minimal chronic height loss noted at the superior
endplates of T2 and T3. Vertebral body height otherwise maintained
without evidence for acute or subacute fracture. Bone marrow signal
intensity within normal limits. No discrete or worrisome osseous
lesions. No abnormal marrow edema or enhancement.

Cord: Normal signal and morphology.  No abnormal enhancement.

Posterior Fossa, vertebral arteries, paraspinal tissues: Visualized
brain and posterior fossa within normal limits. Craniocervical
junction normal. Paraspinous and prevertebral soft tissues within
normal limits. Normal flow voids seen within the vertebral arteries
bilaterally.

Disc levels:

C2-C3: Small central disc protrusion minimally indents the ventral
thecal sac. Superimposed mild uncovertebral and facet hypertrophy.
No spinal stenosis. Foramina remain patent.

C3-C4: Mild disc bulge with uncovertebral hypertrophy. Superimposed
left-sided facet degeneration. No significant spinal stenosis.
Moderate left C4 foraminal narrowing. Right neural foramen remains
patent.

C4-C5: Degenerative intervertebral disc space narrowing with diffuse
disc osteophyte complex, eccentric to the right. Broad posterior
component flattens and partially effaces the ventral thecal sac,
eccentric to the right. Mild flattening of the ventral cord without
cord signal changes. Mild spinal stenosis. There is a superimposed
more focal right foraminal to extraforaminal disc osteophyte complex
with resultant severe right C5 foraminal stenosis (series 8, image
15). Moderate left C5 foraminal narrowing present as well.

C5-C6: Degenerative intervertebral disc space narrowing with diffuse
disc osteophyte complex, asymmetric to the left. Flattening and
partial effacement of the ventral thecal sac with mild cord
flattening. Superimposed mild ligament flavum hypertrophy. Resultant
mild-to-moderate spinal stenosis. Moderate to severe left worse than
right C6 foraminal narrowing.

C6-C7: Degenerative intervertebral disc space narrowing with diffuse
disc osteophyte complex. Superimposed mild facet and ligament flavum
hypertrophy. Resultant mild spinal stenosis with moderate bilateral
C7 foraminal narrowing.

C7-T1: Minimal disc bulge with facet hypertrophy. No spinal
stenosis. Foramina remain patent.

Visualized upper thoracic spine demonstrates no significant finding.
IMPRESSION: 1. No acute abnormality within the cervical spine. No evidence for
demyelinating disease, myelopathy, or acute infection.
2. Left eccentric disc osteophyte complex at C5-6 with resultant
mild to moderate spinal stenosis, with moderate to severe left worse
than right C6 foraminal narrowing. Findings could contribute to left
upper extremity symptoms.
3. Right eccentric disc osteophyte complex at C4-5 with resultant
mild canal with severe right C6 foraminal narrowing.
4. Additional moderate left C5 and bilateral C7 foraminal stenosis
related to uncovertebral and facet disease.

## 2020-10-09 IMAGING — CT CT HEAD W/O CM
3 series · 16 of 47 positions shown, 19 images · non-contrast
Comparison: [DATE] brain MRI.

CLINICAL DATA: Slurred speech, facial droop with left upper
extremity weakness and drift since yesterday. No reported injury.

EXAM:
CT HEAD WITHOUT CONTRAST
TECHNIQUE: Contiguous axial images were obtained from the base of the skull
through the vertex without intravenous contrast.

[Series 2: head wo · axial · 0.40mm/px · z∈[+221,+346]mm · 10 of 31 slices shown, 13 images]
[im 3/31  brain]
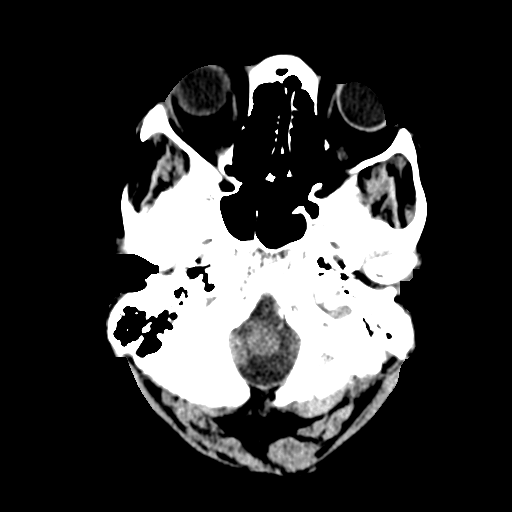
[im 3/31  bone]
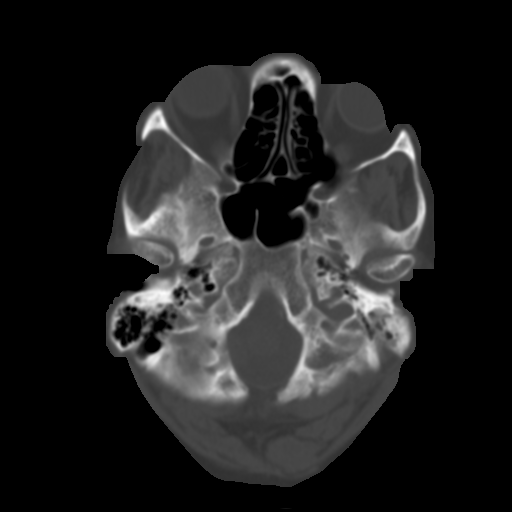
[im 6/31  brain]
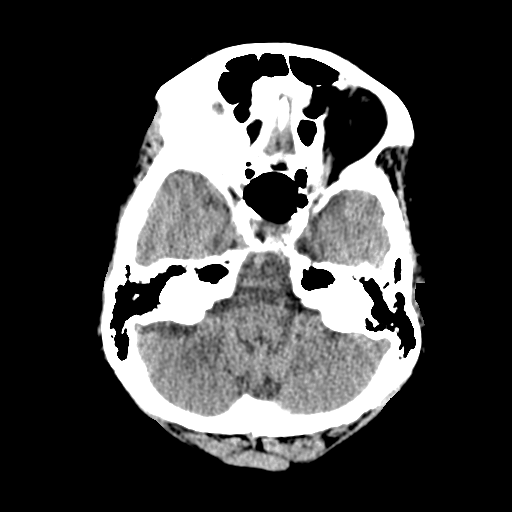
[im 9/31  brain]
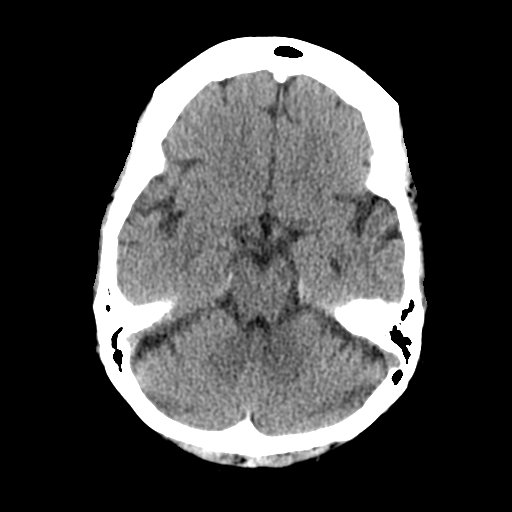
[im 11/31  brain]
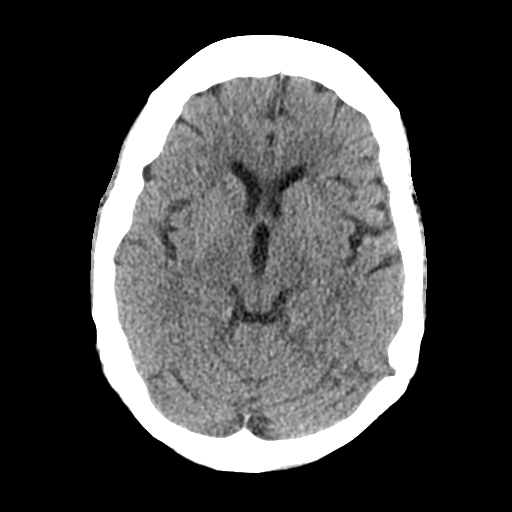
[im 14/31  brain]
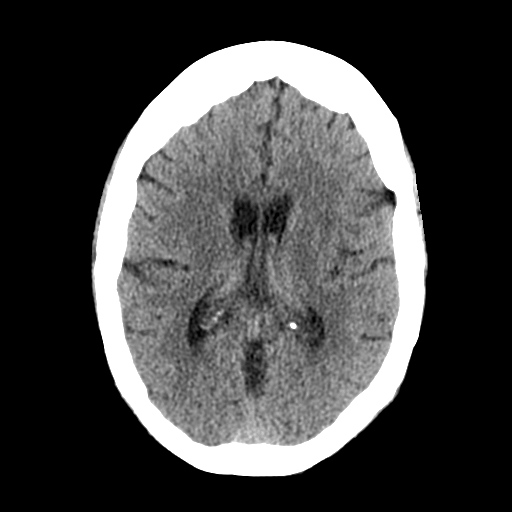
[im 14/31  bone]
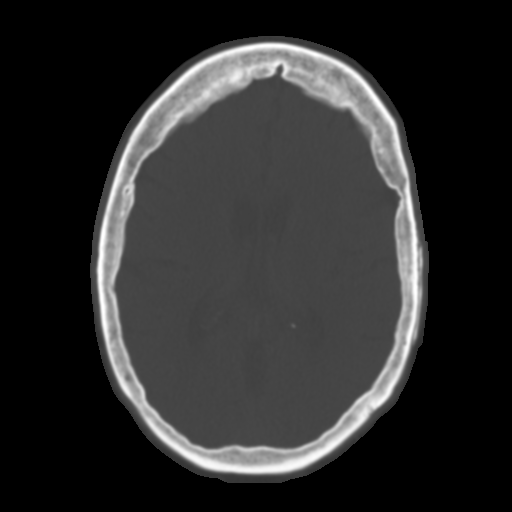
[im 17/31  brain]
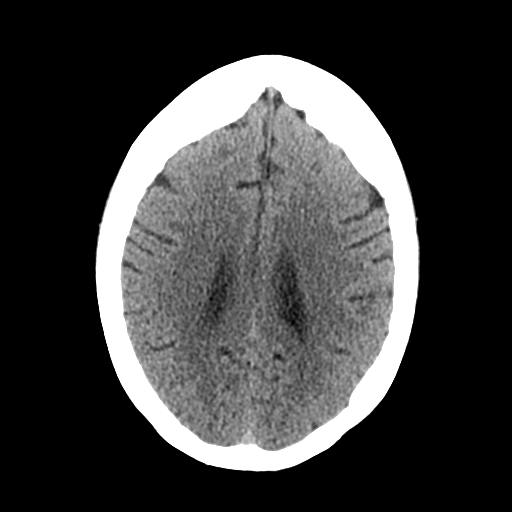
[im 20/31  brain]
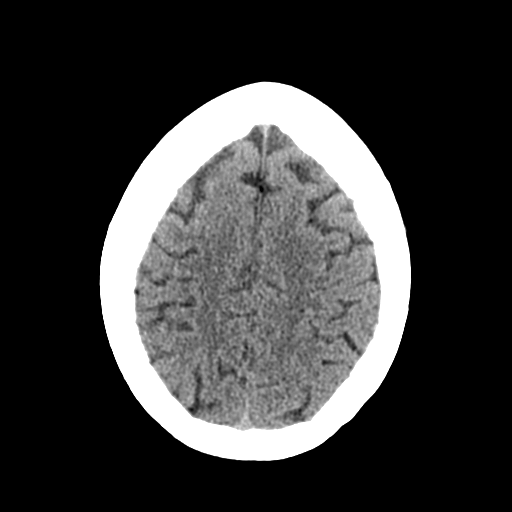
[im 23/31  brain]
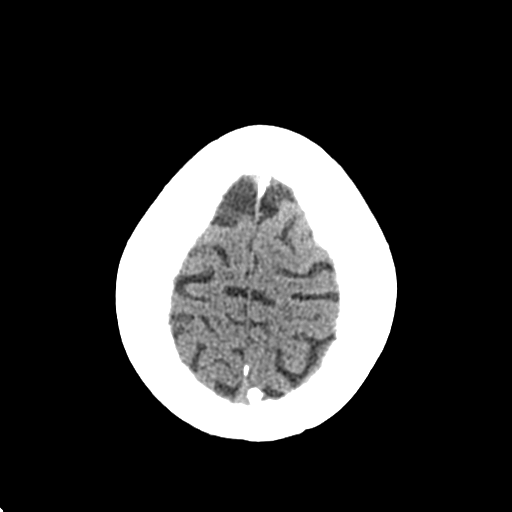
[im 25/31  brain]
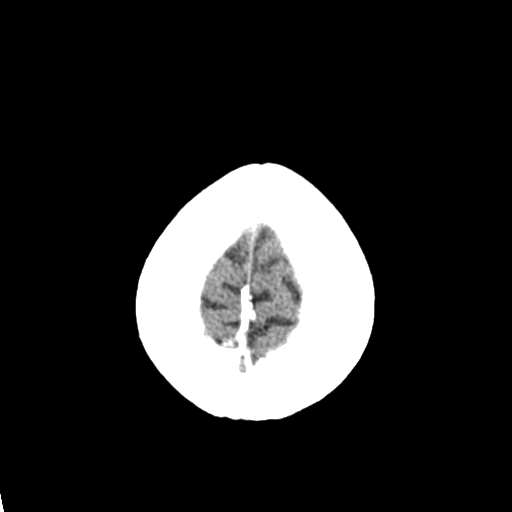
[im 25/31  bone]
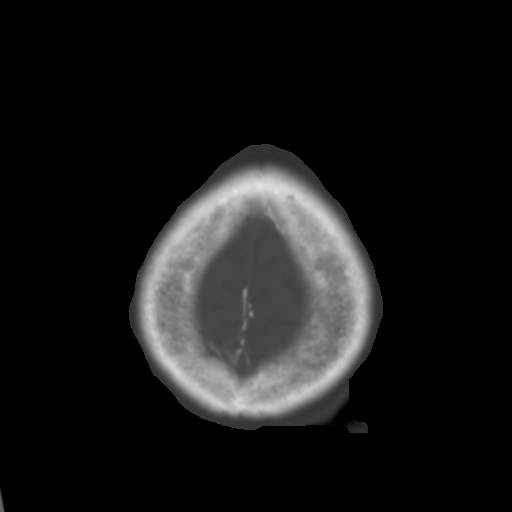
[im 28/31  brain]
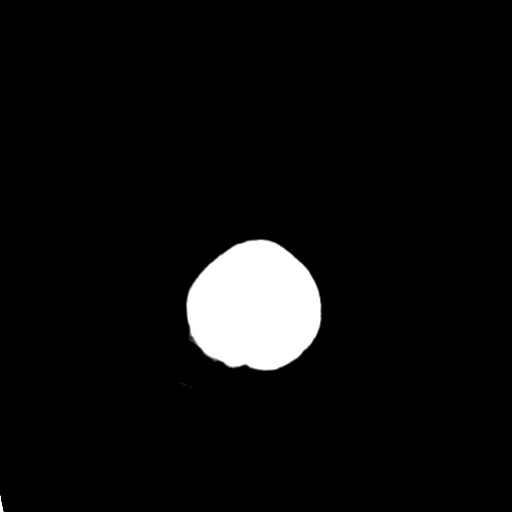

[Series 4: coronal soft tissue · coronal · 0.31mm/px · 3 of 64 slices shown]
[im 22/64  brain]
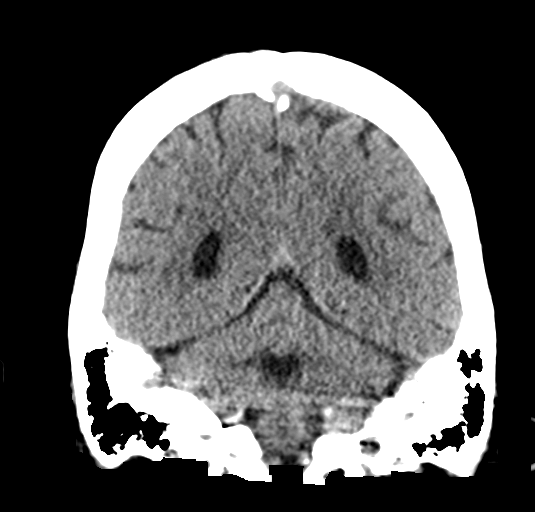
[im 29/64  brain]
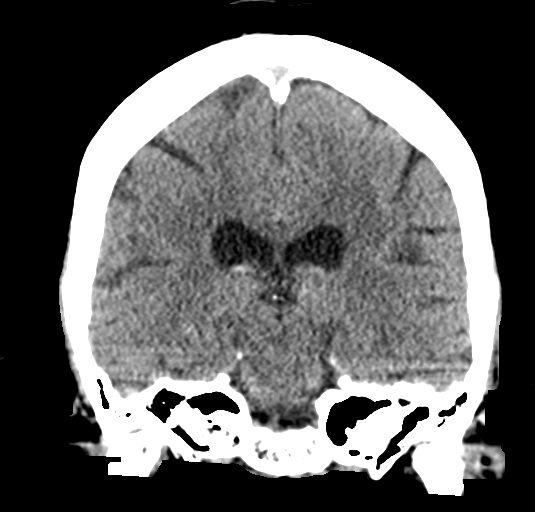
[im 36/64  brain]
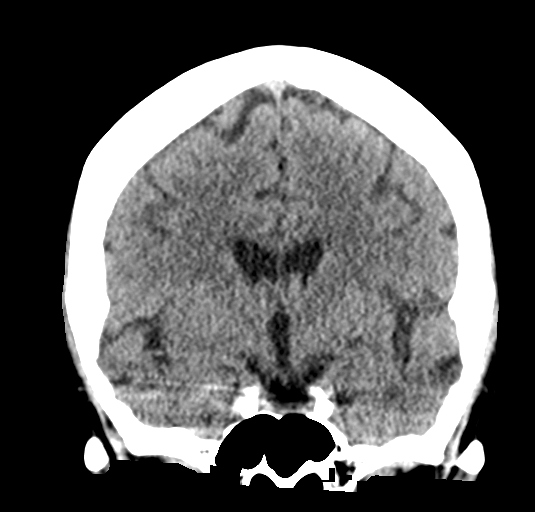

[Series 5: sagittal soft tissue · sagittal · 0.31mm/px · 3 of 51 slices shown]
[im 17/51  brain]
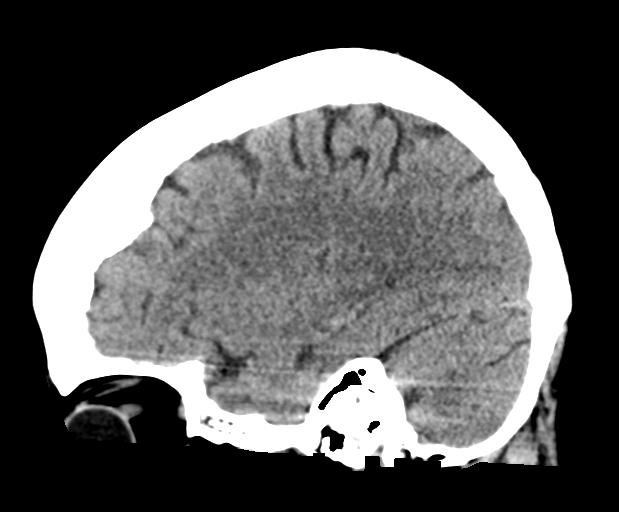
[im 26/51  brain]
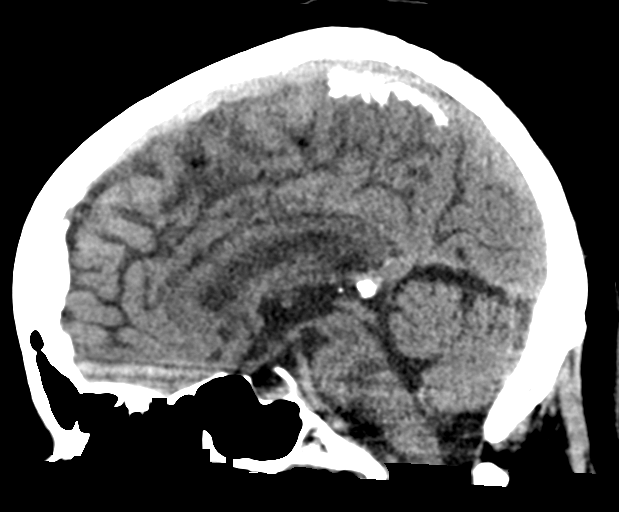
[im 34/51  brain]
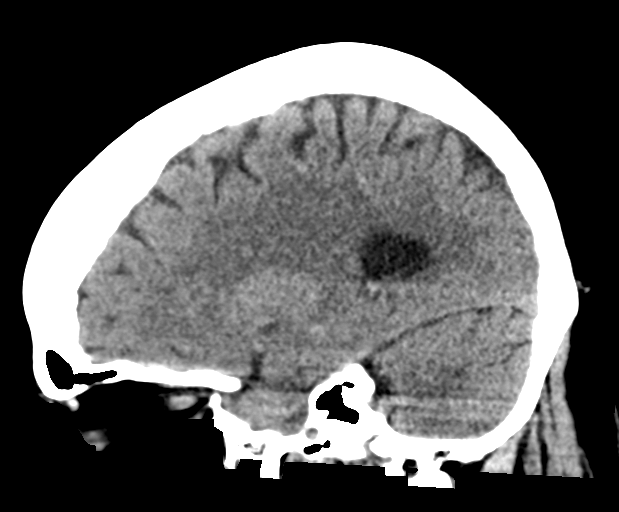

[16 of 47 positions shown; findings below may reference images not displayed]

FINDINGS: Brain: No evidence of parenchymal hemorrhage or extra-axial fluid
collection. No mass lesion, mass effect, or midline shift. No CT
evidence of acute infarction. Nonspecific mild subcortical and
periventricular white matter hypodensity, most in keeping with
chronic small vessel ischemic change. Cerebral volume is age
appropriate. No ventriculomegaly.

Vascular: No acute abnormality.

Skull: No evidence of calvarial fracture.

Sinuses/Orbits: The visualized paranasal sinuses are essentially
clear.

Other:  The mastoid air cells are unopacified.
IMPRESSION: 1. No evidence of acute intracranial abnormality.
2. Mild chronic small vessel ischemic changes in the cerebral white
matter.

## 2020-10-09 IMAGING — MR MR HEAD W/O CM
9 of 11 series · 35 of 48 positions shown · non-contrast
Comparison: Head CT same day.  MRI [DATE].

CLINICAL DATA: Facial droop and left-sided weakness.

EXAM:
MRI HEAD WITHOUT CONTRAST
TECHNIQUE: Multiplanar, multiecho pulse sequences of the brain and surrounding
structures were obtained without intravenous contrast.

[Series 5: ax dwi_tracew · axial · 3.0mm · 0.65mm/px · z∈[-167,-16]mm · 5 of 48 slices shown]
[im 1/48]
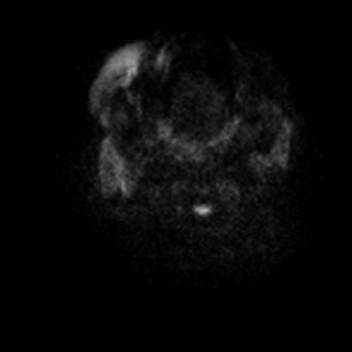
[im 12/48]
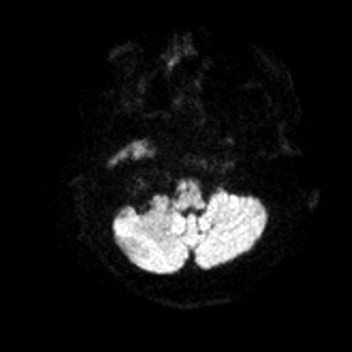
[im 24/48]
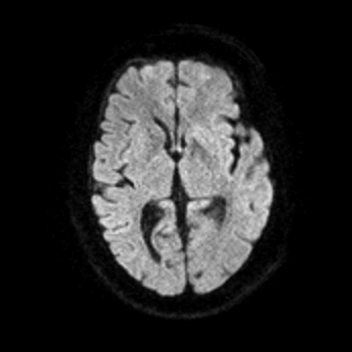
[im 36/48]
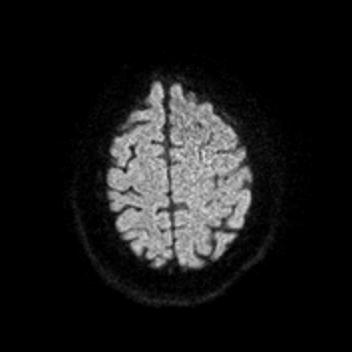
[im 48/48]
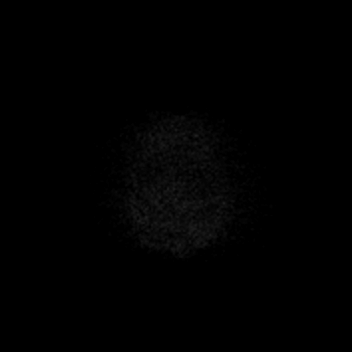

[Series 6: ax dwi_adc · axial · 3.0mm · 0.65mm/px · z∈[-167,-26]mm · 5 of 45 slices shown]
[im 1/45]
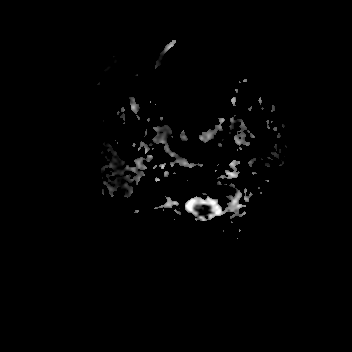
[im 12/45]
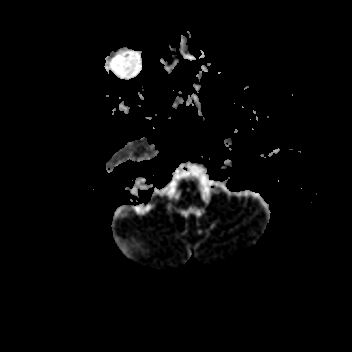
[im 23/45]
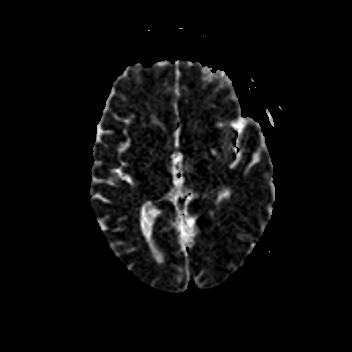
[im 34/45]
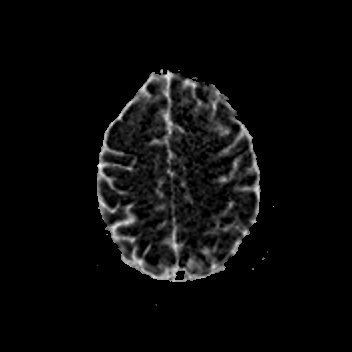
[im 45/45]
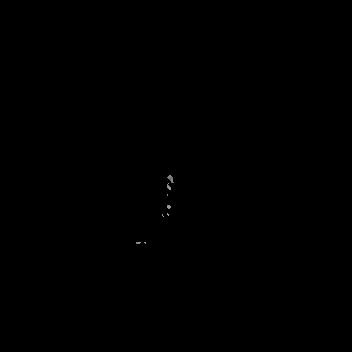

[Series 7: FLAIR · axial · 3.0mm · 0.53mm/px · z∈[-169,-12]mm · 6 of 55 slices shown]
[im 1/55]
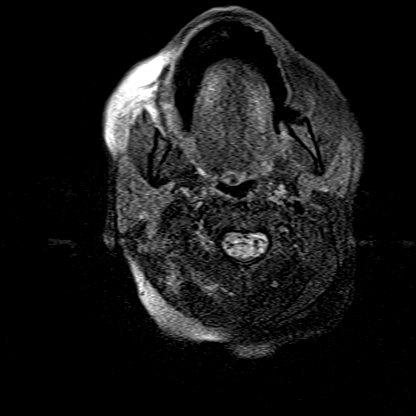
[im 11/55]
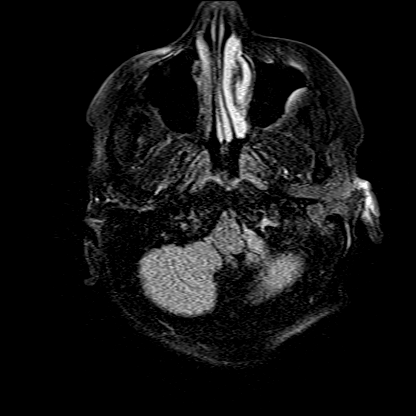
[im 22/55]
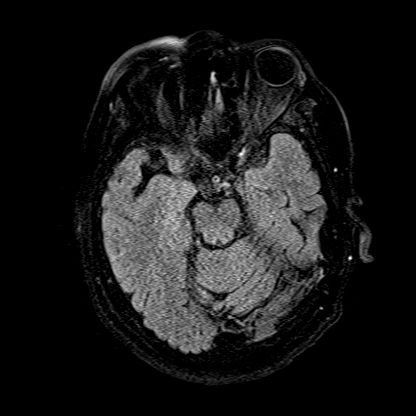
[im 33/55]
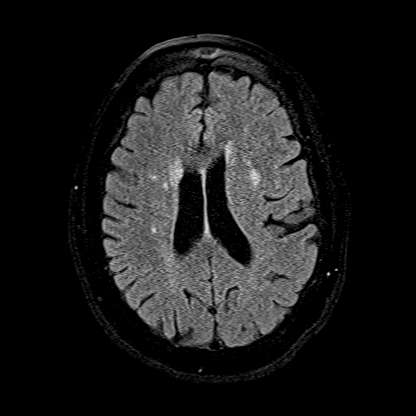
[im 44/55]
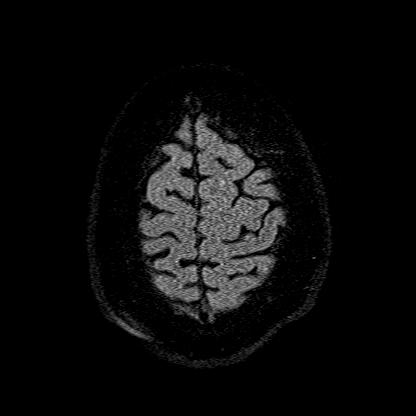
[im 55/55]
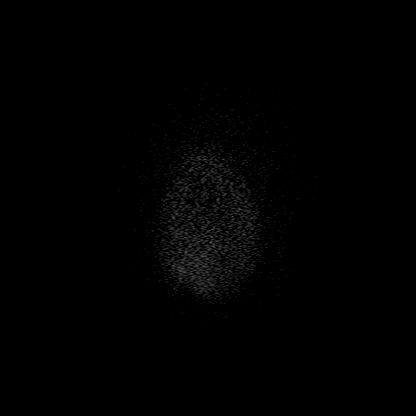

[Series 8: cor dwi_tracew · coronal · 5.0mm · 0.60mm/px · 4 of 38 slices shown]
[im 1/38]
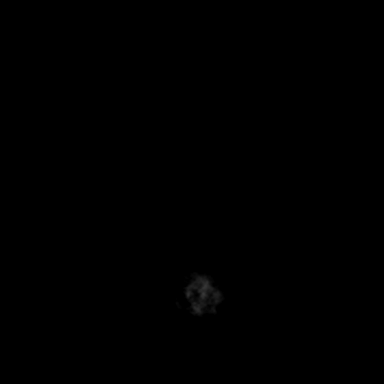
[im 13/38]
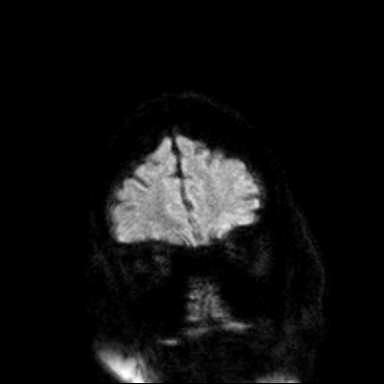
[im 25/38]
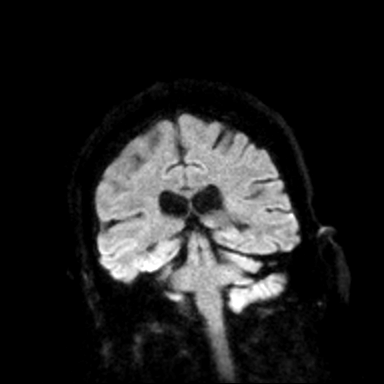
[im 38/38]
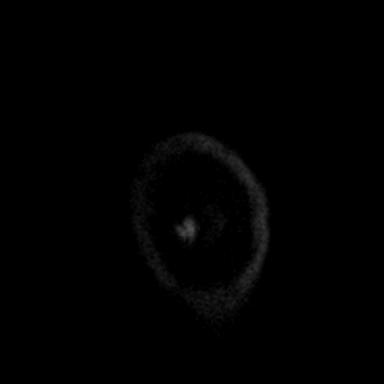

[Series 9: cor dwi_adc · coronal · 5.0mm · 0.60mm/px · 3 of 34 slices shown]
[im 1/34]
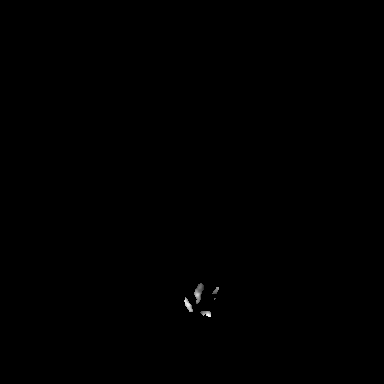
[im 12/34]
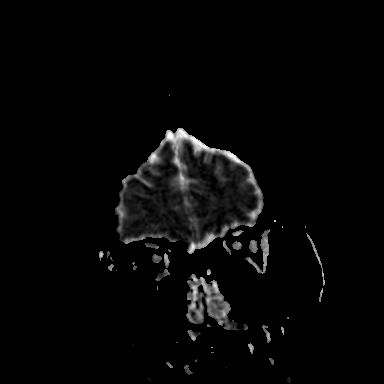
[im 23/34]
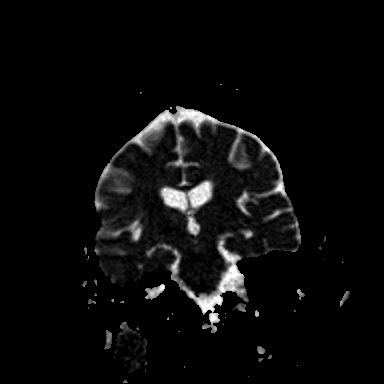

[Series 10: T1 · sagittal · 5.0mm · 0.62mm/px · 3 of 25 slices shown (1 of 2)]
[im 1/25]
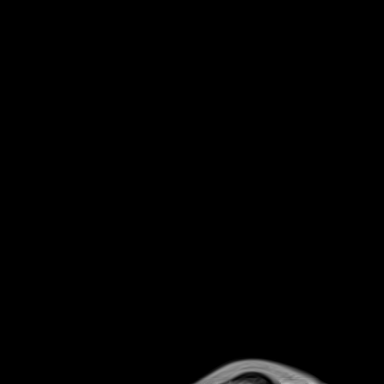
[im 13/25]
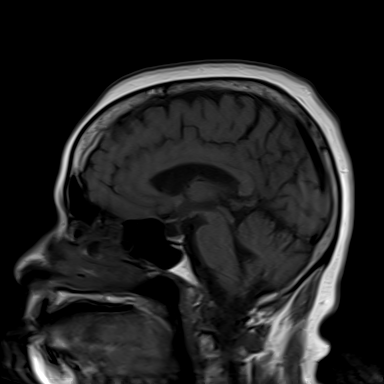
[im 25/25]
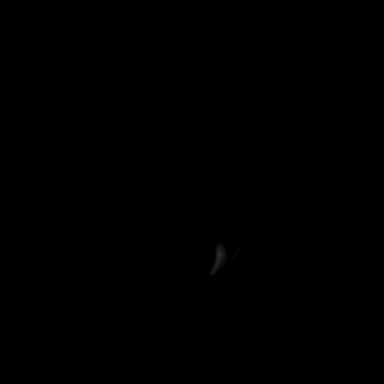

[Series 11: T2 · coronal · 5.0mm · 0.45mm/px · 3 of 31 slices shown (1 of 2)]
[im 1/31]
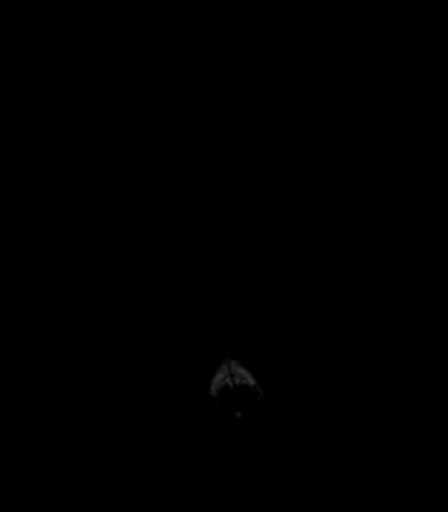
[im 16/31]
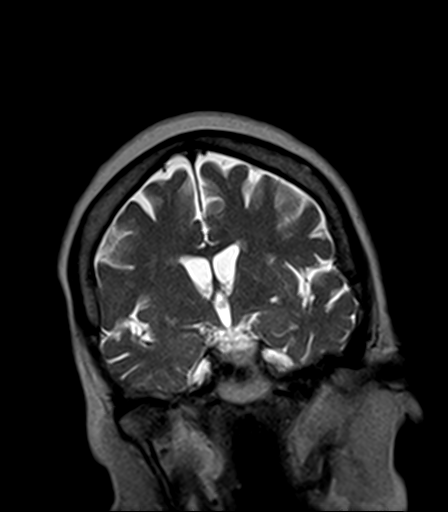
[im 31/31]
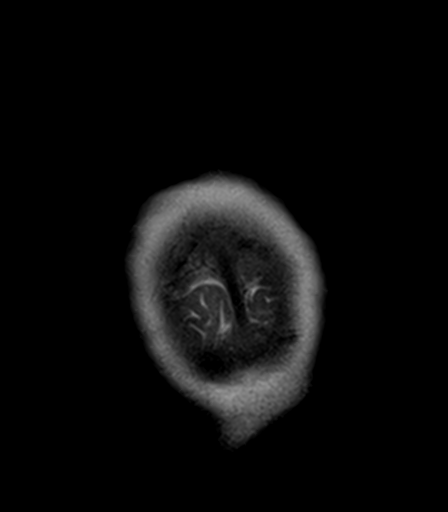

[Series 12: T2 · axial · 5.0mm · 0.53mm/px · z∈[-172,-33]mm · 3 of 25 slices shown (2 of 2)]
[im 1/25]
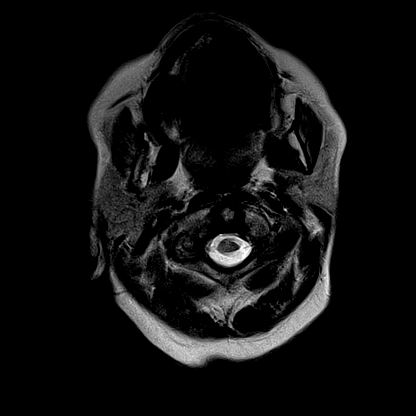
[im 13/25]
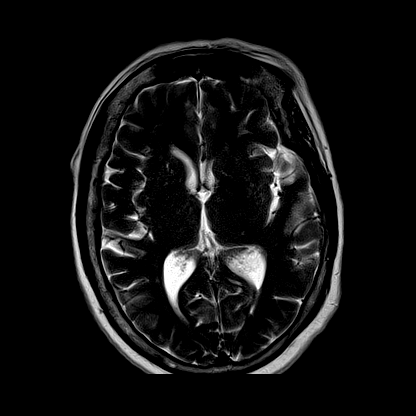
[im 25/25]
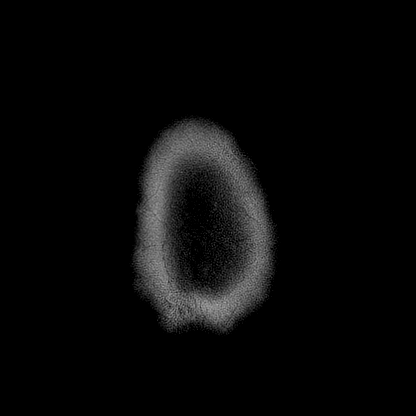

[Series 13: T1 · axial · 5.0mm · 0.90mm/px · z∈[-178,-27]mm · 3 of 27 slices shown (2 of 2)]
[im 1/27]
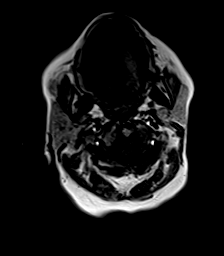
[im 14/27]
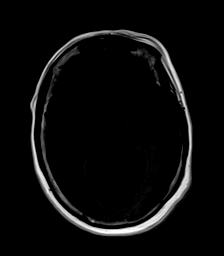
[im 27/27]
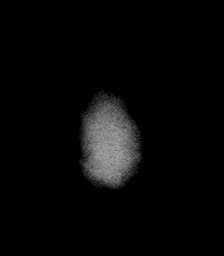

[35 of 48 positions shown; findings below may reference images not displayed]

FINDINGS: Brain: Diffusion imaging does not show any acute or subacute
infarction. No focal abnormality is seen affecting the brainstem or
cerebellum. Minimal small vessel changes seen within the cerebral
hemispheric white matter and basal ganglia, less than often seen at
this age. No cortical or large vessel territory infarction. No mass
lesion, hemorrhage, hydrocephalus or extra-axial collection.

Vascular: Major vessels at the base of the brain show flow.

Skull and upper cervical spine: Negative

Sinuses/Orbits: Clear/normal

Other: None
IMPRESSION: No acute or subacute finding. Minimal small vessel change of the
hemispheric white matter and basal ganglia, less than often seen at
this age.

## 2020-10-09 MED ORDER — DOCUSATE SODIUM 100 MG PO CAPS
200.0000 mg | ORAL_CAPSULE | Freq: Every day | ORAL | Status: DC
  Administered 2020-10-10: 200 mg via ORAL
  Filled 2020-10-09: qty 2

## 2020-10-09 MED ORDER — ALBUTEROL SULFATE HFA 108 (90 BASE) MCG/ACT IN AERS
1.0000 | INHALATION_SPRAY | Freq: Four times a day (QID) | RESPIRATORY_TRACT | Status: DC | PRN
  Filled 2020-10-09: qty 6.7

## 2020-10-09 MED ORDER — ASPIRIN EC 81 MG PO TBEC
81.0000 mg | DELAYED_RELEASE_TABLET | Freq: Every day | ORAL | Status: DC
  Administered 2020-10-10: 81 mg via ORAL
  Filled 2020-10-09: qty 1

## 2020-10-09 MED ORDER — PRAVASTATIN SODIUM 20 MG PO TABS
20.0000 mg | ORAL_TABLET | Freq: Every day | ORAL | Status: DC
  Filled 2020-10-09: qty 1

## 2020-10-09 MED ORDER — ENOXAPARIN SODIUM 40 MG/0.4ML ~~LOC~~ SOLN
40.0000 mg | Freq: Every day | SUBCUTANEOUS | Status: DC
  Administered 2020-10-10: 40 mg via SUBCUTANEOUS
  Filled 2020-10-09: qty 0.4

## 2020-10-09 MED ORDER — ACETAMINOPHEN 325 MG PO TABS: 650.0000 mg | ORAL_TABLET | ORAL | Status: DC | PRN

## 2020-10-09 MED ORDER — GADOBUTROL 1 MMOL/ML IV SOLN
6.0000 mL | Freq: Once | INTRAVENOUS | Status: AC | PRN
  Administered 2020-10-09: 6 mL via INTRAVENOUS

## 2020-10-09 MED ORDER — ANASTROZOLE 1 MG PO TABS
1.0000 mg | ORAL_TABLET | Freq: Every day | ORAL | Status: DC
Start: 2020-10-10 — End: 2020-10-10
  Administered 2020-10-10: 1 mg via ORAL
  Filled 2020-10-09: qty 1

## 2020-10-09 MED ORDER — ACETAMINOPHEN 160 MG/5ML PO SOLN
650.0000 mg | ORAL | Status: DC | PRN
  Filled 2020-10-09: qty 20.3

## 2020-10-09 MED ORDER — CALCIUM CARBONATE-VITAMIN D 500-200 MG-UNIT PO TABS
1.0000 | ORAL_TABLET | Freq: Every day | ORAL | Status: DC
  Administered 2020-10-10: 1 via ORAL
  Filled 2020-10-09: qty 1

## 2020-10-09 MED ORDER — SODIUM CHLORIDE 0.9% FLUSH
3.0000 mL | Freq: Once | INTRAVENOUS | Status: AC
  Administered 2020-10-09: 3 mL via INTRAVENOUS

## 2020-10-09 MED ORDER — PANTOPRAZOLE SODIUM 40 MG PO TBEC
80.0000 mg | DELAYED_RELEASE_TABLET | Freq: Every day | ORAL | Status: DC
  Administered 2020-10-10: 80 mg via ORAL
  Filled 2020-10-09: qty 2

## 2020-10-09 MED ORDER — ACETAMINOPHEN 650 MG RE SUPP: 650.0000 mg | RECTAL | Status: DC | PRN

## 2020-10-09 MED ORDER — METFORMIN HCL 500 MG PO TABS
500.0000 mg | ORAL_TABLET | Freq: Every day | ORAL | Status: DC
  Administered 2020-10-10: 500 mg via ORAL
  Filled 2020-10-09: qty 1

## 2020-10-09 MED ORDER — STROKE: EARLY STAGES OF RECOVERY BOOK: Freq: Once | Status: DC

## 2020-10-09 NOTE — ED Provider Notes (Signed)
[facility] Emergency Department Provider Note   ____________________________________________    I have reviewed the triage vital signs and the nursing notes.   HISTORY  Chief Complaint Weakness     HPI Michelle Henry is a 75 y.o. female with history of CKD, COPD, diabetes, hypertension who was sent over from neurologist office for evaluation for CVA.  Patient apparently developed facial droop with left-sided weakness which was apparently noticed yesterday, unclear time of onset.  She denies headache.  No fevers chills nausea or vomiting.  Past Medical History:  Diagnosis Date  . Blood transfusion   . Cancer ([place])   . Chronic kidney disease   . COPD (chronic obstructive pulmonary disease) ([place])   . Diabetes mellitus   . Dysphagia   . GERD (gastroesophageal reflux disease)   . High cholesterol   . Hypertension   . Shortness of breath     Patient Active Problem List   Diagnosis Date Noted  . Stroke-like episode 10/09/2020  . Confusion 05/19/2020  . Memory change 05/19/2020  . Malignant neoplasm of lower-outer quadrant of left breast of female, estrogen receptor positive ([place]) 01/07/2017  . Breast cancer, left breast ([place]) 12/06/2016  . Breast cancer, left ([place]) 10/31/2016  . History of colonic polyps 03/20/2016  . COPD (chronic obstructive pulmonary disease) ([place]) 12/25/2012  . Stricture and stenosis of esophagus 12/25/2012  . Dysphagia 10/29/2011  . SBO (small bowel obstruction) ([place]) 03/01/2011  . HTN (hypertension) 03/01/2011  . DM2 (diabetes mellitus, type 2) ([place]) 03/01/2011  . Hyperlipemia 03/01/2011  . Tobacco abuse 03/01/2011  . GERD (gastroesophageal reflux disease) 03/01/2011    Past Surgical History:  Procedure Laterality Date  . BREAST SURGERY    . COLONOSCOPY N/A 03/28/2017   Procedure: COLONOSCOPY;  Surgeon: Rogene Houston, MD;  Location: [facility] ENDO SUITE;  Service: Endoscopy;  Laterality: N/A;  1200 - moved to 9/5 @ 10:30   . ESOPHAGOGASTRODUODENOSCOPY (EGD) WITH ESOPHAGEAL DILATION  11/2012   [facility]  . MASTECTOMY  right side  . MASTECTOMY MODIFIED RADICAL Left 12/06/2016   Procedure: MASTECTOMY MODIFIED RADICAL;  Surgeon: [name], MD;  Location: [facility] ORS;  Service: General;  Laterality: Left;  . TONSILLECTOMY      Prior to Admission medications   Medication Sig Start Date End Date Taking? Authorizing Provider  albuterol (PROVENTIL HFA;VENTOLIN HFA) 108 (90 BASE) MCG/ACT inhaler Inhale 1-2 puffs into the lungs every 6 (six) hours as needed for wheezing or shortness of breath.    [provider]  anastrozole (ARIMIDEX) 1 MG tablet TAKE 1 TABLET BY MOUTH ONCE DAILY. Patient taking differently: Take 1 mg by mouth daily.  02/26/19   Derek Jack, MD  aspirin EC 81 MG tablet Take 81 mg by mouth daily after breakfast.     [provider]  beclomethasone (QVAR) 40 MCG/ACT inhaler Inhale 1 puff into the lungs daily.     [provider]  benazepril (LOTENSIN) 20 MG tablet Take 20 mg by mouth daily after breakfast.     [provider]  calcium-vitamin D (OSCAL WITH D) 500-200 MG-UNIT TABS tablet TAKE 2 TABLETS BY MOUTH IN THE MORNING WITH BREAKFAST. 07/20/19   Lockamy, Randi L, NP-C  diphenhydrAMINE (BENADRYL) 25 MG tablet Take 25 mg by mouth every 6 (six) hours as needed (for sinus/allergies.). Patient not taking: Reported on 06/13/2020    [provider]  docusate sodium (COLACE) 100 MG capsule Take 200 mg by mouth daily after breakfast.  [provider]  FEROSUL 325 (65 Fe) MG tablet Take 325 mg by mouth daily.  09/28/18   [provider]  fluticasone (FLONASE) 50 MCG/ACT nasal spray Place 2 sprays into both nostrils as needed.  10/16/18   [provider]  gabapentin (NEURONTIN) 300 MG capsule Take 300 mg by mouth at bedtime.  09/21/18   [provider]  hydrOXYzine (ATARAX/VISTARIL) 25 MG tablet Take 25 mg by mouth every 8 (eight)  hours as needed for itching. Patient not taking: Reported on 06/13/2020 10/16/16   [provider]  ibuprofen (ADVIL) 600 MG tablet Take 600 mg by mouth as needed.  11/17/18   [provider]  Iron-Vitamin C (VITRON-C PO) Take 1 tablet by mouth daily after breakfast. Patient not taking: Reported on 06/13/2020    [provider]  lovastatin (MEVACOR) 20 MG tablet Take 20 mg by mouth at bedtime.    [provider]  metFORMIN (GLUCOPHAGE) 500 MG tablet Take 500 mg by mouth daily after breakfast.  07/27/15   [provider]  Multiple Vitamins-Minerals [facility] ADULTS 50+ SENIOR) TABS Take by mouth daily.  Patient not taking: Reported on 06/13/2020 02/03/19   [provider]  naproxen (NAPROSYN) 500 MG tablet Take 500 mg by mouth every 12 (twelve) hours as needed (for pain.).     [provider]  omeprazole (PRILOSEC) 20 MG capsule Take 20 mg by mouth daily before breakfast.     [provider]  ONE TOUCH ULTRA TEST test strip  09/10/17   [provider]  SPIRIVA RESPIMAT 2.5 MCG/ACT AERS 2 puffs daily.  05/07/18   [provider]     Allergies Patient has no known allergies.  Family History  Problem Relation Age of Onset  . Hypertension Mother   . Kidney disease Mother   . Emphysema Father   . Diabetes Brother   . COPD Brother   . Heart attack Brother     Social History Social History   Tobacco Use  . Smoking status: Current Every Day Smoker    Packs/day: 1.00    Years: 53.00    Pack years: 53.00    Types: Cigarettes  . Smokeless tobacco: Never Used  Vaping Use  . Vaping Use: Never used  Substance Use Topics  . Alcohol use: No  . Drug use: No    Review of Systems  Constitutional: No fever/chills Eyes: No visual changes.  ENT: No sore throat. Cardiovascular: Denies chest pain. Respiratory: Denies shortness of breath. Gastrointestinal: No abdominal pain.  No nausea, no vomiting.    Genitourinary: Negative for dysuria. Musculoskeletal: Negative for back pain. Skin: Negative for rash. Neurological: As above   ____________________________________________   PHYSICAL EXAM:  VITAL SIGNS: ED Triage Vitals [10/09/20 1136]  Enc Vitals Group     BP (!) 152/60     Pulse Rate 77     Resp 18     Temp 97.8 F (36.6 C)     Temp src      SpO2 100 %     Weight 65.8 kg (145 lb)     Height 1.524 m (5')     Head Circumference      Peak Flow      Pain Score 0     Pain Loc      Pain Edu?      Excl. in [place]?     Constitutional: Alert and oriented. Eyes: Conjunctivae are normal.  PERRLA, EOMI Head: Atraumatic. Nose:  No congestion/rhinnorhea. Mouth/Throat: Mucous membranes are moist.   Neck Cardiovascular: Normal rate, regular rhythm. Grossly normal heart sounds.  Good peripheral circulation. Respiratory: Normal respiratory effort.  No retractions. Lungs CTAB. Gastrointestinal: Soft and nontender. No distention.  No CVA tenderness.  Musculoskeletal: No lower extremity tenderness nor edema.  Warm and well perfused Neurologic: Slightly slurred speech, right-sided facial droop, left-sided upper and lower extremity weakness Skin:  Skin is warm, dry and intact. No rash noted. Psychiatric: Mood and affect are normal. Speech and behavior are normal.  ____________________________________________   LABS (all labs ordered are listed, but only abnormal results are displayed)  Labs Reviewed  CBC - Abnormal; Notable for the following components:      Result Value   Hemoglobin 11.6 (*)    HCT 35.1 (*)    All other components within normal limits  SARS CORONAVIRUS 2 (TAT 6-24 HRS)  PROTIME-INR  APTT  DIFFERENTIAL  COMPREHENSIVE METABOLIC PANEL  CBG MONITORING, ED   ____________________________________________  EKG  ED ECG REPORT I, Lavonia Drafts, the attending physician, personally viewed and interpreted this ECG.  Date: 10/09/2020  Rhythm: normal sinus  rhythm QRS Axis: normal Intervals: normal ST/T Wave abnormalities: normal Narrative Interpretation: no evidence of acute ischemia  ____________________________________________  RADIOLOGY  CT head ____________________________________________   PROCEDURES  Procedure(s) performed: No  Procedures   Critical Care performed: No ____________________________________________   INITIAL IMPRESSION / ASSESSMENT AND PLAN / ED COURSE  Pertinent labs & imaging results that were available during my care of the patient were reviewed by me and considered in my medical decision making (see chart for details).  Patient presents with facial droop, left-sided weakness, unclear onset at outside of the window as symptoms apparently started greater than 24 hours ago.  Sent to ED for evaluation by neurologist office.   We will send for CT head, obtain labs  CT head is reassuring, lab work is unremarkable, will admit to the hospital service for further stroke evaluation.    ____________________________________________   FINAL CLINICAL IMPRESSION(S) / ED DIAGNOSES  Final diagnoses:  Cerebrovascular accident (CVA), unspecified mechanism ([place])        Note:  This document was prepared using Dragon voice recognition software and may include unintentional dictation errors.   Lavonia Drafts, MD 10/09/20 1352

## 2020-10-09 NOTE — Progress Notes (Signed)
OT Cancellation Note  Patient Details Name: HETTY LINHART MRN: [id number] DOB: 12-Aug-1945   Cancelled Treatment:    Reason Eval/Treat Not Completed: Patient at procedure or test/ unavailable. Consult received, chart reviewed. Pt noted to be at MRI. Will re-attempt OT evaluation next date as medically appropriate.   Hanley Hays, MPH, MS, OTR/L ascom [phone] 10/09/20, 4:13 PM

## 2020-10-09 NOTE — ED Triage Notes (Addendum)
Pt comes from [facility] living with c/o stroke like symptoms that started yesterday. Family states unsure of what time symptoms started. Pt states the nurse assistant was helping her get dressed and noticed the facial droop and weakness.  Family reports she has noticed slurred speech as well that has continued into today. Pt having mumbled and slurred speech. Pt has noticeable left arm weakness and drift.   Pt states headache yesterday and earlier in week..  Unknown of last known well.

## 2020-10-09 NOTE — H&P (Addendum)
History and Physical   [name] KVQ:[id number] DOB: 17-Feb-1946 DOA: 10/09/2020  PCP: Patient, No Pcp Per  Outpatient Specialists: Dr. Melrose Nakayama Patient coming from: doctor's office  I have personally briefly reviewed patient's old medical records in [place].  Chief Concern: Facial droop  HPI: Michelle Henry is a 75 y.o. female with medical history significant for dementia, history of neurosyphilis, presents to the emergency department for chief concerns of facial droop and left-sided weakness.  Per sister-in-law at bedside, she reports that she noticed slurred speech for 1 month. Patient reports that she has felt weak last month and come and go.  Sister-in-law denies facial asymmetry.  Patient denies fever, chest pain, shortness of breath, abdominal pain, dysuria, hematuria, nausea, vomiting.  She does endorse difficulty walking at times.  However she these are very nonspecific and she is not able to tell me how [place] the symptoms lasted.  However it does sound like she has been having slurred speech for 1 month.  Patient endorses compliance with medications and denies sick contacts.  She denies changes to diet.  And she denies any overt weight loss or weight gain.  Social history: smokes 0.5 ppd, started at 17 years. formelry drank etoh and has not had a drink in years. Denies no recreational drugs  Vaccination: Patient is vaccinated for Covid with 3 vaccines  At bedside, patient does have weakness of the left-sided extremities upper and lower.  ROS: Constitutional: no weight change, no fever ENT/Mouth: no sore throat, no rhinorrhea Eyes: no eye pain, no vision changes Cardiovascular: no chest pain, no dyspnea,  no edema, no palpitations Respiratory: no cough, no sputum, no wheezing Gastrointestinal: no nausea, no vomiting, no diarrhea, no constipation Genitourinary: no urinary incontinence, no dysuria, no hematuria Musculoskeletal: no arthralgias, no myalgias Skin: no skin  lesions, no pruritus, Neuro: + weakness, no loss of consciousness, no syncope Psych: no anxiety, no depression, + decrease appetite Heme/Lymph: no bruising, no bleeding  ED Course: Discussed with ED provider, patient requiring hospitalization due to to possible stroke and requiring further work-up.  Vitals in the ED was remarkable for temperature of 97.8, heart rate of 77, respiration rate of 18, blood pressure of 152/60, satting at 100% on room air.  Assessment/Plan  Active Problems:   Stroke-like episode   Speech changes and new left-sided weakness -She follows with neurology outpatient, [facility], Dr. Vickki Hearing who advised patient to present to the emergency department for stroke work-up -CT of the head -MRI of the head: Was negative for an acute or subacute findings.  Minimal small vessel changes of the hemispheric white matter and basal ganglia, less than often seen at this age. -MRI of the cervical spine with and without contrast ordered for possible syphilis involvement -Complete echo ordered -RPR, B1 12, B1, TSH ordered -Fasting lipid panel and A1c -CBC, BMP in the a.m. -Would recommend a.m. team to consult neurology -Resumed aspirin 81 mg daily -Admit to MedSurg, observation, with telemetry  History of neuro syphilis-RPR check, recommend a.m. team to consult infectious disease if further indicated Non-insulin-dependent diabetes mellitus-Metformin 500 mg daily Hyperlipidemia-pravastatin 20 mg daily History of breast cancer-anastrozole 1 mg  Chart reviewed.   DVT prophylaxis: Enoxaparin 40 mg subcutaneous, SCDs Code Status: Full code Diet: N.p.o. pending speech Family Communication: Updated sister-in-law at bedside Disposition Plan: Pending clinical course Consults called: None at this time Admission status: Observation, MedSurg, with telemetry  Past Medical History:  Diagnosis Date  . Blood transfusion   .  Cancer ([place])   . Chronic kidney disease    . COPD (chronic obstructive pulmonary disease) ([place])   . Diabetes mellitus   . Dysphagia   . GERD (gastroesophageal reflux disease)   . High cholesterol   . Hypertension   . Shortness of breath    Past Surgical History:  Procedure Laterality Date  . BREAST SURGERY    . COLONOSCOPY N/A 03/28/2017   Procedure: COLONOSCOPY;  Surgeon: Rogene Houston, MD;  Location: [facility] ENDO SUITE;  Service: Endoscopy;  Laterality: N/A;  1200 - moved to 9/5 @ 10:30  . ESOPHAGOGASTRODUODENOSCOPY (EGD) WITH ESOPHAGEAL DILATION  11/2012   [facility]  . MASTECTOMY  right side  . MASTECTOMY MODIFIED RADICAL Left 12/06/2016   Procedure: MASTECTOMY MODIFIED RADICAL;  Surgeon: [name], MD;  Location: [facility] ORS;  Service: General;  Laterality: Left;  . TONSILLECTOMY     Social History:  reports that she has been smoking cigarettes. She has a 53.00 pack-year smoking history. She has never used smokeless tobacco. She reports that she does not drink alcohol and does not use drugs.  No Known Allergies Family History  Problem Relation Age of Onset  . Hypertension Mother   . Kidney disease Mother   . Emphysema Father   . Diabetes Brother   . COPD Brother   . Heart attack Brother    Family history: Family history reviewed and not pertinent  Prior to Admission medications   Medication Sig Start Date End Date Taking? Authorizing Provider  albuterol (PROVENTIL HFA;VENTOLIN HFA) 108 (90 BASE) MCG/ACT inhaler Inhale 1-2 puffs into the lungs every 6 (six) hours as needed for wheezing or shortness of breath.    [provider]  anastrozole (ARIMIDEX) 1 MG tablet TAKE 1 TABLET BY MOUTH ONCE DAILY. Patient taking differently: Take 1 mg by mouth daily.  02/26/19   Derek Jack, MD  aspirin EC 81 MG tablet Take 81 mg by mouth daily after breakfast.     [provider]  beclomethasone (QVAR) 40 MCG/ACT inhaler Inhale 1 puff into the lungs daily.     [provider]  benazepril (LOTENSIN) 20 MG  tablet Take 20 mg by mouth daily after breakfast.     [provider]  calcium-vitamin D (OSCAL WITH D) 500-200 MG-UNIT TABS tablet TAKE 2 TABLETS BY MOUTH IN THE MORNING WITH BREAKFAST. 07/20/19   Lockamy, Randi L, NP-C  diphenhydrAMINE (BENADRYL) 25 MG tablet Take 25 mg by mouth every 6 (six) hours as needed (for sinus/allergies.). Patient not taking: Reported on 06/13/2020    [provider]  docusate sodium (COLACE) 100 MG capsule Take 200 mg by mouth daily after breakfast.    [provider]  FEROSUL 325 (65 Fe) MG tablet Take 325 mg by mouth daily.  09/28/18   [provider]  fluticasone (FLONASE) 50 MCG/ACT nasal spray Place 2 sprays into both nostrils as needed.  10/16/18   [provider]  gabapentin (NEURONTIN) 300 MG capsule Take 300 mg by mouth at bedtime.  09/21/18   [provider]  hydrOXYzine (ATARAX/VISTARIL) 25 MG tablet Take 25 mg by mouth every 8 (eight) hours as needed for itching. Patient not taking: Reported on 06/13/2020 10/16/16   [provider]  ibuprofen (ADVIL) 600 MG tablet Take 600 mg by mouth as needed.  11/17/18   [provider]  Iron-Vitamin C (VITRON-C PO) Take 1 tablet by mouth daily after breakfast. Patient not taking: Reported on 06/13/2020    [provider]  lovastatin (MEVACOR) 20 MG tablet Take 20 mg by mouth at bedtime.    [provider]  metFORMIN (GLUCOPHAGE) 500 MG tablet Take 500 mg by mouth daily after breakfast.  07/27/15   [provider]  Multiple Vitamins-Minerals [facility] ADULTS 50+ SENIOR) TABS Take by mouth daily.  Patient not taking: Reported on 06/13/2020 02/03/19   [provider]  naproxen (NAPROSYN) 500 MG tablet Take 500 mg by mouth every 12 (twelve) hours as needed (for pain.).     [provider]  omeprazole (PRILOSEC) 20 MG capsule Take 20 mg by mouth daily before breakfast.     [provider]  ONE TOUCH ULTRA  TEST test strip  09/10/17   [provider]  SPIRIVA RESPIMAT 2.5 MCG/ACT AERS 2 puffs daily.  05/07/18   [provider]   Physical Exam: Vitals:   10/09/20 1136  BP: (!) 152/60  Pulse: 77  Resp: 18  Temp: 97.8 F (36.6 C)  SpO2: 100%  Weight: 65.8 kg  Height: 5' (1.524 m)   Constitutional: appears younger than chronological age, NAD, calm, comfortable, no facial asymmetry noted Eyes: PERRL, lids and conjunctivae normal ENMT: Mucous membranes are moist. Posterior pharynx clear of any exudate or lesions. Age-appropriate dentition. Hearing appropriate Neck: normal, supple, no masses, no thyromegaly Respiratory: clear to auscultation bilaterally, no wheezing, no crackles. Normal respiratory effort. No accessory muscle use.  Cardiovascular: Regular rate and rhythm, no murmurs / rubs / gallops. No extremity edema. 2+ pedal pulses. No carotid bruits.  Abdomen: no tenderness, no masses palpated, no hepatosplenomegaly. Bowel sounds positive.  Musculoskeletal: no clubbing / cyanosis. No joint deformity upper and lower extremities. Good ROM, no contractures, no atrophy. Normal muscle tone.  Skin: no rashes, lesions, ulcers. No induration Neurologic: Sensation intact. Strength 5/5 in all right upper and lower extremity.  Strength is 3 out of 5 of the left upper and lower extremity. Psychiatric: Normal judgment and insight. Alert and oriented x 3. Normal mood.   EKG: independently reviewed, showing sinus rhythm with rate of 85, QTc 460  Chest x-ray on Admission: I personally reviewed and I agree with radiologist reading as below.  CT HEAD WO CONTRAST  Result Date: 10/09/2020 CLINICAL DATA:  Slurred speech, facial droop with left upper extremity weakness and drift since yesterday. No reported injury. EXAM: CT HEAD WITHOUT CONTRAST TECHNIQUE: Contiguous axial images were obtained from the base of the skull through the vertex without intravenous contrast. COMPARISON:  07/26/2020  brain MRI. FINDINGS: Brain: No evidence of parenchymal hemorrhage or extra-axial fluid collection. No mass lesion, mass effect, or midline shift. No CT evidence of acute infarction. Nonspecific mild subcortical and periventricular white matter hypodensity, most in keeping with chronic small vessel ischemic change. Cerebral volume is age appropriate. No ventriculomegaly. Vascular: No acute abnormality. Skull: No evidence of calvarial fracture. Sinuses/Orbits: The visualized paranasal sinuses are essentially clear. Other:  The mastoid air cells are unopacified. IMPRESSION: 1. No evidence of acute intracranial abnormality. 2. Mild chronic small vessel ischemic changes in the cerebral white matter. Electronically Signed   By: Ilona Sorrel M.D.   On: 10/09/2020 12:38   Labs on Admission: I have personally reviewed following labs  CBC: Recent Labs  Lab 10/09/20 1143  WBC 5.7  NEUTROABS 3.5  HGB 11.6*  HCT 35.1*  MCV 89.5  PLT 944   Basic Metabolic Panel: Recent Labs  Lab 10/09/20 1143  NA 138  K 4.4  CL 105  CO2 25  GLUCOSE 99  BUN 14  CREATININE 0.89  CALCIUM 9.8   GFR: Estimated Creatinine Clearance: 48.3 mL/min (by C-G formula based on SCr of 0.89 mg/dL).  Liver Function Tests: Recent Labs  Lab 10/09/20 1143  AST 20  ALT 13  ALKPHOS 66  BILITOT 0.6  PROT 7.6  ALBUMIN 4.1   Coagulation Profile: Recent Labs  Lab 10/09/20 1143  INR 1.1   CBG: Recent Labs  Lab 10/09/20 1142  GLUCAP 93   Urine analysis:    Component Value Date/Time   COLORURINE YELLOW 03/01/2011 [place] 03/01/2011 1617   LABSPEC 1.025 03/01/2011 1617   PHURINE 6.0 03/01/2011 1617   GLUCOSEU NEGATIVE 03/01/2011 1617   HGBUR NEGATIVE 03/01/2011 1617   BILIRUBINUR NEGATIVE 03/01/2011 1617   KETONESUR NEGATIVE 03/01/2011 1617   PROTEINUR NEGATIVE 03/01/2011 1617   UROBILINOGEN 0.2 03/01/2011 1617   NITRITE NEGATIVE 03/01/2011 1617   LEUKOCYTESUR NEGATIVE 03/01/2011 1617    Kodi Steil N Oron Westrup D.O. [facility]  If 7PM-7AM, please contact overnight-coverage provider If 7AM-7PM, please contact day coverage provider [email or web address]  10/09/2020, 2:01 PM

## 2020-10-10 ENCOUNTER — Observation Stay (HOSPITAL_BASED_OUTPATIENT_CLINIC_OR_DEPARTMENT_OTHER)
Admit: 2020-10-10 | Discharge: 2020-10-10 | Disposition: A | Discharge status: Home / Self Care | Payer: 59 | Attending: Internal Medicine | Admitting: Internal Medicine

## 2020-10-10 ENCOUNTER — Observation Stay: Payer: 59

## 2020-10-10 DIAGNOSIS — E119 Type 2 diabetes mellitus without complications: Secondary | ICD-10-CM

## 2020-10-10 DIAGNOSIS — I10 Essential (primary) hypertension: Secondary | ICD-10-CM | POA: Diagnosis not present

## 2020-10-10 DIAGNOSIS — I69992 Facial weakness following unspecified cerebrovascular disease: Secondary | ICD-10-CM | POA: Diagnosis not present

## 2020-10-10 DIAGNOSIS — R2990 Unspecified symptoms and signs involving the nervous system: Secondary | ICD-10-CM | POA: Diagnosis not present

## 2020-10-10 DIAGNOSIS — R0600 Dyspnea, unspecified: Secondary | ICD-10-CM | POA: Diagnosis not present

## 2020-10-10 DIAGNOSIS — M4802 Spinal stenosis, cervical region: Secondary | ICD-10-CM

## 2020-10-10 LAB — LIPID PANEL
Cholesterol: 122 mg/dL (ref 0–200)
HDL: 44 mg/dL (ref >40)
LDL Cholesterol: 56 mg/dL (ref 0–99)
Total CHOL/HDL Ratio: 2.8 RATIO
Triglycerides: 111 mg/dL (ref <150)
VLDL: 22 mg/dL (ref 0–40)

## 2020-10-10 LAB — CBC
HCT: 36.2 % (ref 36.0–46.0)
Hemoglobin: 12 g/dL (ref 12.0–15.0)
MCH: 29.6 pg (ref 26.0–34.0)
MCHC: 33.1 g/dL (ref 30.0–36.0)
MCV: 89.2 fL (ref 80.0–100.0)
Platelets: 237 10*3/uL (ref 150–400)
RBC: 4.06 MIL/uL (ref 3.87–5.11)
RDW: 12.8 % (ref 11.5–15.5)
WBC: 4.9 10*3/uL (ref 4.0–10.5)
nRBC: 0 % (ref 0.0–0.2)

## 2020-10-10 LAB — ECHOCARDIOGRAM COMPLETE
AR max vel: 2.12 cm2
AV Area VTI: 2.28 cm2
AV Area mean vel: 2.06 cm2
AV Mean grad: 2 mmHg
AV Peak grad: 3.6 mmHg
Ao pk vel: 0.95 m/s
Area-P 1/2: 5.06 cm2
Height: 60 in
S' Lateral: 2.28 cm
Weight: 2320 oz

## 2020-10-10 LAB — COMPREHENSIVE METABOLIC PANEL
ALT: 15 U/L (ref 0–44)
AST: 18 U/L (ref 15–41)
Albumin: 3.8 g/dL (ref 3.5–5.0)
Alkaline Phosphatase: 60 U/L (ref 38–126)
Anion gap: 8 (ref 5–15)
BUN: 13 mg/dL (ref 8–23)
CO2: 26 mmol/L (ref 22–32)
Calcium: 9.8 mg/dL (ref 8.9–10.3)
Chloride: 106 mmol/L (ref 98–111)
Creatinine, Ser: 0.87 mg/dL (ref 0.44–1.00)
GFR, Estimated: >60 mL/min (ref >60)
Glucose, Bld: 105 mg/dL — ABNORMAL HIGH (ref 70–99)
Potassium: 4.3 mmol/L (ref 3.5–5.1)
Sodium: 140 mmol/L (ref 135–145)
Total Bilirubin: 0.7 mg/dL (ref 0.3–1.2)
Total Protein: 7.2 g/dL (ref 6.5–8.1)

## 2020-10-10 LAB — HEMOGLOBIN A1C
Hgb A1c MFr Bld: 6.5 % — ABNORMAL HIGH (ref 4.8–5.6)
Mean Plasma Glucose: 139.85 mg/dL

## 2020-10-10 IMAGING — US US EXTREM LOW VENOUS*L*
1 series · 13 of 24 positions shown · non-contrast
Comparison: None.

CLINICAL DATA: Left lower extremity edema. History of smoking.
History of breast cancer. Evaluate for DVT.



[Series 1: us venous img lower uni left (dvt) · portal-venous · 13 of 44 slices shown]
[im 1/44]
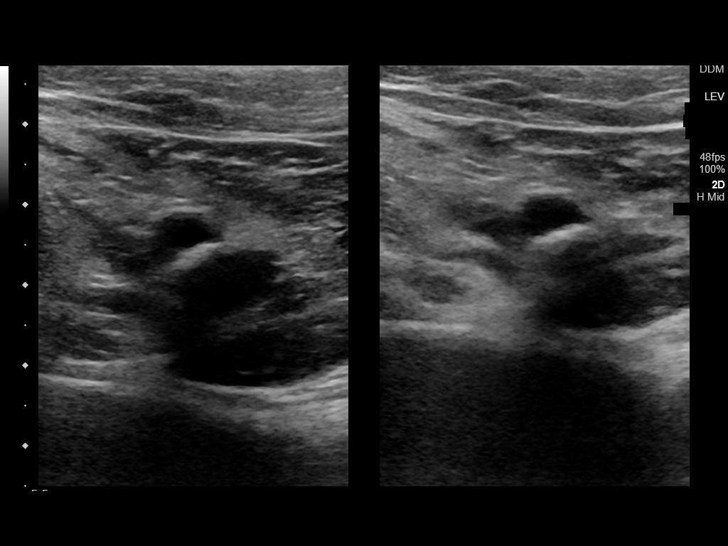
[im 4/44]
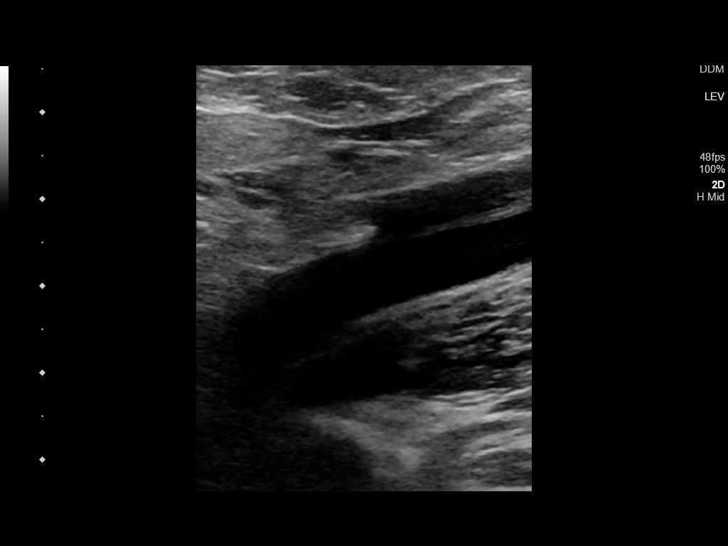
[im 8/44]
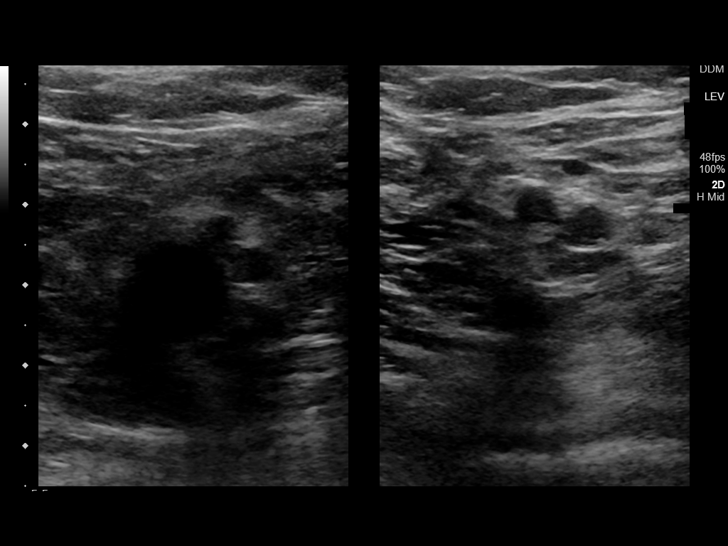
[im 12/44]
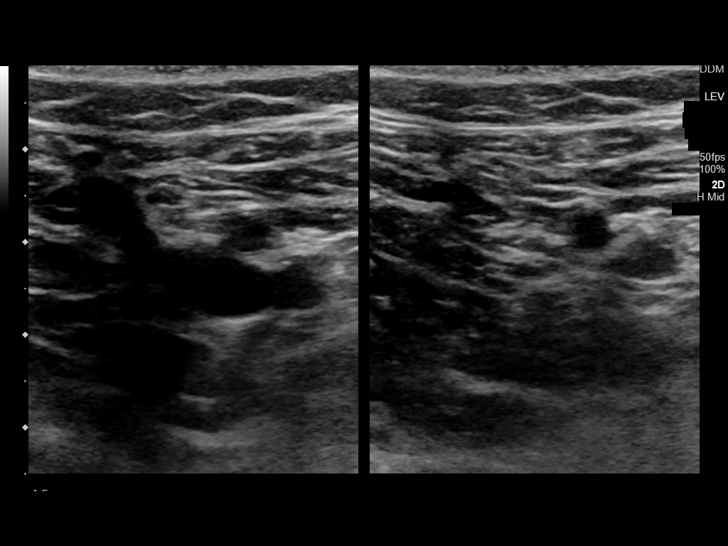
[im 15/44]
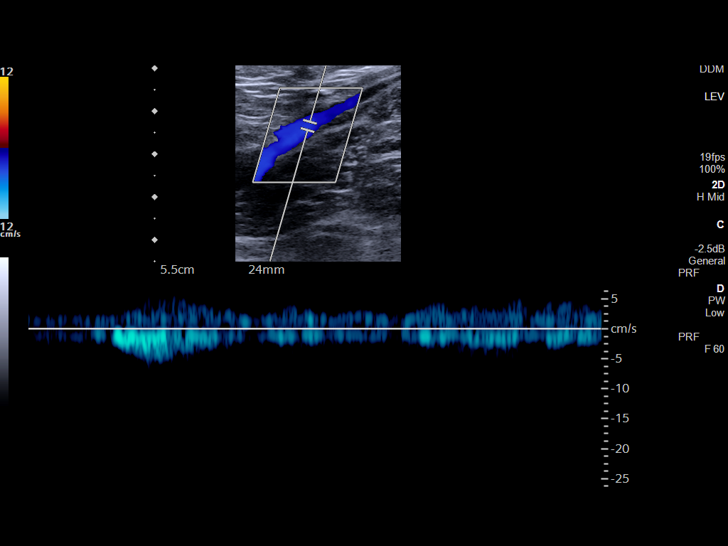
[im 19/44]
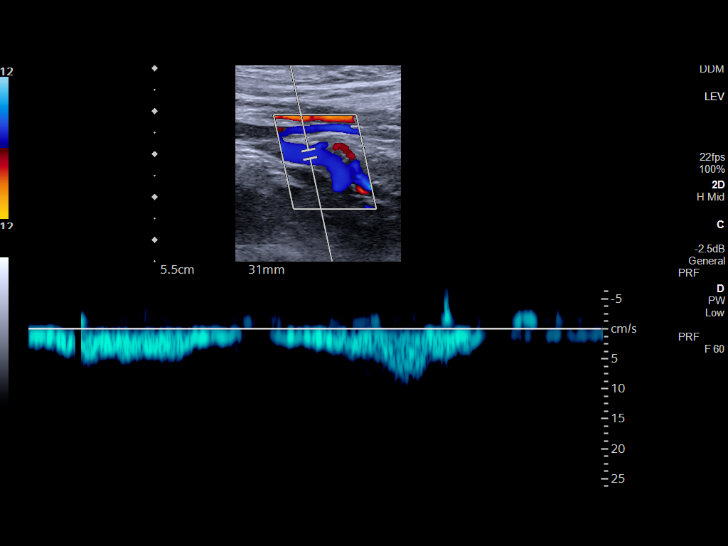
[im 23/44]
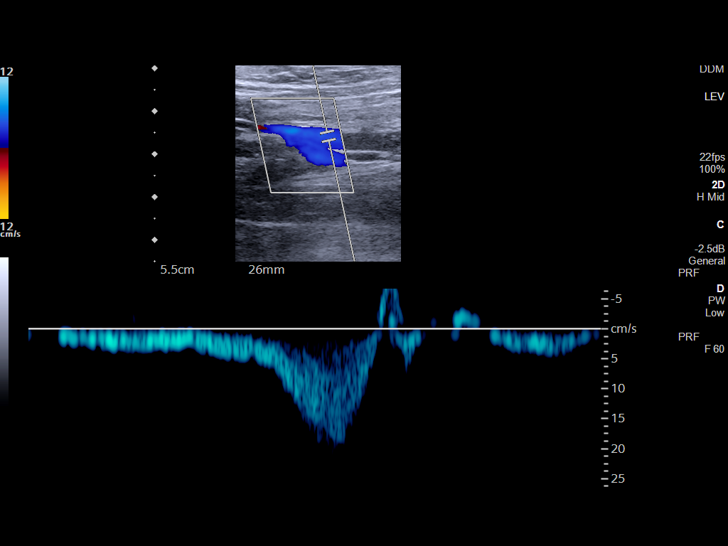
[im 25/44]
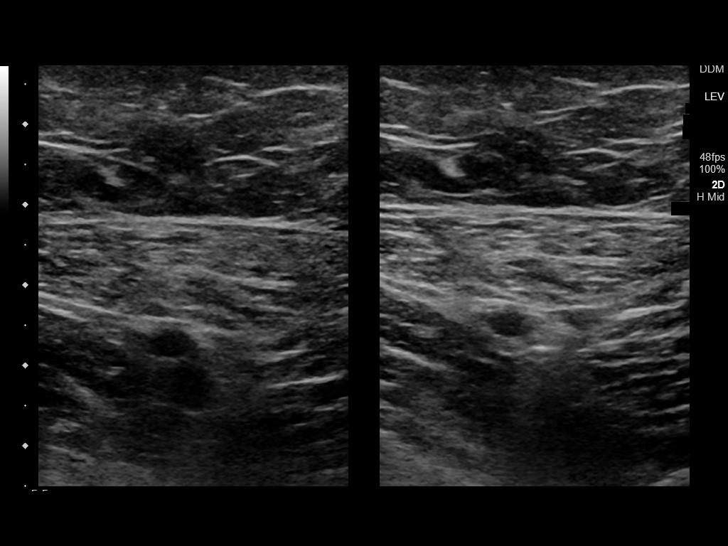
[im 29/44]
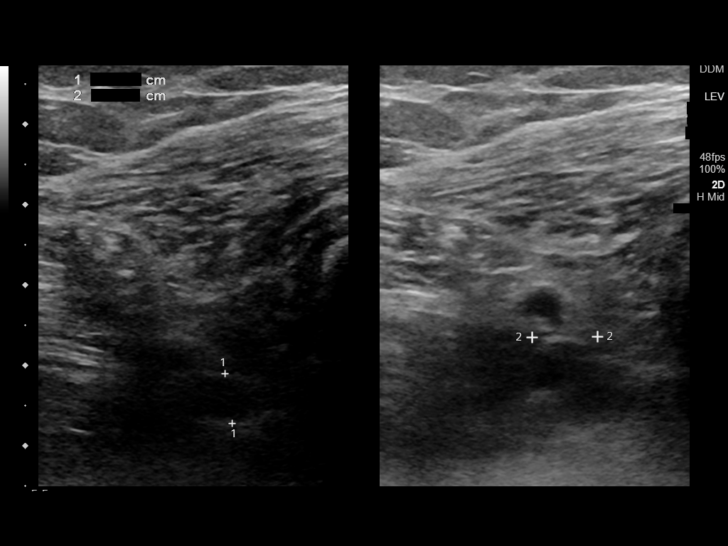
[im 32/44]
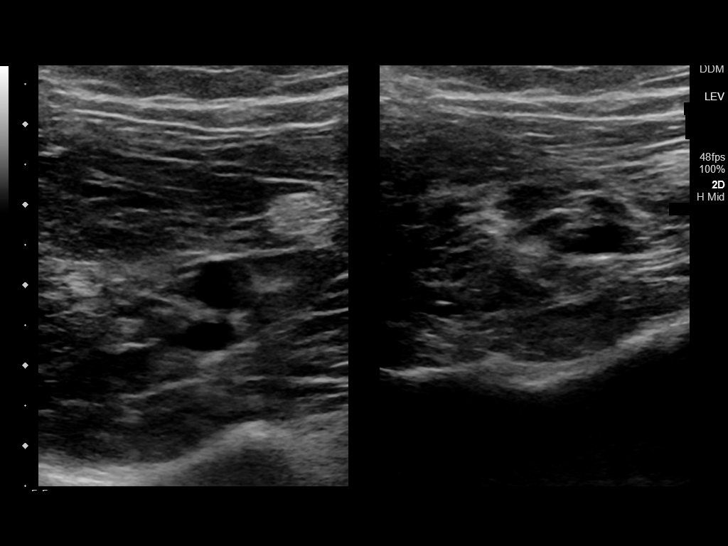
[im 36/44]
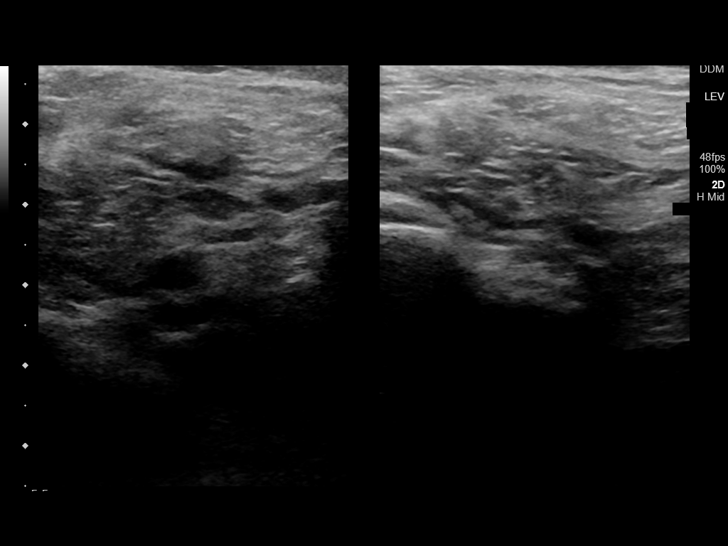
[im 40/44]
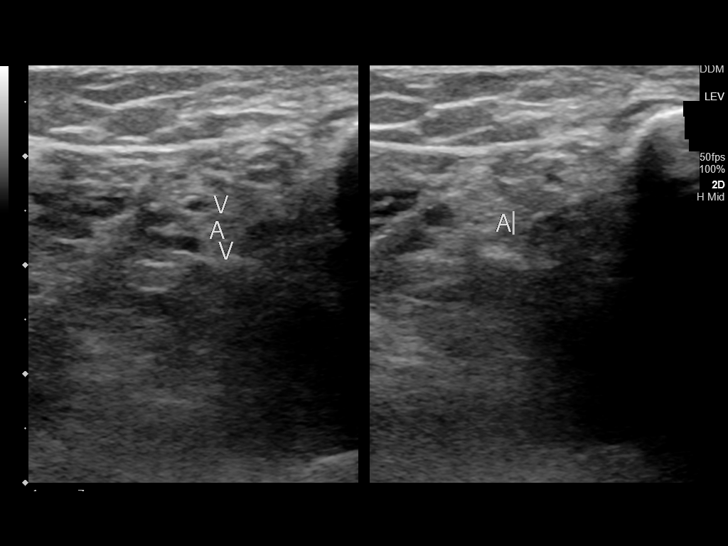
[im 44/44]
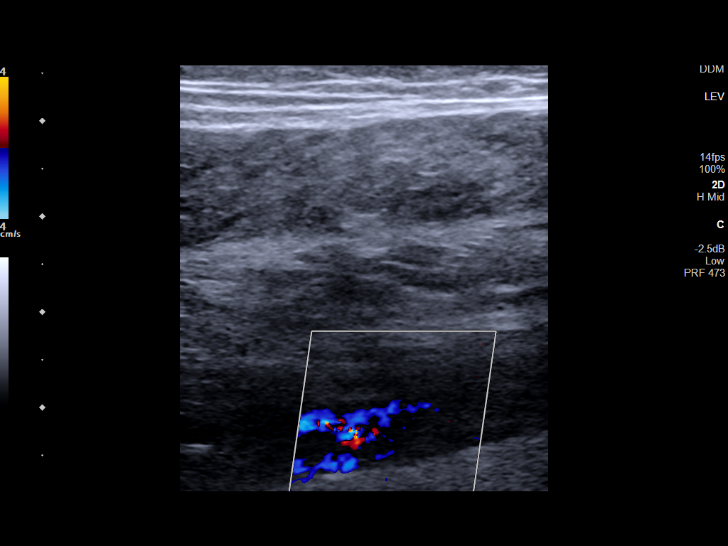

[13 of 24 positions shown; findings below may reference images not displayed]

FINDINGS: Contralateral Common Femoral Vein: Respiratory phasicity is normal
and symmetric with the symptomatic side. No evidence of thrombus.
Normal compressibility.

Common Femoral Vein: No evidence of thrombus. Normal
compressibility, respiratory phasicity and response to augmentation.

Saphenofemoral Junction: No evidence of thrombus. Normal
compressibility and flow on color Doppler imaging.

Profunda Femoral Vein: No evidence of thrombus. Normal
compressibility and flow on color Doppler imaging.

Femoral Vein: No evidence of thrombus. Normal compressibility,
respiratory phasicity and response to augmentation.

Popliteal Vein: No evidence of thrombus. Normal compressibility,
respiratory phasicity and response to augmentation.

Calf Veins: No evidence of thrombus. Normal compressibility and flow
on color Doppler imaging.

Superficial Great Saphenous Vein: No evidence of thrombus. Normal
compressibility.

Venous Reflux:  None.

Other Findings:  None.
IMPRESSION: No evidence of DVT within the left lower extremity.

## 2020-10-10 MED ORDER — ALBUTEROL SULFATE HFA 108 (90 BASE) MCG/ACT IN AERS
1.0000 | INHALATION_SPRAY | Freq: Four times a day (QID) | RESPIRATORY_TRACT | Status: AC | PRN
Start: 2020-10-10 — End: ?

## 2020-10-10 NOTE — Progress Notes (Signed)
PT Cancellation Note  Patient Details Name: Michelle Henry MRN: [id number] DOB: 1945/12/19   Cancelled Treatment:    Reason Eval/Treat Not Completed: Other (comment): Upon entering room pt found with nursing preparing for discharge back to ALF. Per nursing no PT evaluation needed at this time.  Will attempt to see pt at a future date/time if there is a change in status.      Linus Salmons PT, DPT 10/10/20, 2:11 PM

## 2020-10-10 NOTE — Evaluation (Signed)
Occupational Therapy Evaluation Patient Details Name: Michelle Henry MRN: [id number] DOB: 21-Sep-1945 Today's Date: 10/10/2020    History of Present Illness 75 y.o. female with medical history significant for dementia, history of neurosyphilis, CKD, COPD, DM, dysphasia, stricture and stenosis of esophagus, GERD, HTN, HLD, and hx of breast cancer, presents to the emergency department from [place] for chief concerns of facial droop and left-sided weakness. Sister in law notes slurred speech x29mo. Pt reports feeling intermittently weak x 66mo. Patient endorses compliance with medications and denies sick contacts.  She denies changes to diet.  And she denies any overt weight loss or weight gain.   Clinical Impression   Pt seen for OT evaluation this date. Prior to hospital admission, pt was mod indep with QC for mobility at ALF, supervision for seated shower with assist for washing back/hair. Denies falls. Reports decreased functional use of L side over past month or so but still able to walk and do for herself generally. ALF provides assist for meals, meds, transportation, and housekeeping. Pt alert and oriented x4, following all commands. Pt does endorse having dementia during session. Pt currently demonstrates impairments in L side strength and FMC/GMC. Decreased AROM L shoulder (chronic pain) and decreased AROM dorsiflexion on L. Pt requires MIN A for UB/LB ADL, ADL transfers with QC, and demonstrates difficulty with standing balance. Pt is eager to return to her PLOF with increased independence/safety. Pt does not wish to go to rehab. However, given recent functional decline and increased risk of falls, currently recommending short term rehab prior to return to ALF to maximize recovery/safety. Will re-assess for d/c rec pending additional progress with therapy.    Follow Up Recommendations  SNF    Equipment Recommendations  None recommended by OT    Recommendations for Other  Services       Precautions / Restrictions Precautions Precautions: Fall Restrictions Weight Bearing Restrictions: No      Mobility Bed Mobility Overal bed mobility: Needs Assistance Bed Mobility: Supine to Sit;Sit to Supine     Supine to sit: Min assist;HOB elevated Sit to supine: Min assist   General bed mobility comments: increased time/effort    Transfers Overall transfer level: Needs assistance Equipment used: 1 person hand held assist;Quad cane Transfers: Sit to/from Stand Sit to Stand: Min assist;From elevated surface              Balance Overall balance assessment: Needs assistance Sitting-balance support: No upper extremity supported;Feet unsupported Sitting balance-Leahy Scale: Fair     Standing balance support: Bilateral upper extremity supported;Single extremity supported Standing balance-Leahy Scale: Fair                             ADL either performed or assessed with clinical judgement   ADL Overall ADL's : Needs assistance/impaired         Upper Body Bathing: Sitting;Minimal assistance   Lower Body Bathing: Sit to/from stand;Minimal assistance   Upper Body Dressing : Sitting;Minimal assistance   Lower Body Dressing: Sit to/from stand;Minimal assistance   Toilet Transfer: BSC;Stand-pivot;Minimal assistance                   Vision Baseline Vision/History: Wears glasses Wears Glasses: Reading only Patient Visual Report: No change from baseline       Perception     Praxis      Pertinent Vitals/Pain Pain Assessment: No/denies pain     Hand Dominance Right  Extremity/Trunk Assessment Upper Extremity Assessment Upper Extremity Assessment: Generalized weakness;LUE deficits/detail;RUE deficits/detail RUE Deficits / Details: grossly 4+/5 RUE Sensation: WNL LUE Deficits / Details: hx L shoulder chronic pain per pt, grossly 3/5 shoulder otherwise 4/5 LUE Sensation: WNL LUE Coordination: decreased fine  motor;decreased gross motor   Lower Extremity Assessment Lower Extremity Assessment: Generalized weakness;LLE deficits/detail LLE Deficits / Details: difficulty with full AROM, decr active DF, impaired GMC/FMC LLE Sensation: WNL LLE Coordination: decreased fine motor;decreased gross motor   Cervical / Trunk Assessment Cervical / Trunk Assessment: Normal   Communication Communication Communication: No difficulties;Other (comment) (no teeth)   Cognition Arousal/Alertness: Awake/alert Behavior During Therapy: WFL for tasks assessed/performed Overall Cognitive Status: Within Functional Limits for tasks assessed                                 General Comments: pt alert and oriented, follows all commands, she does endorse having dementia during session   General Comments       Exercises Other Exercises Other Exercises: pt able to don shoes and socks seated EOB with set up and CGA, Min A to complete LB ADL in standing   Shoulder Instructions      Home Living Family/patient expects to be discharged to:: Assisted living                   Bathroom Shower/Tub: Walk-in shower         Home Equipment: Clinical cytogeneticist - 2 wheels;Cane - single point;Cane - quad;Hand held shower head          Prior Functioning/Environment Level of Independence: Needs assistance  Gait / Transfers Assistance Needed: pt ambulates with small base QC ADL's / Homemaking Assistance Needed: Pt indep with toileting, setup and supervision assist for seated shower (help for washing hair, back); staff assist with IADL including meds, transportation   Comments: pt denies falls        OT Problem List: Decreased strength;Decreased coordination;Decreased range of motion;Decreased cognition;Decreased activity tolerance;Decreased knowledge of use of DME or AE;Impaired balance (sitting and/or standing);Impaired UE functional use      OT Treatment/Interventions: Self-care/ADL  training;Therapeutic exercise;Therapeutic activities;Neuromuscular education;DME and/or AE instruction;Patient/family education;Balance training    OT Goals(Current goals can be found in the care plan section) Acute Rehab OT Goals Patient Stated Goal: go back to ALF OT Goal Formulation: With patient Time For Goal Achievement: 10/24/20 Potential to Achieve Goals: Good ADL Goals Pt Will Perform Lower Body Dressing: with supervision;sit to/from stand;with set-up Pt Will Transfer to Toilet: with min guard assist;ambulating (LRAD For amb) Additional ADL Goal #1: Pt will perform seated sponge bath with set up and supervision (assist for washing back as per baseline).  OT Frequency: Min 2X/week   Barriers to D/C:            Co-evaluation              AM-PAC OT "6 Clicks" Daily Activity     Outcome Measure Help from another person eating meals?: A Little Help from another person taking care of personal grooming?: A Little Help from another person toileting, which includes using toliet, bedpan, or urinal?: A Little Help from another person bathing (including washing, rinsing, drying)?: A Little Help from another person to put on and taking off regular upper body clothing?: A Little Help from another person to put on and taking off regular lower body clothing?: A Little 6 Click Score: 18  End of Session Equipment Utilized During Treatment: Gait belt  Activity Tolerance: Patient tolerated treatment well Patient left: in bed;with call bell/phone within reach  OT Visit Diagnosis: Other abnormalities of gait and mobility (R26.89);Hemiplegia and hemiparesis;Muscle weakness (generalized) (M62.81) Hemiplegia - Right/Left: Left Hemiplegia - dominant/non-dominant: Non-Dominant Hemiplegia - caused by: Unspecified                Time: 8016-5537 OT Time Calculation (min): 28 min Charges:  OT General Charges $OT Visit: 1 Visit OT Evaluation $OT Eval Moderate Complexity: 1 Mod OT  Treatments $Self Care/Home Management : 8-22 mins  Hanley Hays, MPH, MS, OTR/L ascom [phone] 10/10/20, 1:42 PM

## 2020-10-10 NOTE — Discharge Summary (Addendum)
Physician Discharge Summary  Patient ID: Michelle Henry MRN: [id number] DOB/AGE: 1946-05-24 75 y.o.  Admit date: 10/09/2020 Discharge date: 10/10/2020  Admission Diagnoses:  Discharge Diagnoses:  Active Problems:   Stroke-like episode   Diabetes mellitus type 2, noninsulin dependent ([place]) Cervical spinal stenosis  Discharged Condition: fair  Hospital Course:  Michelle Henry is a 75 y.o. female with medical history significant for dementia, history of neurosyphilis, presents to the emergency department for chief concerns of facial droop and left-sided weakness.  Symptoms started Over a month ago.  Stroke work-up was obtained, MRI brain did not show any acute or subacute stroke, chronic changes noted.  Cervical MRI showed cervical spine stenosis, explaining patient's symptoms.  At this point, patient be referred outpatient to neurosurgery.  She is medically stable to be discharged.  I discussed with neurology, he agrees with my decision.   Consults:   Significant Diagnostic Studies:  MRI HEAD WITHOUT CONTRAST  TECHNIQUE: Multiplanar, multiecho pulse sequences of the brain and surrounding structures were obtained without intravenous contrast.  COMPARISON:  Head CT same day.  MRI 07/26/2020.  FINDINGS: Brain: Diffusion imaging does not show any acute or subacute infarction. No focal abnormality is seen affecting the brainstem or cerebellum. Minimal small vessel changes seen within the cerebral hemispheric white matter and basal ganglia, less than often seen at this age. No cortical or large vessel territory infarction. No mass lesion, hemorrhage, hydrocephalus or extra-axial collection.  Vascular: Major vessels at the base of the brain show flow.  Skull and upper cervical spine: Negative  Sinuses/Orbits: Clear/normal  Other: None  IMPRESSION: No acute or subacute finding. Minimal small vessel change of the hemispheric white matter and basal ganglia, less than often  seen at this age.   Electronically Signed   By: Nelson Chimes M.D.   On: 10/09/2020 16:32  MRI CERVICAL SPINE WITHOUT AND WITH CONTRAST  TECHNIQUE: Multiplanar and multiecho pulse sequences of the cervical spine, to include the craniocervical junction and cervicothoracic junction, were obtained without and with intravenous contrast.  CONTRAST:  12mL GADAVIST GADOBUTROL 1 MMOL/ML IV SOLN  COMPARISON:  None available.  FINDINGS: Alignment: Straightening with mild reversal of the normal cervical lordosis. No listhesis.  Vertebrae: Minimal chronic height loss noted at the superior endplates of T2 and T3. Vertebral body height otherwise maintained without evidence for acute or subacute fracture. Bone marrow signal intensity within normal limits. No discrete or worrisome osseous lesions. No abnormal marrow edema or enhancement.  Cord: Normal signal and morphology.  No abnormal enhancement.  Posterior Fossa, vertebral arteries, paraspinal tissues: Visualized brain and posterior fossa within normal limits. Craniocervical junction normal. Paraspinous and prevertebral soft tissues within normal limits. Normal flow voids seen within the vertebral arteries bilaterally.  Disc levels:  C2-C3: Small central disc protrusion minimally indents the ventral thecal sac. Superimposed mild uncovertebral and facet hypertrophy. No spinal stenosis. Foramina remain patent.  C3-C4: Mild disc bulge with uncovertebral hypertrophy. Superimposed left-sided facet degeneration. No significant spinal stenosis. Moderate left C4 foraminal narrowing. Right neural foramen remains patent.  C4-C5: Degenerative intervertebral disc space narrowing with diffuse disc osteophyte complex, eccentric to the right. Broad posterior component flattens and partially effaces the ventral thecal sac, eccentric to the right. Mild flattening of the ventral cord without cord signal changes. Mild spinal stenosis.  There is a superimposed more focal right foraminal to extraforaminal disc osteophyte complex with resultant severe right C5 foraminal stenosis (series 8, image 15). Moderate left C5 foraminal narrowing present as well.  C5-C6: Degenerative intervertebral disc space narrowing with diffuse disc osteophyte complex, asymmetric to the left. Flattening and partial effacement of the ventral thecal sac with mild cord flattening. Superimposed mild ligament flavum hypertrophy. Resultant mild-to-moderate spinal stenosis. Moderate to severe left worse than right C6 foraminal narrowing.  C6-C7: Degenerative intervertebral disc space narrowing with diffuse disc osteophyte complex. Superimposed mild facet and ligament flavum hypertrophy. Resultant mild spinal stenosis with moderate bilateral C7 foraminal narrowing.  C7-T1: Minimal disc bulge with facet hypertrophy. No spinal stenosis. Foramina remain patent.  Visualized upper thoracic spine demonstrates no significant finding.  IMPRESSION: 1. No acute abnormality within the cervical spine. No evidence for demyelinating disease, myelopathy, or acute infection. 2. Left eccentric disc osteophyte complex at C5-6 with resultant mild to moderate spinal stenosis, with moderate to severe left worse than right C6 foraminal narrowing. Findings could contribute to left upper extremity symptoms. 3. Right eccentric disc osteophyte complex at C4-5 with resultant mild canal with severe right C6 foraminal narrowing. 4. Additional moderate left C5 and bilateral C7 foraminal stenosis related to uncovertebral and facet disease.   Electronically Signed   By: Jeannine Boga M.D.   On: 10/09/2020 23:02    Treatments: Stroke workup  Discharge Exam: Blood pressure (!) 141/69, pulse 87, temperature 97.8 F (36.6 C), resp. rate (!) 22, height 5' (1.524 m), weight 65.8 kg, SpO2 98 %. General appearance: alert and cooperative Resp: clear to  auscultation bilaterally Cardio: regular rate and rhythm, S1, S2 normal, no murmur, click, rub or gallop GI: soft, non-tender; bowel sounds normal; no masses,  no organomegaly Extremities: extremities normal, atraumatic, no cyanosis or edema  Disposition: Discharge disposition: 01-Home or Self Care       Discharge Instructions    Diet - low sodium heart healthy   Complete by: As directed    Increase activity slowly   Complete by: As directed      Allergies as of 10/10/2020   No Known Allergies     Medication List    STOP taking these medications   naproxen 500 MG tablet Commonly known as: NAPROSYN     TAKE these medications   acetaminophen 325 MG tablet Commonly known as: TYLENOL Take 650 mg by mouth every 4 (four) hours as needed for mild pain or fever.   albuterol 108 (90 Base) MCG/ACT inhaler Commonly known as: VENTOLIN HFA Inhale 1-2 puffs into the lungs every 6 (six) hours as needed for wheezing or shortness of breath.   anastrozole 1 MG tablet Commonly known as: ARIMIDEX TAKE 1 TABLET BY MOUTH ONCE DAILY.   aspirin EC 81 MG tablet Take 81 mg by mouth daily after breakfast.   benazepril 20 MG tablet Commonly known as: LOTENSIN Take 20 mg by mouth daily after breakfast.   bismuth subsalicylate 654 YT/03TW suspension Commonly known as: PEPTO BISMOL Take 30 mLs by mouth every 2 (two) hours as needed (nausea/vomitting). (max 6 doses daily)   calcium-vitamin D 500-200 MG-UNIT Tabs tablet Commonly known as: OSCAL WITH D TAKE 2 TABLETS BY MOUTH IN THE MORNING WITH BREAKFAST. What changed: See the new instructions.   diphenhydrAMINE 25 MG tablet Commonly known as: BENADRYL Take 25 mg by mouth at bedtime as needed for allergies or sleep.   docusate sodium 100 MG capsule Commonly known as: COLACE Take 100 mg by mouth daily after breakfast.   donepezil 5 MG tablet Commonly known as: ARICEPT Take 5 mg by mouth at bedtime.   ferrous sulfate 325 (65 FE) MG  tablet Take 325 mg by mouth daily.   ibuprofen 600 MG tablet Commonly known as: ADVIL Take 600 mg by mouth every 6 (six) hours as needed for mild pain or moderate pain.   loperamide 2 MG tablet Commonly known as: IMODIUM A-D Take 2 mg by mouth 4 (four) times daily as needed for diarrhea or loose stools.   lovastatin 20 MG tablet Commonly known as: MEVACOR Take 20 mg by mouth daily.   magnesium hydroxide 400 MG/5ML suspension Commonly known as: MILK OF MAGNESIA Take 30 mLs by mouth daily as needed for mild constipation or moderate constipation.   melatonin 5 MG Tabs Take 5 mg by mouth at bedtime.   metFORMIN 500 MG tablet Commonly known as: GLUCOPHAGE Take 500 mg by mouth daily after breakfast.   Mintox 921-194-17 MG/5ML suspension Generic drug: alum & mag hydroxide-simeth Take 30 mLs by mouth daily as needed for indigestion or heartburn.   Mucus Relief DM 30-600 MG Tb12 Take 1 tablet by mouth daily as needed (cough/congestion).   multivitamin with minerals tablet Take 1 tablet by mouth daily.   omeprazole 20 MG capsule Commonly known as: PRILOSEC Take 20 mg by mouth 2 (two) times daily before a meal.   Phenylephrine-DM-GG 5-10-100 MG/5ML Liqd Take 10 mLs by mouth every 6 (six) hours as needed (cough).   sodium phosphate 7-19 GM/118ML Enem Place 1 enema rectally daily as needed for severe constipation.   Spiriva Respimat 2.5 MCG/ACT Aers Generic drug: Tiotropium Bromide Monohydrate Inhale 2 puffs into the lungs daily.       Follow-up Information    Meade Maw, MD Follow up in 2 week(s).   Specialty: Neurosurgery Contact information: [place] [address] [phone]               Signed: Sharen Hones 10/10/2020, 2:01 PM

## 2020-10-10 NOTE — Progress Notes (Signed)
*  PRELIMINARY RESULTS* Echocardiogram 2D Echocardiogram has been performed.  Michelle Henry 10/10/2020, 9:03 AM

## 2020-10-10 NOTE — ED Notes (Signed)
Pt to MRI

## 2020-10-10 NOTE — ED Notes (Signed)
Provided DC instructions. Verbalized understanding. Wheeled to lobby to wait for ride.

## 2020-10-10 NOTE — Discharge Instructions (Signed)
1.  Schedule appointment with neurosurgery in 2 weeks. 2.  Follow-up with your family doctor in 1 week.

## 2020-10-11 LAB — RPR: RPR Ser Ql: NONREACTIVE

## 2020-10-12 LAB — METHYLMALONIC ACID, SERUM: Methylmalonic Acid, Quantitative: 120 nmol/L (ref 0–378)

## 2020-10-22 LAB — VITAMIN B1: Vitamin B1 (Thiamine): 137.4 nmol/L (ref 66.5–200.0)

## 2020-11-09 ENCOUNTER — Other Ambulatory Visit: Payer: Self-pay

## 2020-11-09 ENCOUNTER — Emergency Department
Admission: EM | Admit: 2020-11-09 | Discharge: 2020-11-09 | Disposition: A | Discharge status: Home / Self Care | Payer: 59 | Attending: Emergency Medicine | Admitting: Emergency Medicine

## 2020-11-09 DIAGNOSIS — Z7984 Long term (current) use of oral hypoglycemic drugs: Secondary | ICD-10-CM | POA: Diagnosis not present

## 2020-11-09 DIAGNOSIS — F17210 Nicotine dependence, cigarettes, uncomplicated: Secondary | ICD-10-CM | POA: Diagnosis not present

## 2020-11-09 DIAGNOSIS — R197 Diarrhea, unspecified: Secondary | ICD-10-CM | POA: Diagnosis not present

## 2020-11-09 DIAGNOSIS — Z7982 Long term (current) use of aspirin: Secondary | ICD-10-CM | POA: Insufficient documentation

## 2020-11-09 DIAGNOSIS — Z79899 Other long term (current) drug therapy: Secondary | ICD-10-CM | POA: Insufficient documentation

## 2020-11-09 DIAGNOSIS — J449 Chronic obstructive pulmonary disease, unspecified: Secondary | ICD-10-CM | POA: Diagnosis not present

## 2020-11-09 DIAGNOSIS — Z853 Personal history of malignant neoplasm of breast: Secondary | ICD-10-CM | POA: Insufficient documentation

## 2020-11-09 DIAGNOSIS — I129 Hypertensive chronic kidney disease with stage 1 through stage 4 chronic kidney disease, or unspecified chronic kidney disease: Secondary | ICD-10-CM | POA: Diagnosis not present

## 2020-11-09 DIAGNOSIS — E1122 Type 2 diabetes mellitus with diabetic chronic kidney disease: Secondary | ICD-10-CM | POA: Insufficient documentation

## 2020-11-09 DIAGNOSIS — Z7951 Long term (current) use of inhaled steroids: Secondary | ICD-10-CM | POA: Diagnosis not present

## 2020-11-09 DIAGNOSIS — N189 Chronic kidney disease, unspecified: Secondary | ICD-10-CM | POA: Insufficient documentation

## 2020-11-09 DIAGNOSIS — R112 Nausea with vomiting, unspecified: Secondary | ICD-10-CM

## 2020-11-09 LAB — CBC WITH DIFFERENTIAL/PLATELET
Abs Immature Granulocytes: 0.02 10*3/uL (ref 0.00–0.07)
Basophils Absolute: 0 10*3/uL (ref 0.0–0.1)
Basophils Relative: 0 %
Eosinophils Absolute: 0 10*3/uL (ref 0.0–0.5)
Eosinophils Relative: 0 %
HCT: 37 % (ref 36.0–46.0)
Hemoglobin: 12.4 g/dL (ref 12.0–15.0)
Immature Granulocytes: 0 %
Lymphocytes Relative: 13 %
Lymphs Abs: 0.8 10*3/uL (ref 0.7–4.0)
MCH: 29.7 pg (ref 26.0–34.0)
MCHC: 33.5 g/dL (ref 30.0–36.0)
MCV: 88.5 fL (ref 80.0–100.0)
Monocytes Absolute: 0.6 10*3/uL (ref 0.1–1.0)
Monocytes Relative: 9 %
Neutro Abs: 4.9 10*3/uL (ref 1.7–7.7)
Neutrophils Relative %: 78 %
Platelets: 233 10*3/uL (ref 150–400)
RBC: 4.18 MIL/uL (ref 3.87–5.11)
RDW: 13 % (ref 11.5–15.5)
WBC: 6.3 10*3/uL (ref 4.0–10.5)
nRBC: 0 % (ref 0.0–0.2)

## 2020-11-09 LAB — COMPREHENSIVE METABOLIC PANEL
ALT: 21 U/L (ref 0–44)
AST: 25 U/L (ref 15–41)
Albumin: 3.9 g/dL (ref 3.5–5.0)
Alkaline Phosphatase: 68 U/L (ref 38–126)
Anion gap: 11 (ref 5–15)
BUN: 17 mg/dL (ref 8–23)
CO2: 27 mmol/L (ref 22–32)
Calcium: 9.7 mg/dL (ref 8.9–10.3)
Chloride: 98 mmol/L (ref 98–111)
Creatinine, Ser: 0.8 mg/dL (ref 0.44–1.00)
GFR, Estimated: >60 mL/min (ref >60)
Glucose, Bld: 148 mg/dL — ABNORMAL HIGH (ref 70–99)
Potassium: 3.8 mmol/L (ref 3.5–5.1)
Sodium: 136 mmol/L (ref 135–145)
Total Bilirubin: 0.5 mg/dL (ref 0.3–1.2)
Total Protein: 7.7 g/dL (ref 6.5–8.1)

## 2020-11-09 LAB — LIPASE, BLOOD: Lipase: 32 U/L (ref 11–51)

## 2020-11-09 LAB — LACTIC ACID, PLASMA: Lactic Acid, Venous: 1.8 mmol/L (ref 0.5–1.9)

## 2020-11-09 MED ORDER — ONDANSETRON 4 MG PO TBDP: 4.0000 mg | ORAL_TABLET | Freq: Three times a day (TID) | ORAL | 0 refills | Status: AC | PRN

## 2020-11-09 MED ORDER — ONDANSETRON HCL 4 MG/2ML IJ SOLN
4.0000 mg | Freq: Once | INTRAMUSCULAR | Status: AC
  Administered 2020-11-09: 4 mg via INTRAVENOUS
  Filled 2020-11-09: qty 2

## 2020-11-09 MED ORDER — SODIUM CHLORIDE 0.9 % IV BOLUS
1000.0000 mL | Freq: Once | INTRAVENOUS | Status: AC
  Administered 2020-11-09: 1000 mL via INTRAVENOUS

## 2020-11-09 NOTE — ED Provider Notes (Signed)
[facility] Emergency Department Provider Note  ____________________________________________  Time seen: Approximately 6:02 PM  I have reviewed the triage vital signs and the nursing notes.   HISTORY  Chief Complaint Emesis and Diarrhea    HPI Michelle Henry is a 75 y.o. female who presents the emergency department complaining of nausea, vomiting, diarrhea.  Patient reports that she started to have emesis last night.  This morning she started to have diarrhea.  She states that the emesis is slightly green-colored.  No blood identified in emesis or stool.  Patient has had no pain to include no chest pain, abdominal pain.  She denied any fever at home and states that she has taken Tylenol but unsure how [place] ago she had her Tylenol.  She does have a history of chronic kidney disease, COPD, diabetes, GERD, hypertension, small bowel obstruction         Past Medical History:  Diagnosis Date  . Blood transfusion   . Cancer ([place])   . Chronic kidney disease   . COPD (chronic obstructive pulmonary disease) ([place])   . Diabetes mellitus   . Dysphagia   . GERD (gastroesophageal reflux disease)   . High cholesterol   . Hypertension   . Shortness of breath     Patient Active Problem List   Diagnosis Date Noted  . Stroke-like episode 10/09/2020  . Diabetes mellitus type 2, noninsulin dependent ([place]) 10/09/2020  . Confusion 05/19/2020  . Memory change 05/19/2020  . Malignant neoplasm of lower-outer quadrant of left breast of female, estrogen receptor positive ([place]) 01/07/2017  . Breast cancer, left breast ([place]) 12/06/2016  . Breast cancer, left ([place]) 10/31/2016  . History of colonic polyps 03/20/2016  . COPD (chronic obstructive pulmonary disease) ([place]) 12/25/2012  . Stricture and stenosis of esophagus 12/25/2012  . Dysphagia 10/29/2011  . SBO (small bowel obstruction) ([place]) 03/01/2011  . HTN (hypertension) 03/01/2011  . DM2 (diabetes mellitus, type 2) ([place])  03/01/2011  . Hyperlipemia 03/01/2011  . Tobacco abuse 03/01/2011  . GERD (gastroesophageal reflux disease) 03/01/2011    Past Surgical History:  Procedure Laterality Date  . BREAST SURGERY    . COLONOSCOPY N/A 03/28/2017   Procedure: COLONOSCOPY;  Surgeon: Rogene Houston, MD;  Location: [facility] ENDO SUITE;  Service: Endoscopy;  Laterality: N/A;  1200 - moved to 9/5 @ 10:30  . ESOPHAGOGASTRODUODENOSCOPY (EGD) WITH ESOPHAGEAL DILATION  11/2012   [facility]  . MASTECTOMY  right side  . MASTECTOMY MODIFIED RADICAL Left 12/06/2016   Procedure: MASTECTOMY MODIFIED RADICAL;  Surgeon: [name], MD;  Location: [facility] ORS;  Service: General;  Laterality: Left;  . TONSILLECTOMY      Prior to Admission medications   Medication Sig Start Date End Date Taking? Authorizing Provider  ondansetron (ZOFRAN-ODT) 4 MG disintegrating tablet Take 1 tablet (4 mg total) by mouth every 8 (eight) hours as needed for nausea or vomiting. 11/09/20  Yes Pheng Prokop, Charline Bills, PA-C  acetaminophen (TYLENOL) 325 MG tablet Take 650 mg by mouth every 4 (four) hours as needed for mild pain or fever.    [provider]  albuterol (VENTOLIN HFA) 108 (90 Base) MCG/ACT inhaler Inhale 1-2 puffs into the lungs every 6 (six) hours as needed for wheezing or shortness of breath. 10/10/20   Sharen Hones, MD  alum & mag hydroxide-simeth ([place]) 200-200-20 MG/5ML suspension Take 30 mLs by mouth daily as needed for indigestion or heartburn.    [provider]  anastrozole (ARIMIDEX) 1 MG tablet TAKE 1  TABLET BY MOUTH ONCE DAILY. Patient taking differently: Take 1 mg by mouth daily. 02/26/19   Derek Jack, MD  aspirin EC 81 MG tablet Take 81 mg by mouth daily after breakfast.     [provider]  benazepril (LOTENSIN) 20 MG tablet Take 20 mg by mouth daily after breakfast.     [provider]  bismuth subsalicylate (PEPTO BISMOL) 262 MG/15ML suspension Take 30 mLs by mouth every 2 (two) hours as needed  (nausea/vomitting). (max 6 doses daily)    [provider]  calcium-vitamin D (OSCAL WITH D) 500-200 MG-UNIT TABS tablet TAKE 2 TABLETS BY MOUTH IN THE MORNING WITH BREAKFAST. Patient taking differently: Take 2 tablets by mouth daily. 07/20/19   Lockamy, Theresia Lo, NP-C  Dextromethorphan-guaiFENesin (MUCUS RELIEF DM) 30-600 MG TB12 Take 1 tablet by mouth daily as needed (cough/congestion).    [provider]  diphenhydrAMINE (BENADRYL) 25 MG tablet Take 25 mg by mouth at bedtime as needed for allergies or sleep.    [provider]  docusate sodium (COLACE) 100 MG capsule Take 100 mg by mouth daily after breakfast.    [provider]  donepezil (ARICEPT) 5 MG tablet Take 5 mg by mouth at bedtime. 09/13/20   [provider]  ferrous sulfate 325 (65 FE) MG tablet Take 325 mg by mouth daily.     [provider]  ibuprofen (ADVIL) 600 MG tablet Take 600 mg by mouth every 6 (six) hours as needed for mild pain or moderate pain.    [provider]  loperamide (IMODIUM A-D) 2 MG tablet Take 2 mg by mouth 4 (four) times daily as needed for diarrhea or loose stools.    [provider]  lovastatin (MEVACOR) 20 MG tablet Take 20 mg by mouth daily.    [provider]  magnesium hydroxide (MILK OF MAGNESIA) 400 MG/5ML suspension Take 30 mLs by mouth daily as needed for mild constipation or moderate constipation.    [provider]  melatonin 5 MG TABS Take 5 mg by mouth at bedtime.    [provider]  metFORMIN (GLUCOPHAGE) 500 MG tablet Take 500 mg by mouth daily after breakfast.  07/27/15   [provider]  Multiple Vitamins-Minerals (MULTIVITAMIN WITH MINERALS) tablet Take 1 tablet by mouth daily.    [provider]  omeprazole (PRILOSEC) 20 MG capsule Take 20 mg by mouth 2 (two) times daily before a meal.    [provider]  Phenylephrine-DM-GG 5-10-100 MG/5ML LIQD Take 10 mLs by mouth  every 6 (six) hours as needed (cough).    [provider]  sodium phosphate (FLEET) 7-19 GM/118ML ENEM Place 1 enema rectally daily as needed for severe constipation.    [provider]  SPIRIVA RESPIMAT 2.5 MCG/ACT AERS Inhale 2 puffs into the lungs daily.    [provider]    Allergies Patient has no known allergies.  Family History  Problem Relation Age of Onset  . Hypertension Mother   . Kidney disease Mother   . Emphysema Father   . Diabetes Brother   . COPD Brother   . Heart attack Brother     Social History Social History   Tobacco Use  . Smoking status: Current Every Day Smoker    Packs/day: 1.00    Years: 53.00    Pack years: 53.00    Types: Cigarettes  . Smokeless tobacco: Never Used  Vaping Use  . Vaping Use: Never used  Substance Use Topics  .  Alcohol use: No  . Drug use: No     Review of Systems  Constitutional: No fever/chills Eyes: No visual changes. No discharge ENT: No upper respiratory complaints. Cardiovascular: no chest pain. Respiratory: no cough. No SOB. Gastrointestinal: No abdominal pain.  Positive for nausea, vomiting, diarrhea Musculoskeletal: Negative for musculoskeletal pain. Skin: Negative for rash, abrasions, lacerations, ecchymosis. Neurological: Negative for headaches, focal weakness or numbness.  10 System ROS otherwise negative.  ____________________________________________   PHYSICAL EXAM:  VITAL SIGNS: ED Triage Vitals  Enc Vitals Group     BP 11/09/20 1800 (!) 136/58     Pulse Rate 11/09/20 1800 (!) 110     Resp 11/09/20 1800 (!) 25     Temp 11/09/20 1754 98.5 F (36.9 C)     Temp Source 11/09/20 1754 Oral     SpO2 11/09/20 1800 95 %     Weight 11/09/20 1756 145 lb (65.8 kg)     Height 11/09/20 1756 5' (1.524 m)     Head Circumference --      Peak Flow --      Pain Score 11/09/20 1756 0     Pain Loc --      Pain Edu? --      Excl. in [place]? --      Constitutional: Alert and  oriented. Well appearing and in no acute distress. Eyes: Conjunctivae are normal. PERRL. EOMI. Head: Atraumatic. ENT:      Ears:       Nose: No congestion/rhinnorhea.      Mouth/Throat: Mucous membranes are moist.  Neck: No stridor.    Cardiovascular: Normal rate, regular rhythm. Normal S1 and S2.  Good peripheral circulation. Respiratory: Normal respiratory effort without tachypnea or retractions. Lungs CTAB. Good air entry to the bases with no decreased or absent breath sounds. Gastrointestinal: Bowel sounds 4 quadrants. Soft and nontender to palpation in all 4 quadrants.. No guarding or rigidity. No palpable masses. No distention. No CVA tenderness. Musculoskeletal: Full range of motion to all extremities. No gross deformities appreciated. Neurologic:  Normal speech and language. No gross focal neurologic deficits are appreciated.  Skin:  Skin is warm, dry and intact. No rash noted. Psychiatric: Mood and affect are normal. Speech and behavior are normal. Patient exhibits appropriate insight and judgement.   ____________________________________________   LABS (all labs ordered are listed, but only abnormal results are displayed)  Labs Reviewed  COMPREHENSIVE METABOLIC PANEL - Abnormal; Notable for the following components:      Result Value   Glucose, Bld 148 (*)    All other components within normal limits  CULTURE, BLOOD (ROUTINE X 2)  CULTURE, BLOOD (ROUTINE X 2)  CBC WITH DIFFERENTIAL/PLATELET  LIPASE, BLOOD  LACTIC ACID, PLASMA  URINALYSIS, COMPLETE (UACMP) WITH MICROSCOPIC  LACTIC ACID, PLASMA   ____________________________________________  EKG   ____________________________________________  RADIOLOGY   No results found.  ____________________________________________    PROCEDURES  Procedure(s) performed:    Procedures    Medications  sodium chloride 0.9 % bolus 1,000 mL (1,000 mLs Intravenous New Bag/Given 11/09/20 1859)  ondansetron (ZOFRAN)  injection 4 mg (4 mg Intravenous Given 11/09/20 1858)     ____________________________________________   INITIAL IMPRESSION / ASSESSMENT AND PLAN / ED COURSE  Pertinent labs & imaging results that were available during my care of the patient were reviewed by me and considered in my medical decision making (see chart for details).  Review of the [place] CSRS was performed in accordance of the [place] prior to dispensing  any controlled drugs.           Patient's diagnosis is consistent with nausea, vomiting, diarrhea.  Patient presented to the emergency department from [facility] for complaint of nausea, vomiting and diarrhea.  Nausea and emesis started last night, diarrhea started this morning.  Nonbloody vomit and nonbloody diarrhea.  Patient had no abdominal pain and was nontender on exam.  Labs are reassuring at this time.  Patient reports significant improvement with Zofran and fluids.  This time I will prescribe patient antiemetic use for at home.  The patient develops other symptoms to include fevers, inability to keep foods or liquids down, abdominal pain, blood in the vomit or diarrhea she should return to be reevaluated..  Patient is given ED precautions to return to the ED for any worsening or new symptoms.     ____________________________________________  FINAL CLINICAL IMPRESSION(S) / ED DIAGNOSES  Final diagnoses:  Nausea vomiting and diarrhea      NEW MEDICATIONS STARTED DURING THIS VISIT:  ED Discharge Orders         Ordered    ondansetron (ZOFRAN-ODT) 4 MG disintegrating tablet  Every 8 hours PRN        11/09/20 1924              This chart was dictated using voice recognition software/Dragon. Despite best efforts to proofread, errors can occur which can change the meaning. Any change was purely unintentional.    Darletta Moll, PA-C 11/09/20 Emilio Math, MD 11/10/20 0111

## 2020-11-09 NOTE — ED Notes (Addendum)
Continuous cardiac and pulse ox monitoring. High fall precautions in place, limb alert to left arm.

## 2020-11-09 NOTE — ED Triage Notes (Signed)
Pt arrives from [facility] facility. Complains of nausea, vomiting, and diarrhea for two days.

## 2020-11-14 LAB — CULTURE, BLOOD (ROUTINE X 2)
Culture: NO GROWTH
Culture: NO GROWTH

## 2020-11-27 ENCOUNTER — Other Ambulatory Visit: Payer: Self-pay

## 2020-11-27 ENCOUNTER — Emergency Department: Payer: 59

## 2020-11-27 ENCOUNTER — Emergency Department
Admission: EM | Admit: 2020-11-27 | Discharge: 2020-11-27 | Disposition: A | Discharge status: Home / Self Care | Payer: 59 | Attending: Emergency Medicine | Admitting: Emergency Medicine

## 2020-11-27 DIAGNOSIS — I129 Hypertensive chronic kidney disease with stage 1 through stage 4 chronic kidney disease, or unspecified chronic kidney disease: Secondary | ICD-10-CM | POA: Insufficient documentation

## 2020-11-27 DIAGNOSIS — J449 Chronic obstructive pulmonary disease, unspecified: Secondary | ICD-10-CM | POA: Diagnosis not present

## 2020-11-27 DIAGNOSIS — Z853 Personal history of malignant neoplasm of breast: Secondary | ICD-10-CM | POA: Diagnosis not present

## 2020-11-27 DIAGNOSIS — Z79899 Other long term (current) drug therapy: Secondary | ICD-10-CM | POA: Diagnosis not present

## 2020-11-27 DIAGNOSIS — Z7984 Long term (current) use of oral hypoglycemic drugs: Secondary | ICD-10-CM | POA: Diagnosis not present

## 2020-11-27 DIAGNOSIS — E1122 Type 2 diabetes mellitus with diabetic chronic kidney disease: Secondary | ICD-10-CM | POA: Insufficient documentation

## 2020-11-27 DIAGNOSIS — W01198A Fall on same level from slipping, tripping and stumbling with subsequent striking against other object, initial encounter: Secondary | ICD-10-CM | POA: Insufficient documentation

## 2020-11-27 DIAGNOSIS — M25562 Pain in left knee: Secondary | ICD-10-CM | POA: Insufficient documentation

## 2020-11-27 DIAGNOSIS — R55 Syncope and collapse: Secondary | ICD-10-CM | POA: Diagnosis not present

## 2020-11-27 DIAGNOSIS — S0990XA Unspecified injury of head, initial encounter: Secondary | ICD-10-CM | POA: Insufficient documentation

## 2020-11-27 DIAGNOSIS — Z7982 Long term (current) use of aspirin: Secondary | ICD-10-CM | POA: Insufficient documentation

## 2020-11-27 DIAGNOSIS — N189 Chronic kidney disease, unspecified: Secondary | ICD-10-CM | POA: Diagnosis not present

## 2020-11-27 DIAGNOSIS — Z8673 Personal history of transient ischemic attack (TIA), and cerebral infarction without residual deficits: Secondary | ICD-10-CM | POA: Insufficient documentation

## 2020-11-27 DIAGNOSIS — Y92129 Unspecified place in nursing home as the place of occurrence of the external cause: Secondary | ICD-10-CM | POA: Diagnosis not present

## 2020-11-27 DIAGNOSIS — F17210 Nicotine dependence, cigarettes, uncomplicated: Secondary | ICD-10-CM | POA: Diagnosis not present

## 2020-11-27 LAB — URINALYSIS, COMPLETE (UACMP) WITH MICROSCOPIC
Bacteria, UA: NONE SEEN
Bilirubin Urine: NEGATIVE
Glucose, UA: NEGATIVE mg/dL
Hgb urine dipstick: NEGATIVE
Ketones, ur: NEGATIVE mg/dL
Leukocytes,Ua: NEGATIVE
Nitrite: NEGATIVE
Protein, ur: NEGATIVE mg/dL
Specific Gravity, Urine: 1.006 (ref 1.005–1.030)
pH: 6 (ref 5.0–8.0)

## 2020-11-27 LAB — BASIC METABOLIC PANEL
Anion gap: 11 (ref 5–15)
BUN: 13 mg/dL (ref 8–23)
CO2: 24 mmol/L (ref 22–32)
Calcium: 9.9 mg/dL (ref 8.9–10.3)
Chloride: 103 mmol/L (ref 98–111)
Creatinine, Ser: 0.98 mg/dL (ref 0.44–1.00)
GFR, Estimated: >60 mL/min (ref >60)
Glucose, Bld: 155 mg/dL — ABNORMAL HIGH (ref 70–99)
Potassium: 4.2 mmol/L (ref 3.5–5.1)
Sodium: 138 mmol/L (ref 135–145)

## 2020-11-27 LAB — CBC WITH DIFFERENTIAL/PLATELET
Abs Immature Granulocytes: 0.02 10*3/uL (ref 0.00–0.07)
Basophils Absolute: 0 10*3/uL (ref 0.0–0.1)
Basophils Relative: 1 %
Eosinophils Absolute: 0.2 10*3/uL (ref 0.0–0.5)
Eosinophils Relative: 3 %
HCT: 32.6 % — ABNORMAL LOW (ref 36.0–46.0)
Hemoglobin: 10.4 g/dL — ABNORMAL LOW (ref 12.0–15.0)
Immature Granulocytes: 0 %
Lymphocytes Relative: 32 %
Lymphs Abs: 1.8 10*3/uL (ref 0.7–4.0)
MCH: 29.5 pg (ref 26.0–34.0)
MCHC: 31.9 g/dL (ref 30.0–36.0)
MCV: 92.6 fL (ref 80.0–100.0)
Monocytes Absolute: 0.4 10*3/uL (ref 0.1–1.0)
Monocytes Relative: 7 %
Neutro Abs: 3.2 10*3/uL (ref 1.7–7.7)
Neutrophils Relative %: 57 %
Platelets: 281 10*3/uL (ref 150–400)
RBC: 3.52 MIL/uL — ABNORMAL LOW (ref 3.87–5.11)
RDW: 13.2 % (ref 11.5–15.5)
WBC: 5.6 10*3/uL (ref 4.0–10.5)
nRBC: 0 % (ref 0.0–0.2)

## 2020-11-27 LAB — TROPONIN I (HIGH SENSITIVITY): Troponin I (High Sensitivity): 3 ng/L (ref <18)

## 2020-11-27 IMAGING — DX DG KNEE 1-2V*L*
2 series · 2 of 2 positions shown · non-contrast
Comparison: None.

CLINICAL DATA: Left knee pain after fall

EXAM:
LEFT KNEE - 1-2 VIEW

[knee ap]
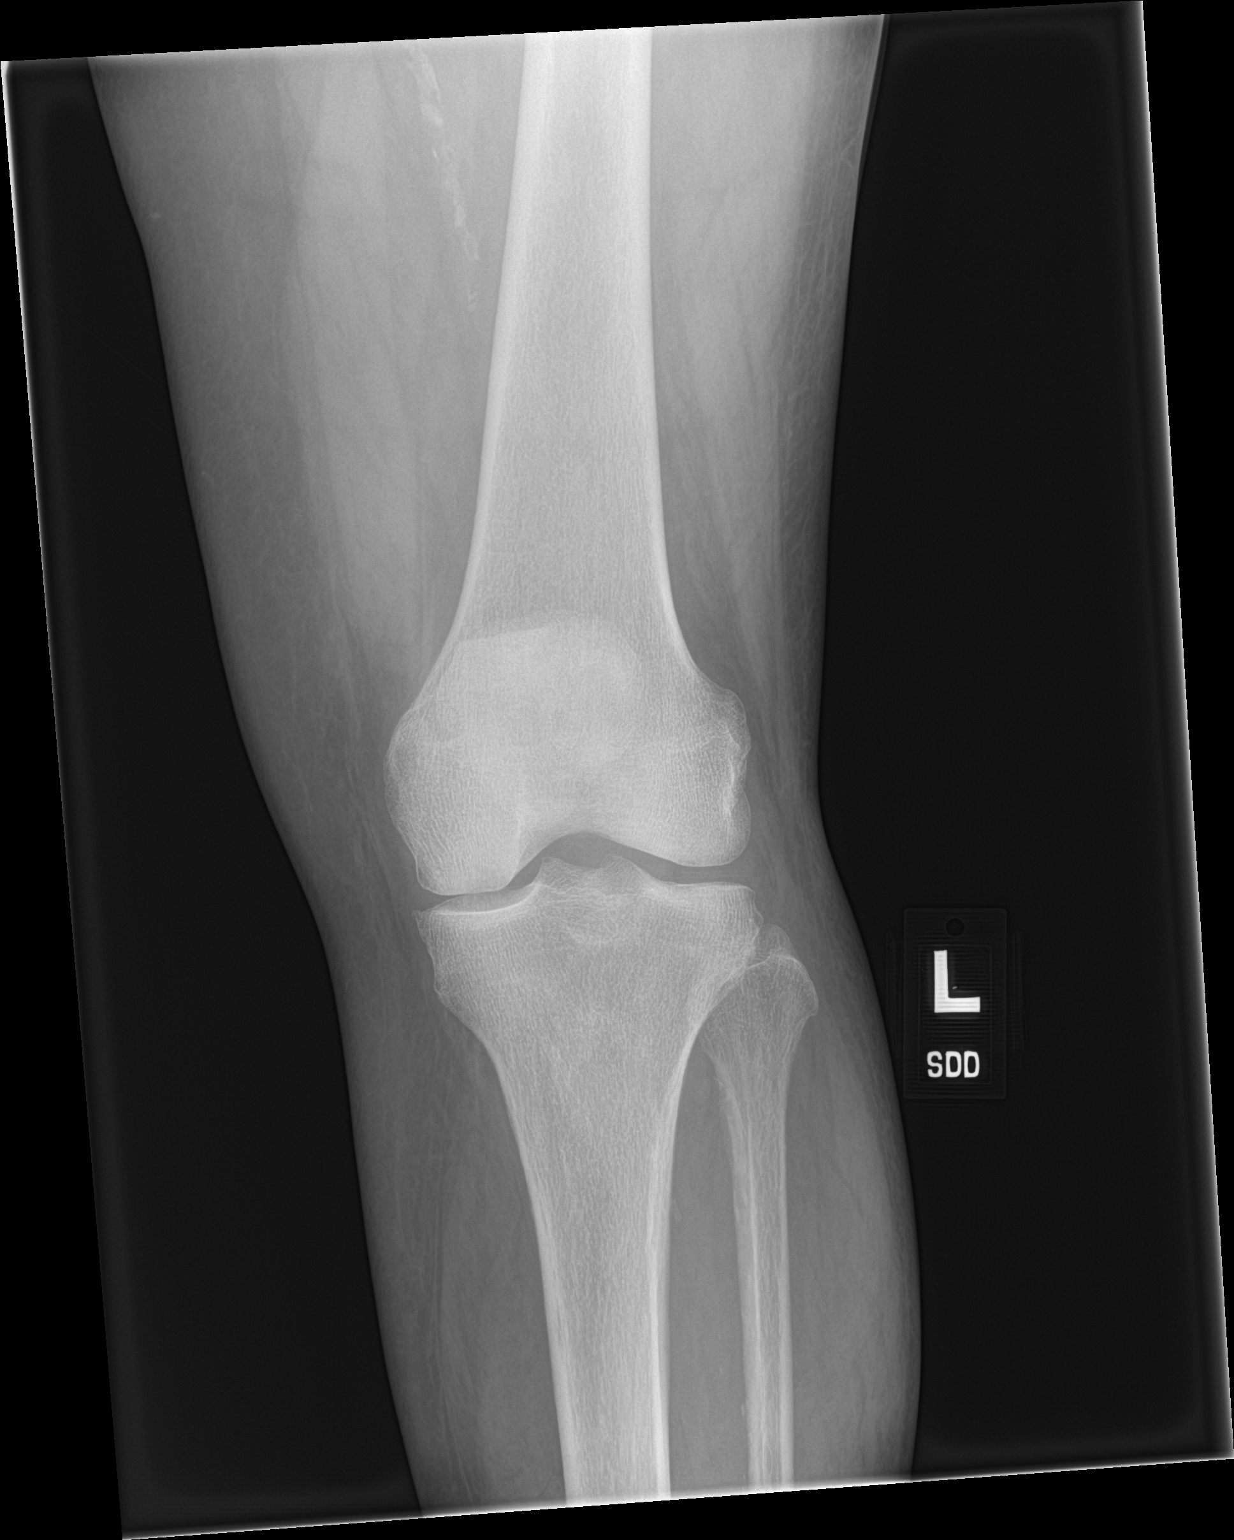

[knee lat]
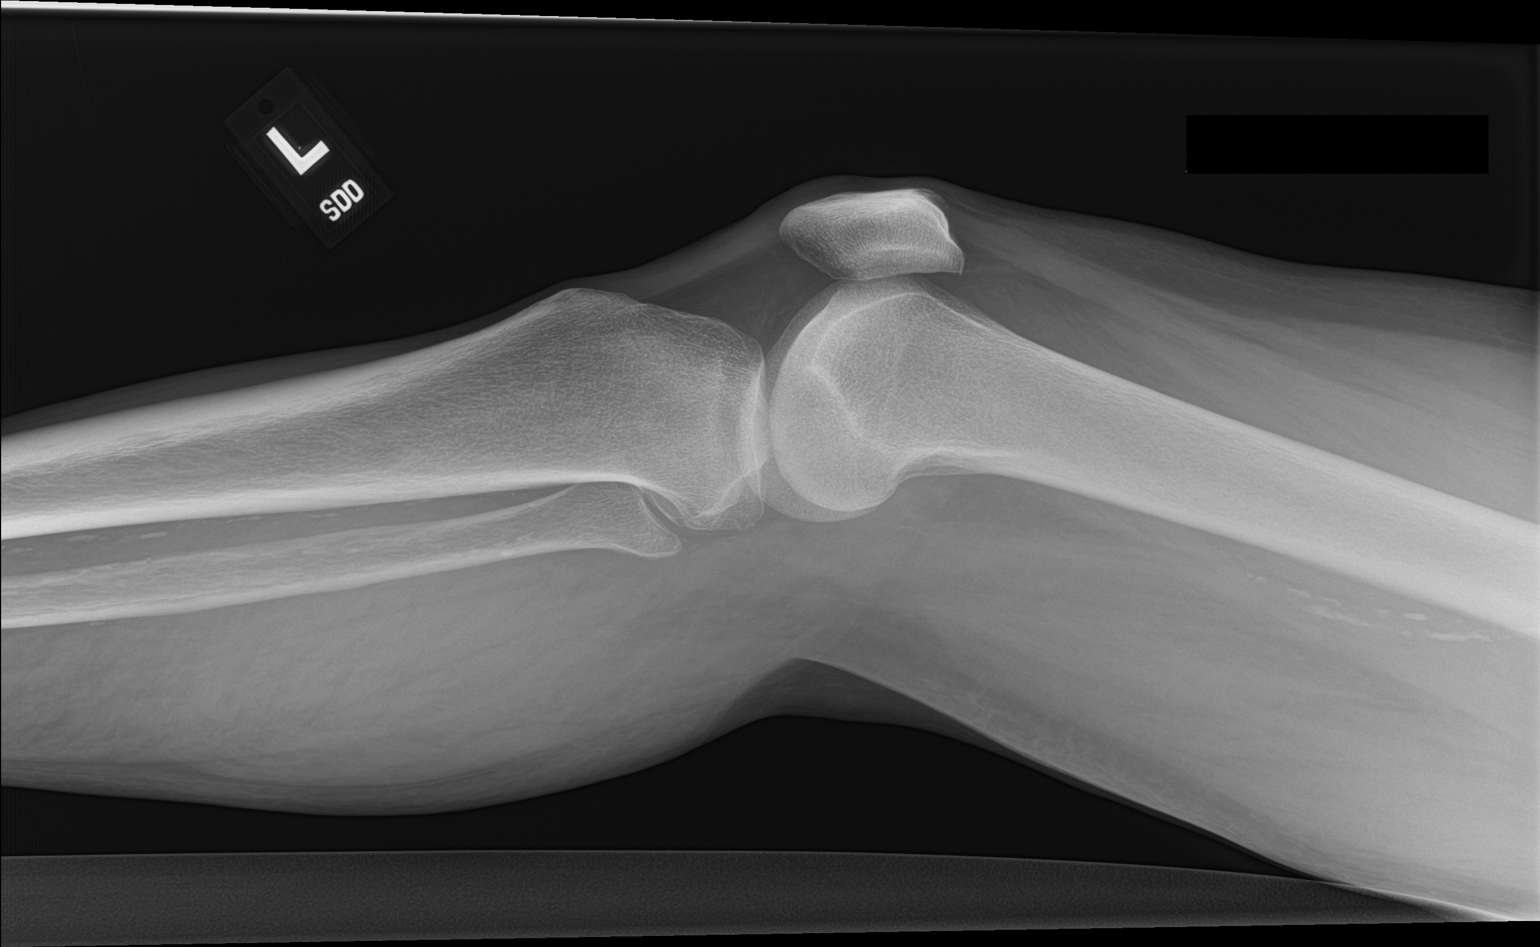

[2 of 2 positions shown; findings below may reference images not displayed]

FINDINGS: Frontal and lateral views of the left knee are obtained. No
fracture, subluxation, or dislocation. Mild medial and
patellofemoral compartmental osteoarthritis. No joint effusion. Soft
tissues are unremarkable.
IMPRESSION: 1. Mild osteoarthritis.  No acute fracture.

## 2020-11-27 IMAGING — CT CT HEAD W/O CM
3 series · 16 of 47 positions shown, 19 images · non-contrast
Comparison: [DATE]

CLINICAL DATA: Unwitnessed fall, swelling above left eye, headache

EXAM:
CT HEAD WITHOUT CONTRAST
TECHNIQUE: Contiguous axial images were obtained from the base of the skull
through the vertex without intravenous contrast.

[Series 2: head wo · axial · 0.42mm/px · z∈[+376,+511]mm · 10 of 33 slices shown, 13 images]
[im 3/33  brain]
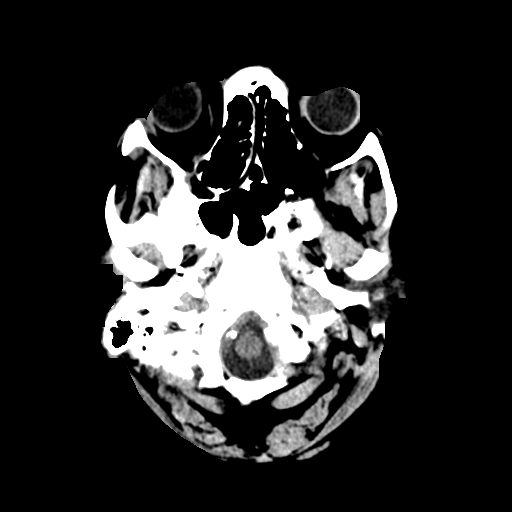
[im 3/33  bone]
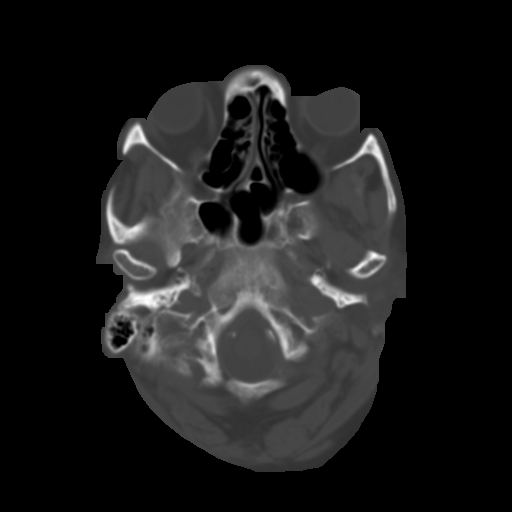
[im 6/33  brain]
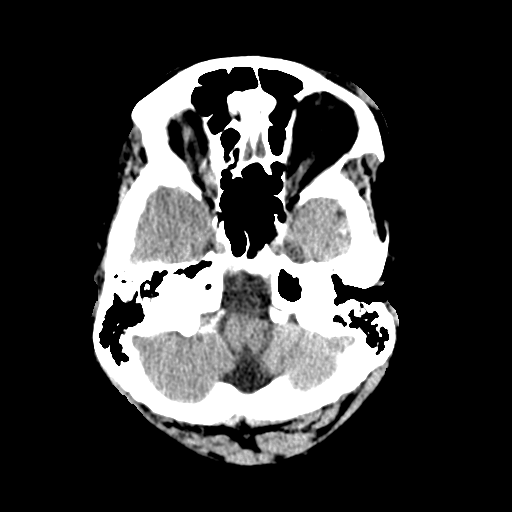
[im 9/33  brain]
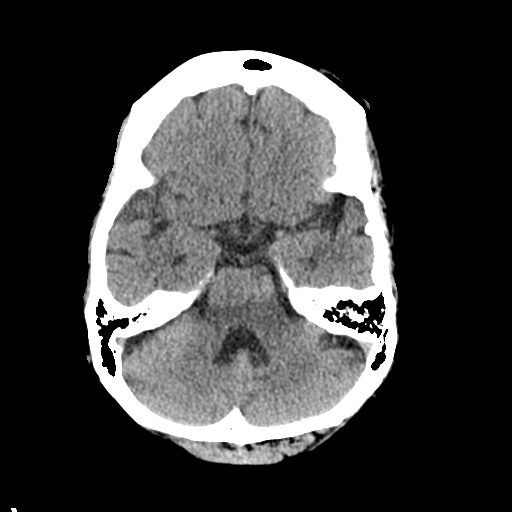
[im 12/33  brain]
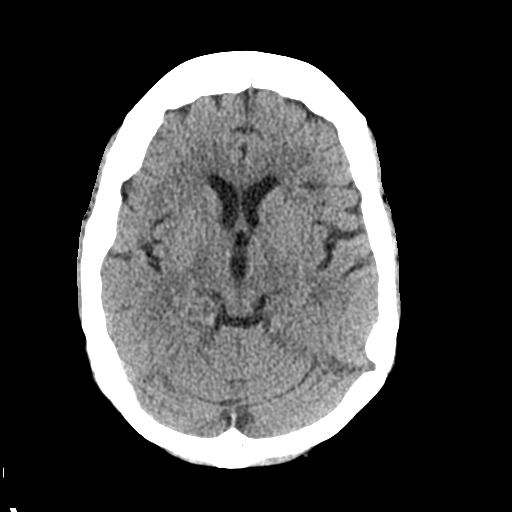
[im 15/33  brain]
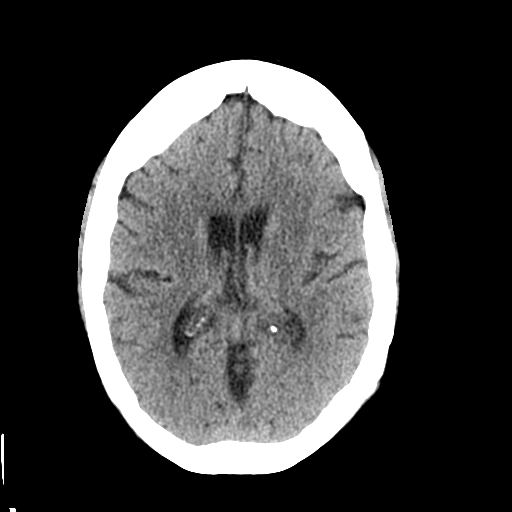
[im 15/33  bone]
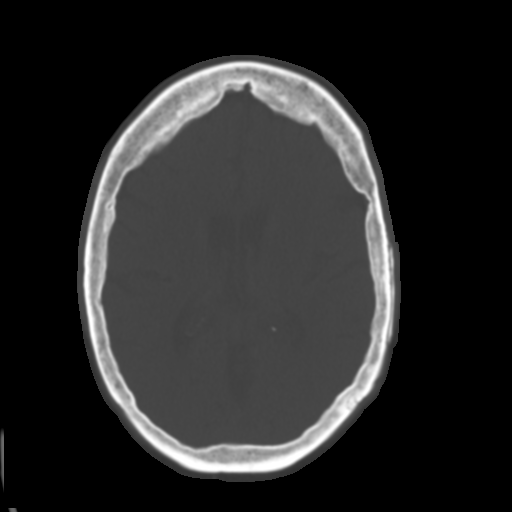
[im 18/33  brain]
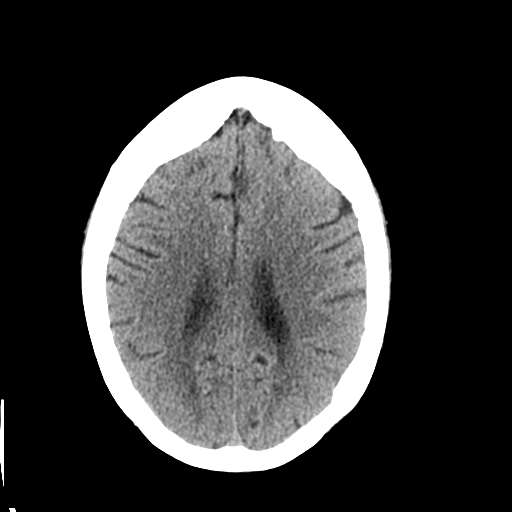
[im 21/33  brain]
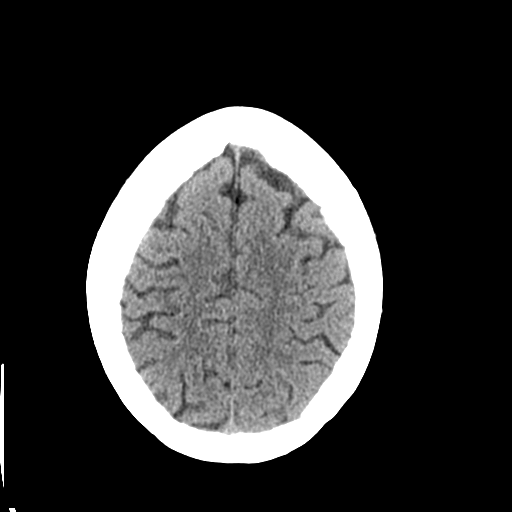
[im 25/33  brain]
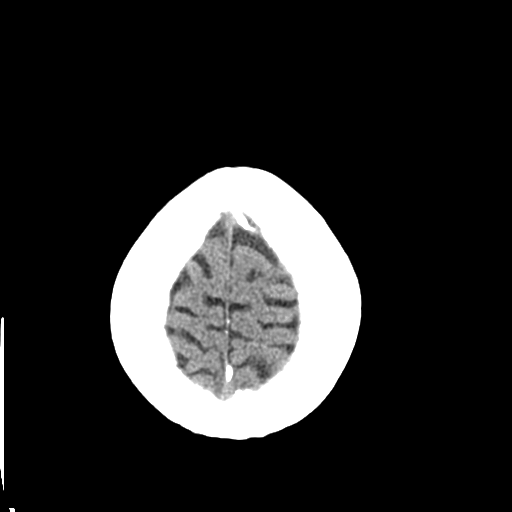
[im 27/33  brain]
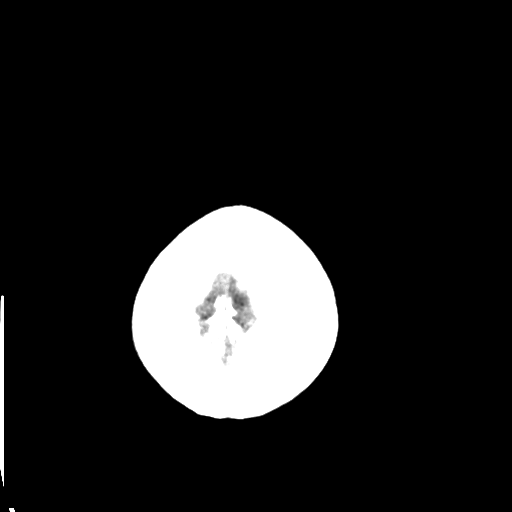
[im 27/33  bone]
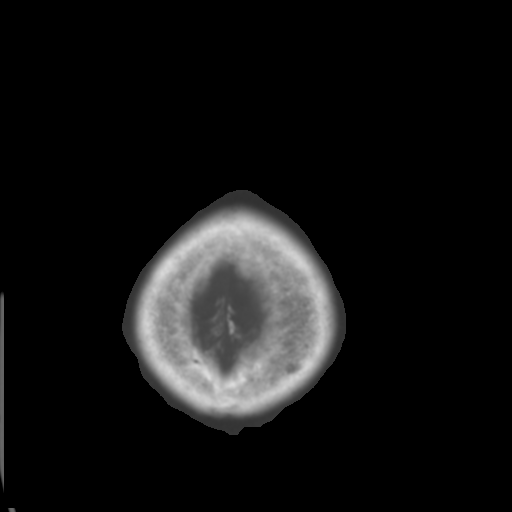
[im 30/33  brain]
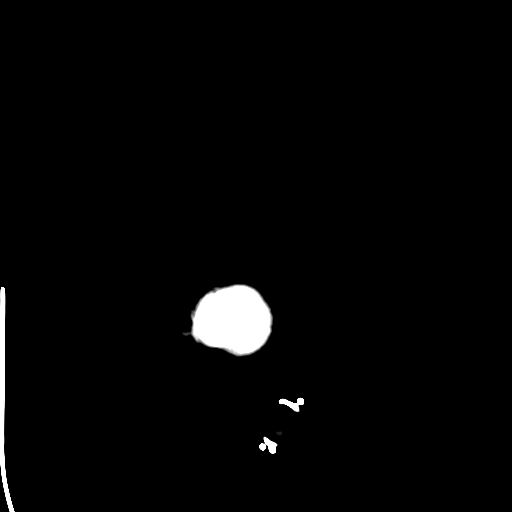

[Series 4: coronal soft tissue · coronal · 0.32mm/px · 3 of 68 slices shown]
[im 23/68  brain]
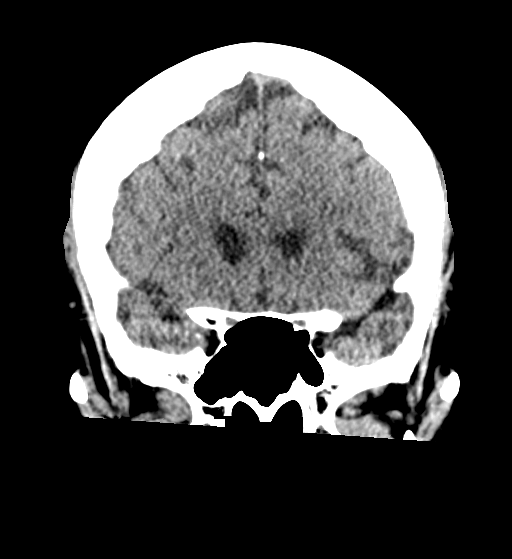
[im 30/68  brain]
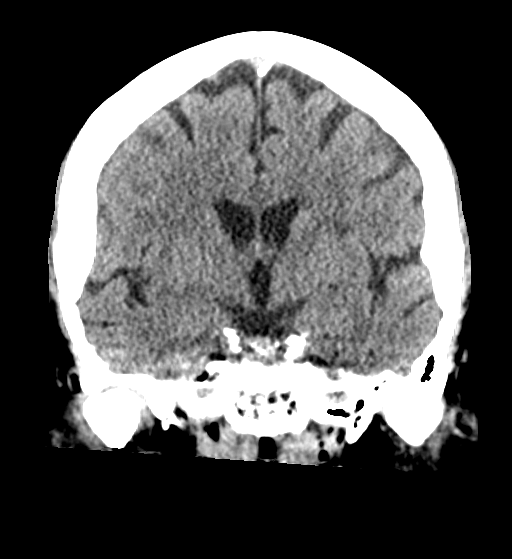
[im 38/68  brain]
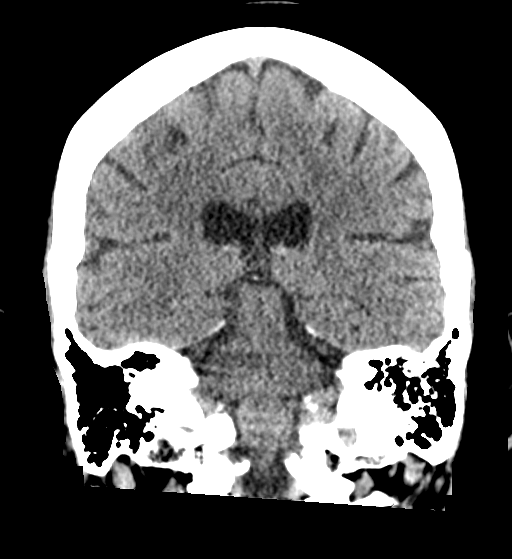

[Series 5: sagittal soft tissue · sagittal · 0.35mm/px · 3 of 57 slices shown]
[im 19/57  brain]
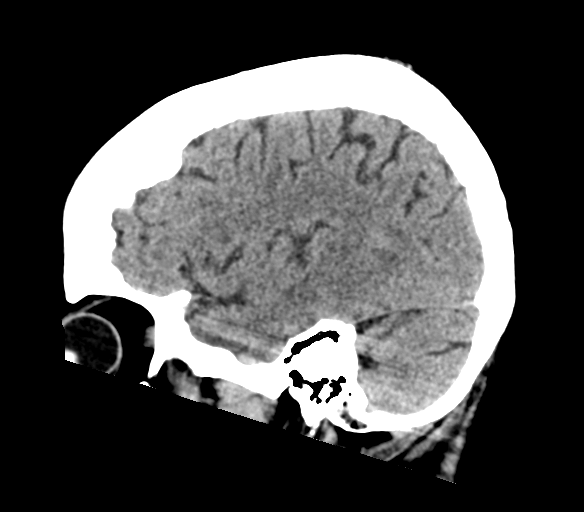
[im 29/57  brain]
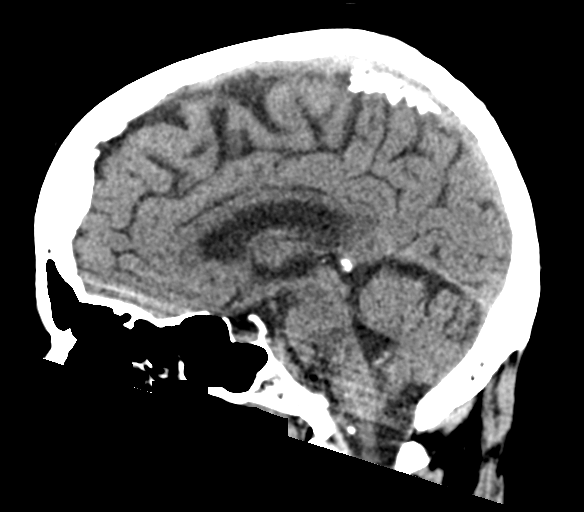
[im 38/57  brain]
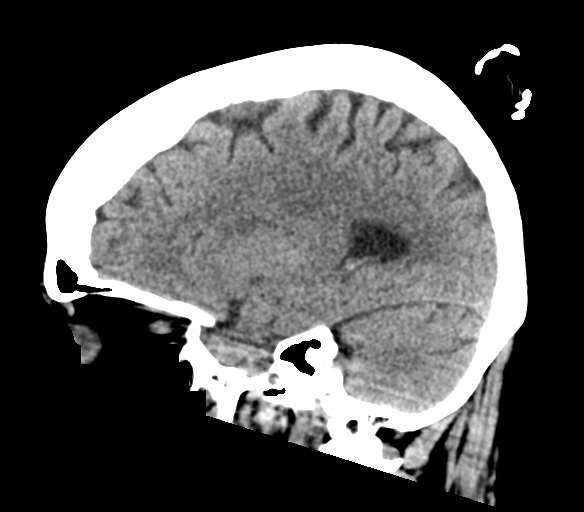

[16 of 47 positions shown; findings below may reference images not displayed]

FINDINGS: Brain: No evidence of acute infarction, hemorrhage, hydrocephalus,
extra-axial collection or mass lesion/mass effect.

Vascular: No hyperdense vessel or unexpected calcification.

Skull: Normal. Negative for fracture or focal lesion.

Sinuses/Orbits: No acute finding.

Other: Soft tissue hematoma overlying the RETINELLA (series 2, image
6)
IMPRESSION: 1. No acute intracranial pathology.
2. Soft tissue hematoma overlying the RETINELLA.

## 2020-11-27 MED ORDER — OXYCODONE-ACETAMINOPHEN 5-325 MG PO TABS
1.0000 | ORAL_TABLET | Freq: Once | ORAL | Status: AC
  Administered 2020-11-27: 1 via ORAL
  Filled 2020-11-27: qty 1

## 2020-11-27 MED ORDER — SODIUM CHLORIDE 0.9 % IV BOLUS
500.0000 mL | Freq: Once | INTRAVENOUS | Status: AC
  Administered 2020-11-27: 500 mL via INTRAVENOUS

## 2020-11-27 NOTE — ED Provider Notes (Signed)
-----------------------------------------   9:45 PM on 11/27/2020 -----------------------------------------  Blood pressure (!) 121/53, pulse 90, temperature 98.4 F (36.9 C), resp. rate 18, height 5' (1.524 m), weight 66 kg, SpO2 98 %.  Assuming care from Dr. Cherylann Banas.  In short, Michelle Henry is a 75 y.o. female with a chief complaint of Fall .  Refer to the original H&P for additional details.  The current plan of care is to await urinalysis.  Patient presented to the emergency department [facility] after she fell from a standing height.  She was going to the restroom, felt lightheaded and dizzy and fell.  She did hit her head.  Work-up to this point was reassuring.  Patient remained neurologically intact, reassuring head CT.  We were awaiting urinalysis to ensure no UTI causing her dizziness/weakness prior to the fall.  This returns with reassuring results with no evidence of UTI.  Patient is stable for discharge at this time..   ED diagnosis:  Minor head injury Near syncope    Brynda Peon 11/27/20 2221    Arta Silence, MD 11/28/20 [phone]

## 2020-11-27 NOTE — ED Provider Notes (Signed)
[facility] Emergency Department Provider Note ____________________________________________   Event Date/Time   First MD Initiated Contact with Patient 11/27/20 Bosie Helper     (approximate)  I have reviewed the triage vital signs and the nursing notes.   HISTORY  Chief Complaint Fall    HPI Michelle Henry is a 75 y.o. female with PMH as noted below including memory loss, COPD, diabetes, chronic kidney disease, and hypertension who presents from her assisted living facility after a fall from standing height.  The patient states that she was going to the bathroom and felt somewhat dizzy and lightheaded.  She fell but did not lose consciousness.  She hit the left side of her forehead on the ground.  She also hurt her left knee.  She denies neck or back pain.  She states that she was feeling a bit lightheaded earlier in the day.  Past Medical History:  Diagnosis Date  . Blood transfusion   . Cancer ([place])   . Chronic kidney disease   . COPD (chronic obstructive pulmonary disease) ([place])   . Diabetes mellitus   . Dysphagia   . GERD (gastroesophageal reflux disease)   . High cholesterol   . Hypertension   . Shortness of breath     Patient Active Problem List   Diagnosis Date Noted  . Stroke-like episode 10/09/2020  . Diabetes mellitus type 2, noninsulin dependent ([place]) 10/09/2020  . Confusion 05/19/2020  . Memory change 05/19/2020  . Malignant neoplasm of lower-outer quadrant of left breast of female, estrogen receptor positive ([place]) 01/07/2017  . Breast cancer, left breast ([place]) 12/06/2016  . Breast cancer, left ([place]) 10/31/2016  . History of colonic polyps 03/20/2016  . COPD (chronic obstructive pulmonary disease) ([place]) 12/25/2012  . Stricture and stenosis of esophagus 12/25/2012  . Dysphagia 10/29/2011  . SBO (small bowel obstruction) ([place]) 03/01/2011  . HTN (hypertension) 03/01/2011  . DM2 (diabetes mellitus, type 2) ([place]) 03/01/2011  . Hyperlipemia  03/01/2011  . Tobacco abuse 03/01/2011  . GERD (gastroesophageal reflux disease) 03/01/2011    Past Surgical History:  Procedure Laterality Date  . BREAST SURGERY    . COLONOSCOPY N/A 03/28/2017   Procedure: COLONOSCOPY;  Surgeon: Rogene Houston, MD;  Location: [facility];  Service: Endoscopy;  Laterality: N/A;  1200 - moved to 9/5 @ 10:30  . ESOPHAGOGASTRODUODENOSCOPY (EGD) WITH ESOPHAGEAL DILATION  11/2012   [facility]  . MASTECTOMY  right side  . MASTECTOMY MODIFIED RADICAL Left 12/06/2016   Procedure: MASTECTOMY MODIFIED RADICAL;  Surgeon: [name], MD;  Location: [facility] ORS;  Service: General;  Laterality: Left;  . TONSILLECTOMY      Prior to Admission medications   Medication Sig Start Date End Date Taking? Authorizing Provider  acetaminophen (TYLENOL) 325 MG tablet Take 650 mg by mouth every 4 (four) hours as needed for mild pain or fever.    [provider]  albuterol (VENTOLIN HFA) 108 (90 Base) MCG/ACT inhaler Inhale 1-2 puffs into the lungs every 6 (six) hours as needed for wheezing or shortness of breath. 10/10/20   Sharen Hones, MD  alum & mag hydroxide-simeth (Fernando Salinas) 200-200-20 MG/5ML suspension Take 30 mLs by mouth daily as needed for indigestion or heartburn.    [provider]  anastrozole (ARIMIDEX) 1 MG tablet TAKE 1 TABLET BY MOUTH ONCE DAILY. Patient taking differently: Take 1 mg by mouth daily. 02/26/19   Derek Jack, MD  aspirin EC 81 MG tablet Take 81 mg by mouth daily after breakfast.  [provider]  benazepril (LOTENSIN) 20 MG tablet Take 20 mg by mouth daily after breakfast.     [provider]  bismuth subsalicylate (PEPTO BISMOL) 262 MG/15ML suspension Take 30 mLs by mouth every 2 (two) hours as needed (nausea/vomitting). (max 6 doses daily)    [provider]  calcium-vitamin D (OSCAL WITH D) 500-200 MG-UNIT TABS tablet TAKE 2 TABLETS BY MOUTH IN THE MORNING WITH BREAKFAST. Patient taking differently:  Take 2 tablets by mouth daily. 07/20/19   Lockamy, Theresia Lo, NP-C  Dextromethorphan-guaiFENesin (MUCUS RELIEF DM) 30-600 MG TB12 Take 1 tablet by mouth daily as needed (cough/congestion).    [provider]  diphenhydrAMINE (BENADRYL) 25 MG tablet Take 25 mg by mouth at bedtime as needed for allergies or sleep.    [provider]  docusate sodium (COLACE) 100 MG capsule Take 100 mg by mouth daily after breakfast.    [provider]  donepezil (ARICEPT) 5 MG tablet Take 5 mg by mouth at bedtime. 09/13/20   [provider]  ferrous sulfate 325 (65 FE) MG tablet Take 325 mg by mouth daily.     [provider]  ibuprofen (ADVIL) 600 MG tablet Take 600 mg by mouth every 6 (six) hours as needed for mild pain or moderate pain.    [provider]  loperamide (IMODIUM A-D) 2 MG tablet Take 2 mg by mouth 4 (four) times daily as needed for diarrhea or loose stools.    [provider]  lovastatin (MEVACOR) 20 MG tablet Take 20 mg by mouth daily.    [provider]  magnesium hydroxide (MILK OF MAGNESIA) 400 MG/5ML suspension Take 30 mLs by mouth daily as needed for mild constipation or moderate constipation.    [provider]  melatonin 5 MG TABS Take 5 mg by mouth at bedtime.    [provider]  metFORMIN (GLUCOPHAGE) 500 MG tablet Take 500 mg by mouth daily after breakfast.  07/27/15   [provider]  Multiple Vitamins-Minerals (MULTIVITAMIN WITH MINERALS) tablet Take 1 tablet by mouth daily.    [provider]  omeprazole (PRILOSEC) 20 MG capsule Take 20 mg by mouth 2 (two) times daily before a meal.    [provider]  ondansetron (ZOFRAN-ODT) 4 MG disintegrating tablet Take 1 tablet (4 mg total) by mouth every 8 (eight) hours as needed for nausea or vomiting. 11/09/20   Cuthriell, Charline Bills, PA-C  Phenylephrine-DM-GG 5-10-100 MG/5ML LIQD Take 10 mLs by mouth every 6 (six) hours as needed  (cough).    [provider]  sodium phosphate (FLEET) 7-19 GM/118ML ENEM Place 1 enema rectally daily as needed for severe constipation.    [provider]  SPIRIVA RESPIMAT 2.5 MCG/ACT AERS Inhale 2 puffs into the lungs daily.    [provider]    Allergies Patient has no known allergies.  Family History  Problem Relation Age of Onset  . Hypertension Mother   . Kidney disease Mother   . Emphysema Father   . Diabetes Brother   . COPD Brother   . Heart attack Brother     Social History Social History   Tobacco Use  . Smoking status: Current Every Day Smoker    Packs/day: 1.00    Years: 53.00    Pack years: 53.00    Types: Cigarettes  . Smokeless tobacco: Never Used  Vaping Use  . Vaping Use: Never used  Substance Use Topics  . Alcohol use: No  .  Drug use: No    Review of Systems  Constitutional: No fever/chills. Eyes: No redness.. ENT: No sore throat. Cardiovascular: Denies chest pain. Respiratory: Denies shortness of breath. Gastrointestinal: No vomiting or diarrhea.  Genitourinary: Negative for dysuria.  Musculoskeletal: Negative for back pain. Skin: Negative for rash. Neurological: Negative for headaches, focal weakness or numbness.   ____________________________________________   PHYSICAL EXAM:  VITAL SIGNS: ED Triage Vitals  Enc Vitals Group     BP 11/27/20 1832 (!) 138/54     Pulse Rate 11/27/20 1832 91     Resp 11/27/20 1832 18     Temp 11/27/20 1832 98.4 F (36.9 C)     Temp src --      SpO2 11/27/20 1832 98 %     Weight 11/27/20 1830 145 lb 8.1 oz (66 kg)     Height 11/27/20 1830 5' (1.524 m)     Head Circumference --      Peak Flow --      Pain Score 11/27/20 1833 10     Pain Loc --      Pain Edu? --      Excl. in [place]? --     Constitutional: Alert and oriented x4.  Comfortable appearing and in no acute distress. Eyes: Conjunctivae are normal.  EOMI.  PERRLA. Head: Left eyebrow area mild swelling. Nose: No  congestion/rhinnorhea. Mouth/Throat: Mucous membranes are moist.   Neck: Normal range of motion.  No midline cervical spinal tenderness. Cardiovascular: Normal rate, regular rhythm. Grossly normal heart sounds.  Good peripheral circulation. Respiratory: Normal respiratory effort.  No retractions. Lungs CTAB. Gastrointestinal: Soft and nontender. No distention.  Genitourinary: No flank tenderness. Musculoskeletal: No lower extremity edema.  Extremities warm and well perfused.  No midline spinal tenderness.  Left knee with mild tenderness, full range of motion. Neurologic:  Normal speech and language.  Motor and sensory intact in all extremities.  Normal coordination with no ataxia.  No facial droop.   Skin:  Skin is warm and dry. No rash noted. Psychiatric: Mood and affect are normal. Speech and behavior are normal.  ____________________________________________   LABS (all labs ordered are listed, but only abnormal results are displayed)  Labs Reviewed  BASIC METABOLIC PANEL - Abnormal; Notable for the following components:      Result Value   Glucose, Bld 155 (*)    All other components within normal limits  CBC WITH DIFFERENTIAL/PLATELET - Abnormal; Notable for the following components:   RBC 3.52 (*)    Hemoglobin 10.4 (*)    HCT 32.6 (*)    All other components within normal limits  URINALYSIS, COMPLETE (UACMP) WITH MICROSCOPIC  TROPONIN I (HIGH SENSITIVITY)   ____________________________________________  EKG  ED ECG REPORT I, Arta Silence, the attending physician, personally viewed and interpreted this ECG.  Date: 11/27/2020 EKG Time: 1918 Rate: 84 Rhythm: normal sinus rhythm QRS Axis: normal Intervals: normal ST/T Wave abnormalities: normal Narrative Interpretation: no evidence of acute ischemia  ____________________________________________  RADIOLOGY  XR L knee: No acute fracture CT head: No ICH or other acute  abnormality  ____________________________________________   PROCEDURES  Procedure(s) performed: No  Procedures  Critical Care performed: No ____________________________________________   INITIAL IMPRESSION / ASSESSMENT AND PLAN / ED COURSE  Pertinent labs & imaging results that were available during my care of the patient were reviewed by me and considered in my medical decision making (see chart for details).  75 year old female with PMH as noted above including memory loss (although the patient  is alert and oriented x4) COPD, DM, CKD, hypertension who presents after a fall from standing height.  The patient reports a prodrome of lightheadedness but did not lose consciousness.  She states that she felt somewhat dizzy earlier today.  In addition to hitting her head, she reports some left knee pain but denies other injuries.  On exam, the patient is overall well-appearing.  Her vital signs are normal.  Physical exam is remarkable for mild swelling to the left eyebrow area and mild tenderness to the left knee.  Neurologic exam is normal.  We will obtain a CT to rule out ICH given the patient's age as well as x-rays of the left knee.  Differential for the dizziness/near syncope is broad but includes dehydration/hypovolemia, other metabolic abnormality, UTI, or less likely cardiac cause.  We will obtain basic labs, troponin, urinalysis, give a fluid bolus and reassess.  ----------------------------------------- 8:04 PM on 11/27/2020 -----------------------------------------  Imaging and labs are unremarkable.  Urinalysis is pending.  I signed the patient out to MD Corky Downs and [facility] for disposition.  ____________________________________________   FINAL CLINICAL IMPRESSION(S) / ED DIAGNOSES  Final diagnoses:  Minor head injury, initial encounter  Near syncope      NEW MEDICATIONS STARTED DURING THIS VISIT:  New Prescriptions   No medications on file     Note:   This document was prepared using Dragon voice recognition software and may include unintentional dictation errors.    Arta Silence, MD 11/27/20 2004

## 2020-11-27 NOTE — ED Triage Notes (Signed)
Unwitnessed fall, arrives from [facility]. Negative for LOC, no anticoagulation therapy. Swelling above left eye, c/o headache.

## 2021-01-25 ENCOUNTER — Other Ambulatory Visit: Payer: Self-pay | Admitting: Physician Assistant

## 2021-01-25 ENCOUNTER — Ambulatory Visit
Admission: RE | Admit: 2021-01-25 | Discharge: 2021-01-25 | Disposition: A | Discharge status: Home / Self Care | Payer: Medicare Other | Source: Ambulatory Visit | Attending: Physician Assistant | Admitting: Physician Assistant

## 2021-01-25 ENCOUNTER — Other Ambulatory Visit: Payer: Self-pay

## 2021-01-25 ENCOUNTER — Other Ambulatory Visit (HOSPITAL_COMMUNITY): Payer: Self-pay | Admitting: Physician Assistant

## 2021-01-25 DIAGNOSIS — S0990XA Unspecified injury of head, initial encounter: Secondary | ICD-10-CM | POA: Insufficient documentation

## 2021-01-26 ENCOUNTER — Other Ambulatory Visit (HOSPITAL_COMMUNITY): Payer: Self-pay | Admitting: Physician Assistant

## 2021-01-26 ENCOUNTER — Other Ambulatory Visit: Payer: Self-pay | Admitting: Physician Assistant

## 2021-01-26 DIAGNOSIS — M545 Low back pain, unspecified: Secondary | ICD-10-CM

## 2021-02-08 ENCOUNTER — Ambulatory Visit
Admission: RE | Admit: 2021-02-08 | Discharge: 2021-02-08 | Disposition: A | Discharge status: Home / Self Care | Source: Ambulatory Visit | Attending: Physician Assistant | Admitting: Physician Assistant

## 2021-02-08 ENCOUNTER — Other Ambulatory Visit: Payer: Self-pay

## 2021-02-08 DIAGNOSIS — M545 Low back pain, unspecified: Secondary | ICD-10-CM | POA: Diagnosis not present

## 2021-02-08 IMAGING — MR MR LUMBAR SPINE W/O CM
4 of 5 series · 32 of 48 positions shown · non-contrast
Comparison: None.

CLINICAL DATA: Low back pain bilateral leg pain and weakness over
the last year.

EXAM:
MRI LUMBAR SPINE WITHOUT CONTRAST
TECHNIQUE: Multiplanar, multisequence MR imaging of the lumbar spine was
performed. No intravenous contrast was administered.

[Series 5: T2 · sagittal · 4.0mm · 0.81mm/px · 7 of 15 slices shown (1 of 2)]
[im 1/15]
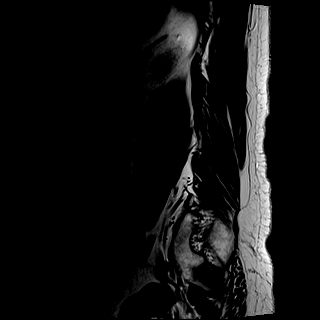
[im 3/15]
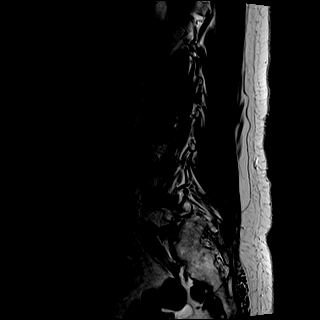
[im 5/15]
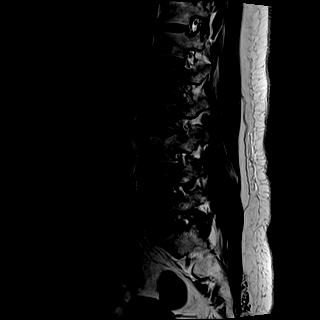
[im 8/15]
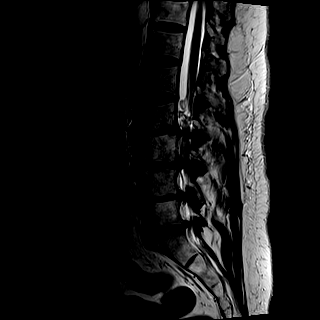
[im 10/15]
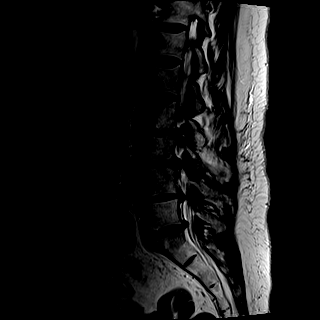
[im 12/15]
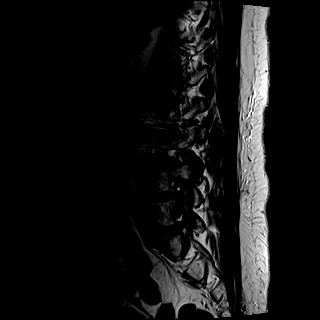
[im 15/15]
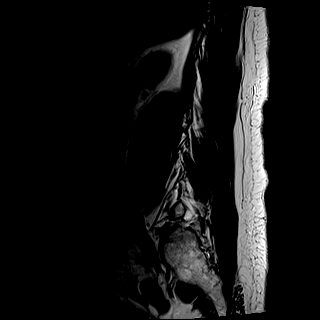

[Series 6: T1 · sagittal · 4.0mm · 0.81mm/px · 7 of 15 slices shown (1 of 2)]
[im 1/15]
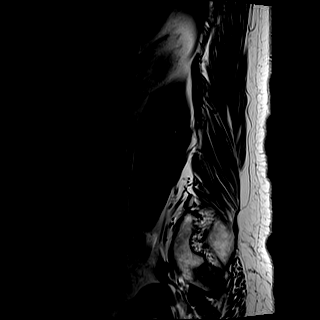
[im 3/15]
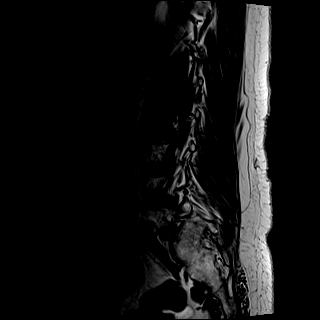
[im 5/15]
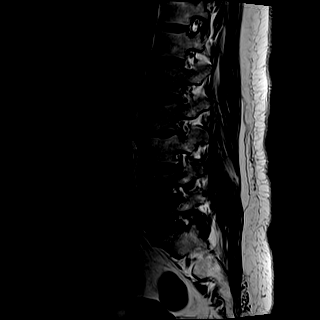
[im 8/15]
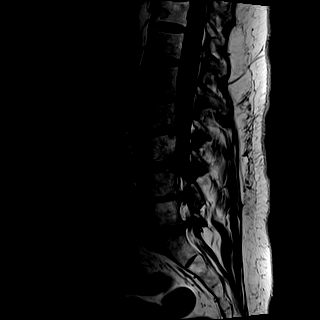
[im 10/15]
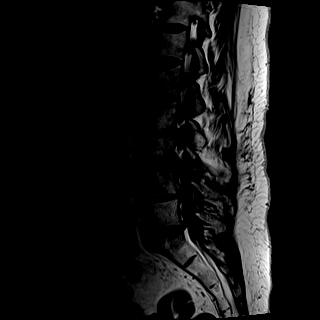
[im 12/15]
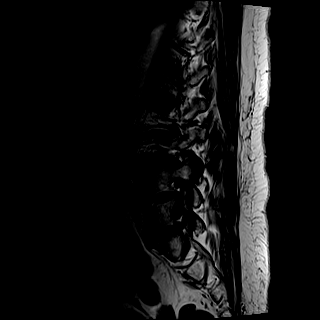
[im 15/15]
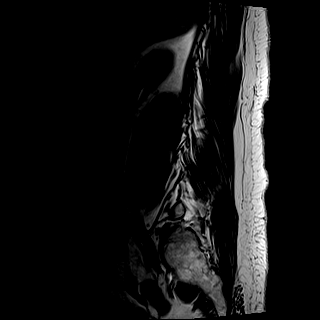

[Series 8: T2 · axial · 4.0mm · 0.78mm/px · z∈[-124,+21]mm · 9 of 25 slices shown (2 of 2)]
[im 1/25]
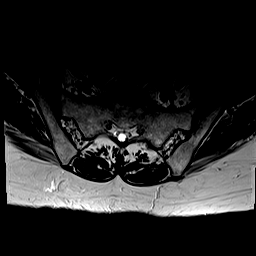
[im 5/25]
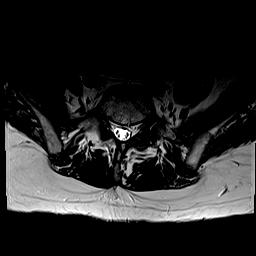
[im 9/25]
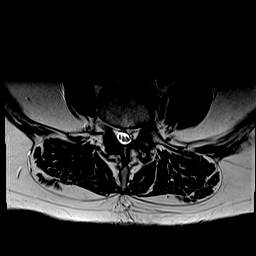
[im 11/25]
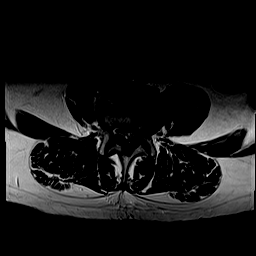
[im 13/25]
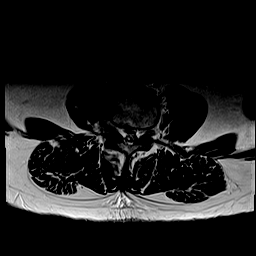
[im 15/25]
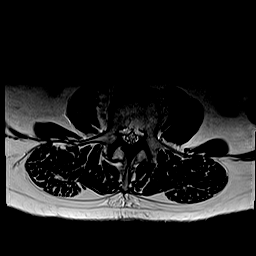
[im 17/25]
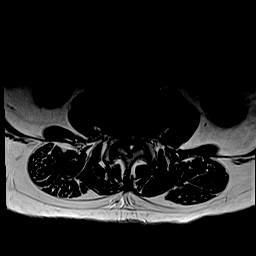
[im 21/25]
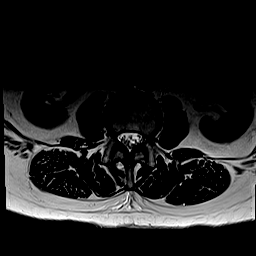
[im 25/25]
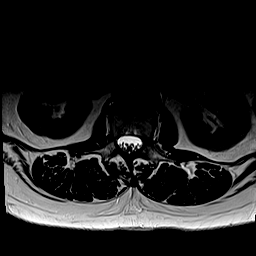

[Series 9: T1 · axial · 4.0mm · 0.39mm/px · z∈[-124,+21]mm · 9 of 25 slices shown (2 of 2)]
[im 1/25]
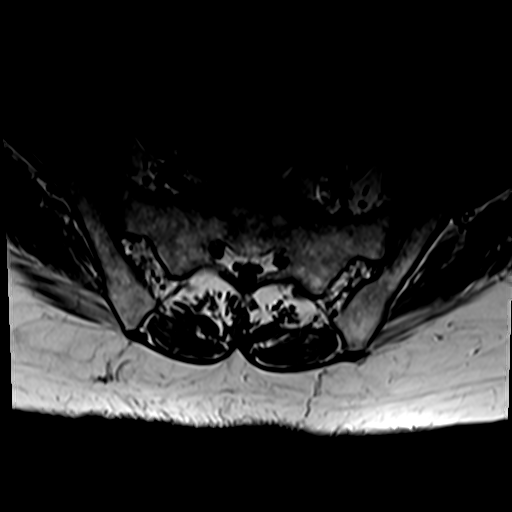
[im 5/25]
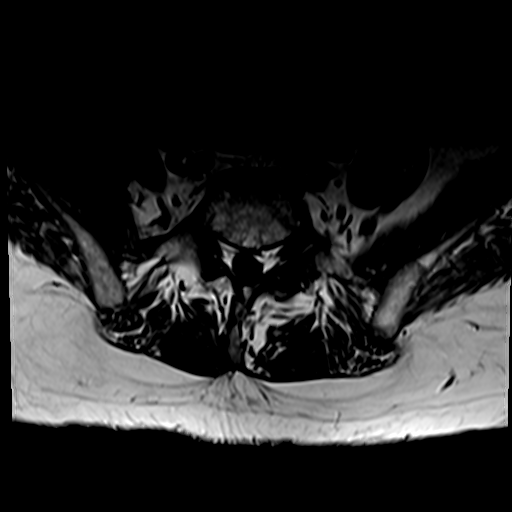
[im 9/25]
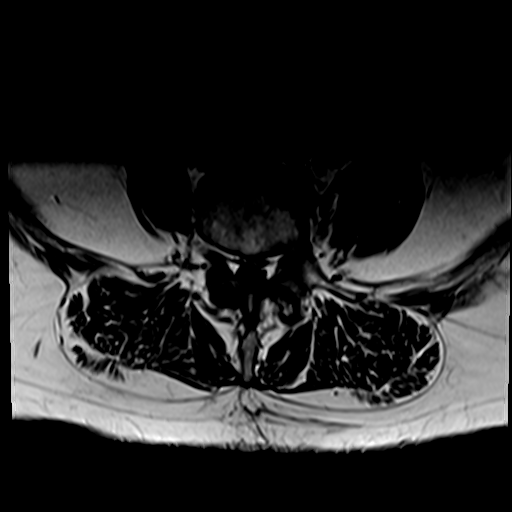
[im 11/25]
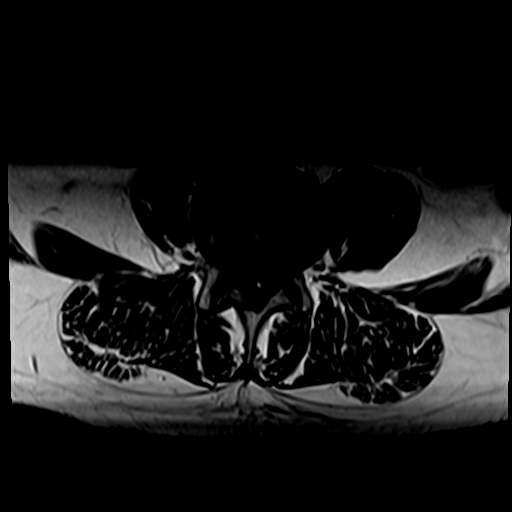
[im 13/25]
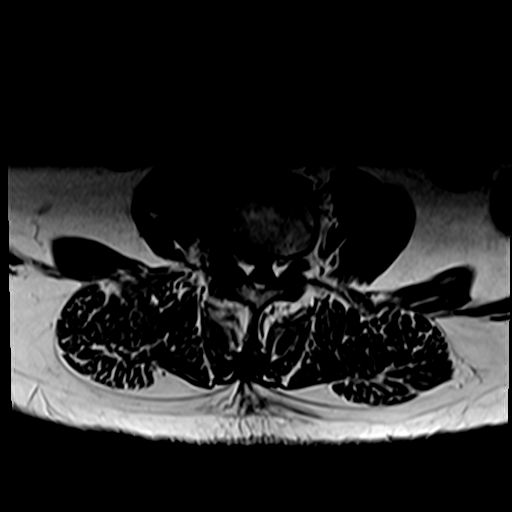
[im 15/25]
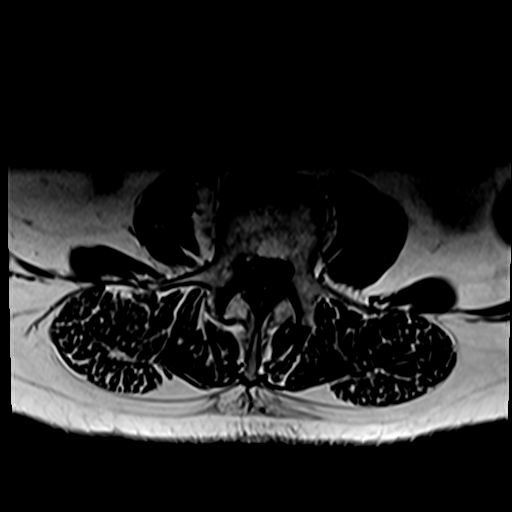
[im 17/25]
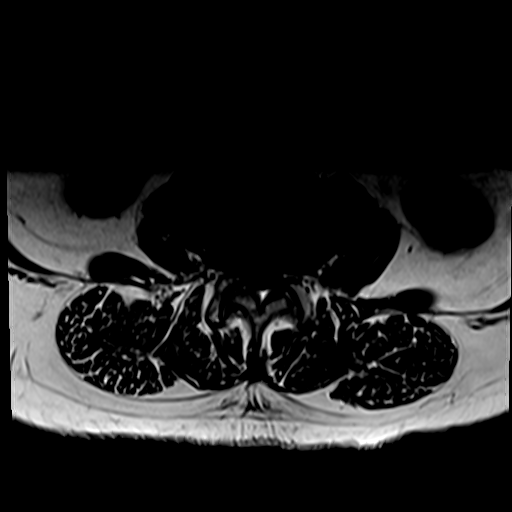
[im 21/25]
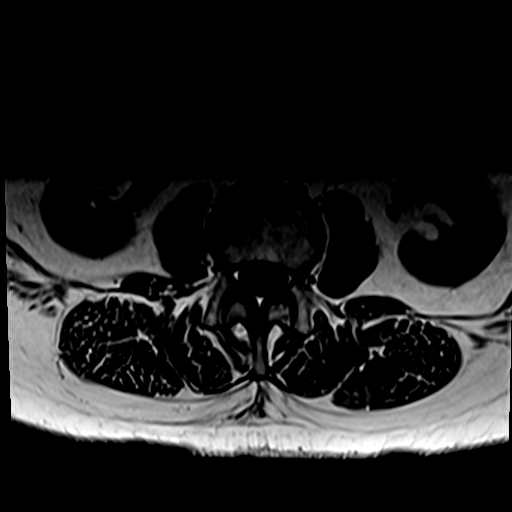
[im 25/25]
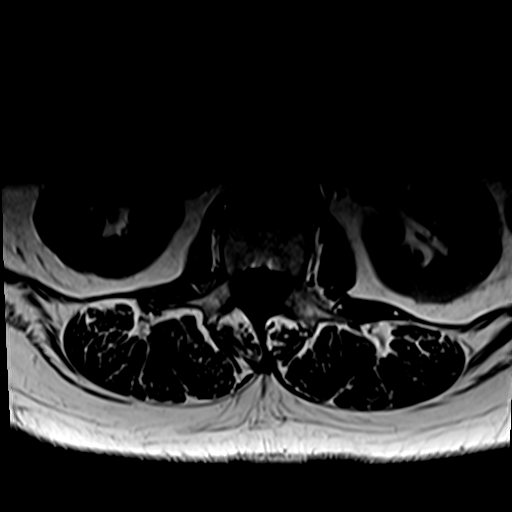

[32 of 48 positions shown; findings below may reference images not displayed]

FINDINGS: Segmentation:  5 lumbar type vertebral bodies.

Alignment: Straightening of the normal lumbar lordosis. Minimal
scoliotic curvature.

Vertebrae: No fracture or focal bone lesion. Some discogenic
endplate marrow edema at L2-3 and L3-4 could contribute to low back
pain.

Conus medullaris and cauda equina: Conus extends to the L1 level.
Conus and cauda equina appear normal.

Paraspinal and other soft tissues: Negative

Disc levels:

T11-12, T12-L1 and L1-2: Normal

L2-3: Chronic disc degeneration with loss of disc height. Endplate
osteophytes and bulging of the disc. Facet and ligamentous
hypertrophy. Moderate to severe multifactorial spinal stenosis at
this level that could be symptomatic.

L3-4: Chronic disc degeneration with loss of disc height. Endplate
osteophytes and bulging of the disc. Facet and ligamentous
hypertrophy. Moderate to severe multifactorial spinal stenosis at
this level that could be symptomatic.

L4-5: Endplate osteophytes and bulging of the disc. Findings worse
towards the left. Facet and ligamentous hypertrophy. Moderate
multifactorial spinal stenosis, most severe in the lateral recesses,
left more than right. Neural compression could occur at this level,
particularly on the left.

L5-S1: Endplate osteophytes and bulging of the disc. Facet and
ligamentous hypertrophy. Stenosis of the subarticular lateral
recesses and neural foramina that could possibly cause neural
compression, more likely on the left than the right.
IMPRESSION: L2-3 and L3-4: Moderate to severe multifactorial spinal stenosis at
these levels that could cause neural compression on either or both
sides.

L4-5: Moderate multifactorial stenosis, most pronounced in the left
lateral recess. Neural compression could occur on either side,
particularly the left.

L5-S1: Mild stenosis of the subarticular lateral recesses and neural
foramina, slightly worse on the left, potentially symptomatic.

## 2021-08-29 DEATH — deceased
